# Patient Record
Sex: Male | Born: 1945 | Race: Black or African American | Hispanic: No | Marital: Married | Smoking: Current every day smoker
Health system: Southern US, Community
[De-identification: ages and names within clinical notes are randomized; demographics above are authoritative.]

## PROBLEM LIST (undated history)

## (undated) DIAGNOSIS — H9190 Unspecified hearing loss, unspecified ear: Secondary | ICD-10-CM

## (undated) DIAGNOSIS — I82402 Acute embolism and thrombosis of unspecified deep veins of left lower extremity: Secondary | ICD-10-CM

## (undated) DIAGNOSIS — R1903 Right lower quadrant abdominal swelling, mass and lump: Secondary | ICD-10-CM

## (undated) DIAGNOSIS — E559 Vitamin D deficiency, unspecified: Secondary | ICD-10-CM

## (undated) DIAGNOSIS — I70219 Atherosclerosis of native arteries of extremities with intermittent claudication, unspecified extremity: Secondary | ICD-10-CM

## (undated) DIAGNOSIS — F17210 Nicotine dependence, cigarettes, uncomplicated: Secondary | ICD-10-CM

## (undated) DIAGNOSIS — E785 Hyperlipidemia, unspecified: Secondary | ICD-10-CM

## (undated) DIAGNOSIS — Z972 Presence of dental prosthetic device (complete) (partial): Secondary | ICD-10-CM

## (undated) DIAGNOSIS — I739 Peripheral vascular disease, unspecified: Secondary | ICD-10-CM

## (undated) DIAGNOSIS — D649 Anemia, unspecified: Secondary | ICD-10-CM

## (undated) DIAGNOSIS — R9431 Abnormal electrocardiogram [ECG] [EKG]: Secondary | ICD-10-CM

## (undated) HISTORY — DX: Peripheral vascular disease, unspecified: I73.9

## (undated) HISTORY — DX: Acute embolism and thrombosis of unspecified deep veins of left lower extremity: I82.402

## (undated) HISTORY — PX: EYE SURGERY: SHX253

## (undated) HISTORY — DX: Vitamin D deficiency, unspecified: E55.9

## (undated) HISTORY — DX: Unspecified hearing loss, unspecified ear: H91.90

## (undated) HISTORY — DX: Hyperlipidemia, unspecified: E78.5

---

## 1988-03-27 HISTORY — PX: BACK SURGERY: SHX140

## 2004-04-14 ENCOUNTER — Ambulatory Visit: Payer: Self-pay | Admitting: Family Medicine

## 2004-04-16 ENCOUNTER — Emergency Department: Payer: Self-pay | Admitting: Emergency Medicine

## 2004-06-14 ENCOUNTER — Ambulatory Visit: Payer: Self-pay | Admitting: Pain Medicine

## 2004-06-22 ENCOUNTER — Ambulatory Visit: Payer: Self-pay | Admitting: Pain Medicine

## 2004-07-22 ENCOUNTER — Emergency Department: Payer: Self-pay | Admitting: Emergency Medicine

## 2004-07-26 ENCOUNTER — Ambulatory Visit: Payer: Self-pay | Admitting: Pain Medicine

## 2004-08-08 ENCOUNTER — Ambulatory Visit: Payer: Self-pay | Admitting: Pain Medicine

## 2004-09-20 ENCOUNTER — Ambulatory Visit: Payer: Self-pay | Admitting: Pain Medicine

## 2004-09-28 ENCOUNTER — Ambulatory Visit: Payer: Self-pay | Admitting: Pain Medicine

## 2004-11-10 ENCOUNTER — Ambulatory Visit: Payer: Self-pay | Admitting: Pain Medicine

## 2004-11-16 ENCOUNTER — Ambulatory Visit: Payer: Self-pay | Admitting: Pain Medicine

## 2005-11-05 ENCOUNTER — Emergency Department: Payer: Self-pay | Admitting: Emergency Medicine

## 2005-11-05 ENCOUNTER — Other Ambulatory Visit: Payer: Self-pay

## 2007-03-28 HISTORY — PX: OTHER SURGICAL HISTORY: SHX169

## 2007-03-28 HISTORY — PX: VASCULAR SURGERY: SHX849

## 2007-09-23 ENCOUNTER — Emergency Department: Payer: Self-pay

## 2007-09-23 ENCOUNTER — Other Ambulatory Visit: Payer: Self-pay

## 2007-09-25 ENCOUNTER — Emergency Department: Payer: Self-pay | Admitting: Unknown Physician Specialty

## 2007-10-23 ENCOUNTER — Ambulatory Visit: Payer: Self-pay | Admitting: Vascular Surgery

## 2007-11-05 ENCOUNTER — Ambulatory Visit: Payer: Self-pay | Admitting: Vascular Surgery

## 2007-11-11 ENCOUNTER — Inpatient Hospital Stay: Payer: Self-pay | Admitting: Vascular Surgery

## 2008-09-15 ENCOUNTER — Emergency Department: Payer: Self-pay | Admitting: Emergency Medicine

## 2009-12-27 ENCOUNTER — Ambulatory Visit: Payer: Self-pay | Admitting: Vascular Surgery

## 2010-01-25 HISTORY — PX: COLONOSCOPY: SHX174

## 2010-01-27 ENCOUNTER — Ambulatory Visit: Payer: Self-pay | Admitting: Gastroenterology

## 2010-07-05 ENCOUNTER — Ambulatory Visit: Payer: Self-pay | Admitting: Gastroenterology

## 2011-07-18 ENCOUNTER — Emergency Department: Payer: Self-pay | Admitting: Emergency Medicine

## 2011-07-18 LAB — COMPREHENSIVE METABOLIC PANEL
Alkaline Phosphatase: 66 U/L (ref 50–136)
Anion Gap: 11 (ref 7–16)
Bilirubin,Total: 1.2 mg/dL — ABNORMAL HIGH (ref 0.2–1.0)
Chloride: 103 mmol/L (ref 98–107)
Co2: 25 mmol/L (ref 21–32)
SGOT(AST): 27 U/L (ref 15–37)
Sodium: 139 mmol/L (ref 136–145)

## 2011-07-18 LAB — URINALYSIS, COMPLETE
Bacteria: NONE SEEN
Bilirubin,UR: NEGATIVE
Glucose,UR: NEGATIVE mg/dL (ref 0–75)
Hyaline Cast: 31
Ketone: NEGATIVE
Leukocyte Esterase: NEGATIVE
Nitrite: NEGATIVE
Ph: 5 (ref 4.5–8.0)
Protein: 30
RBC,UR: 7 /HPF (ref 0–5)
Specific Gravity: 1.029 (ref 1.003–1.030)
WBC UR: 2 /HPF (ref 0–5)

## 2011-07-18 LAB — CBC
HCT: 53.1 % — ABNORMAL HIGH (ref 40.0–52.0)
HGB: 17.5 g/dL (ref 13.0–18.0)
MCH: 32.2 pg (ref 26.0–34.0)
MCHC: 33 g/dL (ref 32.0–36.0)
RBC: 5.45 10*6/uL (ref 4.40–5.90)
RDW: 13.7 % (ref 11.5–14.5)

## 2011-07-18 LAB — TROPONIN I: Troponin-I: <0.02 ng/mL

## 2011-07-18 LAB — CK TOTAL AND CKMB (NOT AT ARMC): CK, Total: 119 U/L (ref 35–232)

## 2014-12-14 ENCOUNTER — Other Ambulatory Visit: Payer: Self-pay | Admitting: Family Medicine

## 2014-12-14 DIAGNOSIS — T148XXA Other injury of unspecified body region, initial encounter: Secondary | ICD-10-CM

## 2017-06-14 DIAGNOSIS — I739 Peripheral vascular disease, unspecified: Secondary | ICD-10-CM | POA: Insufficient documentation

## 2017-06-18 ENCOUNTER — Encounter: Payer: Self-pay | Admitting: *Deleted

## 2017-06-26 ENCOUNTER — Ambulatory Visit: Payer: Self-pay | Admitting: General Surgery

## 2017-06-27 ENCOUNTER — Encounter: Payer: Self-pay | Admitting: *Deleted

## 2017-07-20 ENCOUNTER — Other Ambulatory Visit (INDEPENDENT_AMBULATORY_CARE_PROVIDER_SITE_OTHER): Payer: Self-pay | Admitting: Family Medicine

## 2017-07-20 DIAGNOSIS — I739 Peripheral vascular disease, unspecified: Secondary | ICD-10-CM

## 2017-07-24 ENCOUNTER — Encounter (INDEPENDENT_AMBULATORY_CARE_PROVIDER_SITE_OTHER): Payer: Medicare HMO

## 2017-07-24 ENCOUNTER — Encounter (INDEPENDENT_AMBULATORY_CARE_PROVIDER_SITE_OTHER): Payer: Medicare HMO | Admitting: Vascular Surgery

## 2017-08-25 HISTORY — PX: PTCA: SHX146

## 2017-09-04 ENCOUNTER — Ambulatory Visit (INDEPENDENT_AMBULATORY_CARE_PROVIDER_SITE_OTHER): Payer: Medicare HMO | Admitting: Vascular Surgery

## 2017-09-04 ENCOUNTER — Ambulatory Visit (INDEPENDENT_AMBULATORY_CARE_PROVIDER_SITE_OTHER): Payer: Medicare HMO

## 2017-09-04 ENCOUNTER — Other Ambulatory Visit (INDEPENDENT_AMBULATORY_CARE_PROVIDER_SITE_OTHER): Payer: Self-pay | Admitting: Vascular Surgery

## 2017-09-04 ENCOUNTER — Encounter (INDEPENDENT_AMBULATORY_CARE_PROVIDER_SITE_OTHER): Payer: Self-pay | Admitting: Vascular Surgery

## 2017-09-04 VITALS — BP 126/76 | HR 64 | Resp 12 | Ht 65.5 in | Wt 128.0 lb

## 2017-09-04 DIAGNOSIS — E785 Hyperlipidemia, unspecified: Secondary | ICD-10-CM | POA: Diagnosis not present

## 2017-09-04 DIAGNOSIS — F17210 Nicotine dependence, cigarettes, uncomplicated: Secondary | ICD-10-CM | POA: Diagnosis not present

## 2017-09-04 DIAGNOSIS — I70219 Atherosclerosis of native arteries of extremities with intermittent claudication, unspecified extremity: Secondary | ICD-10-CM

## 2017-09-04 DIAGNOSIS — I739 Peripheral vascular disease, unspecified: Secondary | ICD-10-CM | POA: Diagnosis not present

## 2017-09-04 DIAGNOSIS — F17200 Nicotine dependence, unspecified, uncomplicated: Secondary | ICD-10-CM | POA: Insufficient documentation

## 2017-09-04 NOTE — Assessment & Plan Note (Signed)
Statin was recommended by his primary care physician but he will not take them. lipid control important in reducing the progression of atherosclerotic disease.

## 2017-09-04 NOTE — Patient Instructions (Signed)

## 2017-09-04 NOTE — Progress Notes (Signed)
Patient ID: Jerry [name], male   DOB: 1945/12/27, 72 y.o.   MRN: [id number]  Chief Complaint  Patient presents with  . New Patient (Initial Visit)    Claudication, ABI Consult    HPI Jerry [name] is a 72 y.o. male.  I am asked to see the patient by [name] for evaluation of PAD.  The patient reports worsening left lower extremity claudication symptoms over several months.  He says he had this problem 5 to 10 years ago and had surgery which relieved his left leg symptoms at that time.  He does continue to smoke.  He does not describe rest pain or ulceration currently.  His pain comes on with short distances of walking and is generally relieved with rest.  No right leg symptoms.  There is no clear inciting event or causative factor that seem to worsen his symptoms but they have clearly worsened over the past several months.  Nothing is really making them better.   Past Medical History:  Diagnosis Date  . Hearing loss   . Hyperlipidemia   . Peripheral vascular disease ([place])   . Vitamin D deficiency     PSH Fem fem bypass 5-10 years ago Port placement  Family History No bleeding disorders, clotting disorders, autoimmune diseases, or aneurysms.  Social History Social History   Tobacco Use  . Smoking status: Current Every Day Smoker  . Smokeless tobacco: Never Used  Substance Use Topics  . Alcohol use: Never    Frequency: Never  . Drug use: Not on file    No Known Allergies  No current outpatient medications on file.   No current facility-administered medications for this visit.   Patient says he won't take any medications.  Says they all cause side effects and "mess him up"    REVIEW OF SYSTEMS (Negative unless checked)  Constitutional: [] Weight loss  [] Fever  [] Chills Cardiac: [] Chest pain   [] Chest pressure   [] Palpitations   [] Shortness of breath when laying flat   [] Shortness of breath at rest   [] Shortness of breath with exertion. Vascular:  [x] Pain in legs  with walking   [] Pain in legs at rest   [] Pain in legs when laying flat   [x] Claudication   [] Pain in feet when walking  [] Pain in feet at rest  [] Pain in feet when laying flat   [] History of DVT   [] Phlebitis   [] Swelling in legs   [] Varicose veins   [] Non-healing ulcers Pulmonary:   [] Uses home oxygen   [] Productive cough   [] Hemoptysis   [] Wheeze  [] COPD   [] Asthma Neurologic:  [] Dizziness  [] Blackouts   [] Seizures   [] History of stroke   [] History of TIA  [] Aphasia   [] Temporary blindness   [] Dysphagia   [] Weakness or numbness in arms   [] Weakness or numbness in legs Musculoskeletal:  [x] Arthritis   [] Joint swelling   [x] Joint pain   [] Low back pain Hematologic:  [] Easy bruising  [] Easy bleeding   [] Hypercoagulable state   [] Anemic  [] Hepatitis Gastrointestinal:  [] Blood in stool   [] Vomiting blood  [] Gastroesophageal reflux/heartburn   [] Abdominal pain Genitourinary:  [] Chronic kidney disease   [] Difficult urination  [] Frequent urination  [] Burning with urination   [] Hematuria Skin:  [] Rashes   [] Ulcers   [] Wounds Psychological:  [] History of anxiety   []  History of major depression.    Physical Exam BP 126/76 (BP Location: Right Arm, Patient Position: Sitting)   Pulse 64   Resp 12  Ht 5' 5.5" (1.664 m)   Wt 128 lb (58.1 kg)   BMI 20.98 kg/m  Gen:  WD/WN, NAD Head: [place]/AT, No temporalis wasting.  Ear/Nose/Throat: Hearing decreased, nares w/o erythema or drainage, oropharynx w/o Erythema/Exudate Eyes: Conjunctiva clear, sclera non-icteric  Neck: trachea midline.  Pulmonary:  Good air movement, respirations not labored, no use of accessory muscles Cardiac: RRR, no JVD Vascular:  Vessel Right Left  Radial Palpable Palpable                  Femoral Palpable Not Palpable  Popliteal Palpable Not Palpable  PT Palpable 1+ Palpable  DP Palpable Trace Palpable   Gastrointestinal: soft, non-tender/non-distended.  Musculoskeletal: M/S 5/5 throughout.  No deformity or atrophy. Trace  LLE edema. Neurologic: Sensation grossly intact in extremities.  Symmetrical.  Speech is fluent. Motor exam as listed above. Psychiatric: Judgment intact, Mood & affect appropriate for pt's clinical situation. Dermatologic: No rashes or ulcers noted.  No cellulitis or open wounds.  Radiology No results found.  Labs No results found for this or any previous visit (from the past 2160 hour(s)).  Assessment/Plan:  Hyperlipidemia Statin was recommended by his primary care physician but he will not take them. lipid control important in reducing the progression of atherosclerotic disease.    Tobacco use disorder We had a discussion for approximately 3 minutes regarding the absolute need for smoking cessation due to the deleterious nature of tobacco on the vascular system. We discussed the tobacco use would diminish patency of any intervention, and likely significantly worsen progressio of disease. We discussed multiple agents for quitting including replacement therapy or medications to reduce cravings such as Chantix. The patient voices their understanding of the importance of smoking cessation.   Atherosclerosis of lower extremity with claudication [facility]) The patient describes lifestyle limiting claudication of the left lower extremity.  We discussed that he is not in the immediate limb threat and does not have immediate limb threatening symptoms.  He is studied with noninvasive studies today which show a normal ABI of 1 on the right and a reduced ABI on the 0.7 range on the left with monophasic waveforms.  A spot duplex shows his femoral to femoral bypass is occluded as is likely his left iliac system.  I had a long discussion with the patient today.  I discussed that if we do any intervention, he will be needed to take medications for at least several months and should be on an aspirin lifelong.  I recommended smoking cessation which he does not seem interested in.  I have discussed options for  treatment and he would like to have an angiogram for further evaluation as well as potential revascularization.  I discussed the risks and benefits of the procedures.  I have discussed that intervention could actually make his symptoms worse if it reoccludes.  He voices his understanding but desires to proceed with evaluation for revascularization given the severe nature of his symptoms which is reasonable.      [name] 09/04/2017, 5:03 PM   This note was created with Dragon medical transcription system.  Any errors from dictation are unintentional.

## 2017-09-04 NOTE — Assessment & Plan Note (Signed)
The patient describes lifestyle limiting claudication of the left lower extremity.  We discussed that he is not in the immediate limb threat and does not have immediate limb threatening symptoms.  He is studied with noninvasive studies today which show a normal ABI of 1 on the right and a reduced ABI on the 0.7 range on the left with monophasic waveforms.  A spot duplex shows his femoral to femoral bypass is occluded as is likely his left iliac system.  I had a long discussion with the patient today.  I discussed that if we do any intervention, he will be needed to take medications for at least several months and should be on an aspirin lifelong.  I recommended smoking cessation which he does not seem interested in.  I have discussed options for treatment and he would like to have an angiogram for further evaluation as well as potential revascularization.  I discussed the risks and benefits of the procedures.  I have discussed that intervention could actually make his symptoms worse if it reoccludes.  He voices his understanding but desires to proceed with evaluation for revascularization given the severe nature of his symptoms which is reasonable.

## 2017-09-04 NOTE — Assessment & Plan Note (Signed)

## 2017-09-07 ENCOUNTER — Encounter (INDEPENDENT_AMBULATORY_CARE_PROVIDER_SITE_OTHER): Payer: Self-pay

## 2017-09-10 ENCOUNTER — Other Ambulatory Visit (INDEPENDENT_AMBULATORY_CARE_PROVIDER_SITE_OTHER): Payer: Self-pay | Admitting: Vascular Surgery

## 2017-09-13 ENCOUNTER — Encounter: Payer: Self-pay | Admitting: General Surgery

## 2017-09-13 ENCOUNTER — Ambulatory Visit (INDEPENDENT_AMBULATORY_CARE_PROVIDER_SITE_OTHER): Payer: Medicare HMO | Admitting: General Surgery

## 2017-09-13 VITALS — BP 110/78 | HR 80 | Resp 15 | Ht 65.5 in | Wt 127.0 lb

## 2017-09-13 DIAGNOSIS — R1903 Right lower quadrant abdominal swelling, mass and lump: Secondary | ICD-10-CM | POA: Diagnosis not present

## 2017-09-13 NOTE — Progress Notes (Signed)
Patient ID: Jerry [name], male   DOB: [birth year], 72 y.o.   MRN: [id number]  Chief Complaint  Patient presents with  . Abdominal Pain    HPI KEISHAUN [name] is a 72 y.o. male.  Here today for evaluation of an right abdominal knot referred by Dr Delight Stare. He has noticed the knot a few months ago. He states he only has pain when it pushes out. He states it is about the size of an egg, he rubs the area and it goes down. No particular incident when he noticed the knot. He states the knot was there on his way to the appointment but now it is gone. Bowels move every other day, no bleeding. He states his appetite is less than it was. He may eat just one meal a day. He noticed this about the same time he noticed the knot.  HPI  Past Medical History:  Diagnosis Date  . Hearing loss   . Hyperlipidemia   . Peripheral vascular disease ([place])   . Vitamin D deficiency     Past Surgical History:  Procedure Laterality Date  . BACK SURGERY     Three  . COLONOSCOPY  01/2010  . Left Leg stent Left 2017?   fem fem bypass    Family History  Problem Relation Age of Onset  . Colon cancer Neg Hx     Social History Social History   Tobacco Use  . Smoking status: Current Every Day Smoker    Packs/day: 0.50    Years: 40.00    Pack years: 20.00    Types: Cigarettes  . Smokeless tobacco: Never Used  Substance Use Topics  . Alcohol use: Never    Frequency: Never  . Drug use: Not on file    No Known Allergies  Current Outpatient Medications  Medication Sig Dispense Refill  . aspirin EC 81 MG tablet Take 81 mg by mouth daily.    . Multiple Vitamin (MULTIVITAMIN) capsule Take 1 capsule by mouth as needed.      No current facility-administered medications for this visit.     Review of Systems Review of Systems  Constitutional: Negative.   Respiratory: Negative.   Cardiovascular: Negative.     Blood pressure 110/78, pulse 80, resp. rate 15, height 5' 5.5" (1.664 m), weight 127 lb (57.6  kg). No weight change between March and June 2019.  Physical Exam Physical Exam  Constitutional: He is oriented to person, place, and time. He appears well-developed and well-nourished.  HENT:  Mouth/Throat: No oropharyngeal exudate.  Eyes: Conjunctivae are normal. No scleral icterus.  Neck: Neck supple.  Cardiovascular: Normal rate and regular rhythm.  Murmur heard.  Systolic murmur is present with a grade of 3/6. Pulmonary/Chest: Effort normal and breath sounds normal.  Abdominal: Soft. Normal appearance and bowel sounds are normal. He exhibits no distension and no ascites.    Neurological: He is alert and oriented to person, place, and time.  Skin: Skin is warm and dry.  Psychiatric: His behavior is normal.    Data Reviewed Laboratory studies dated June 14, 2017 showed a creatinine of 1.18 with an estimated GFR of 70.  Normal electrolytes.  High serum albumin of 5.1.  Total bilirubin 1.3.  Alkaline phosphatase and transaminases normal.  Normal TSH at 0.87.  PSA: 3.0.  Assessment    Unusual report of abdominal mass waxing and waning in location atypical for hernia formation.  Within the limits of exam, I cannot appreciate an intra-abdominal mass,  but this could be obscured as he still has significant muscle mass on the anterior abdominal wall.    Plan    CT abdomen pelvis with contrast will be requested to help will be requested to help determine if any intra-abdominal process is present to account for the patient's present reported physical changes.    HPI, Physical Exam, Assessment and Plan have been scribed under the direction and in the presence of Robert Bellow, MD. Karie Fetch, RN  I have completed the exam and reviewed the above documentation for accuracy and completeness.  I agree with the above.  Haematologist has been used and any errors in dictation or transcription are unintentional.  Hervey Ard, M.D., F.A.C.S.  The patient is scheduled for a CT of  the abdomen and pelvis on 09/20/17 at 9:30 am at [facility]. He will pick up a prep kit prior to that. He is to arrive by 9:15 am and have nothing to eat or drink for 4 hours prior. The patient is aware of date, time,and instructions.  Documented by Caryl-Lyn Otis Brace LPN  Forest Gleason Emanuele Mcwhirter 09/14/2017, 10:07 AM

## 2017-09-13 NOTE — Patient Instructions (Addendum)
CT abdomen and pelvis with contrast scan The patient is aware to call back for any questions or concerns.  The patient is scheduled for a CT of the abdomen and pelvis on 09/20/17 at 9:30 am at [facility]. He will pick up a prep kit prior to that. He is to arrive by 9:15 am and have nothing to eat or drink for 4 hours prior. The patient is aware of date, time,and instructions.

## 2017-09-14 DIAGNOSIS — R634 Abnormal weight loss: Secondary | ICD-10-CM | POA: Insufficient documentation

## 2017-09-14 DIAGNOSIS — R1903 Right lower quadrant abdominal swelling, mass and lump: Secondary | ICD-10-CM | POA: Insufficient documentation

## 2017-09-17 ENCOUNTER — Inpatient Hospital Stay: Admission: RE | Admit: 2017-09-17 | Payer: Medicare HMO | Source: Ambulatory Visit

## 2017-09-19 ENCOUNTER — Other Ambulatory Visit
Admission: RE | Admit: 2017-09-19 | Discharge: 2017-09-19 | Disposition: A | Discharge status: Home / Self Care | Payer: Medicare HMO | Source: Ambulatory Visit | Attending: Vascular Surgery | Admitting: Vascular Surgery

## 2017-09-19 ENCOUNTER — Ambulatory Visit
Admission: RE | Admit: 2017-09-19 | Discharge: 2017-09-19 | Disposition: A | Discharge status: Home / Self Care | Payer: Medicare HMO | Source: Ambulatory Visit | Attending: Vascular Surgery | Admitting: Vascular Surgery

## 2017-09-19 ENCOUNTER — Encounter
Admission: RE | Disposition: A | Discharge status: Home / Self Care | Payer: Self-pay | Source: Ambulatory Visit | Attending: Vascular Surgery

## 2017-09-19 DIAGNOSIS — H9190 Unspecified hearing loss, unspecified ear: Secondary | ICD-10-CM | POA: Insufficient documentation

## 2017-09-19 DIAGNOSIS — I70219 Atherosclerosis of native arteries of extremities with intermittent claudication, unspecified extremity: Secondary | ICD-10-CM

## 2017-09-19 DIAGNOSIS — Z9889 Other specified postprocedural states: Secondary | ICD-10-CM | POA: Diagnosis not present

## 2017-09-19 DIAGNOSIS — E559 Vitamin D deficiency, unspecified: Secondary | ICD-10-CM | POA: Insufficient documentation

## 2017-09-19 DIAGNOSIS — I739 Peripheral vascular disease, unspecified: Secondary | ICD-10-CM | POA: Diagnosis not present

## 2017-09-19 DIAGNOSIS — E785 Hyperlipidemia, unspecified: Secondary | ICD-10-CM | POA: Insufficient documentation

## 2017-09-19 DIAGNOSIS — Z01812 Encounter for preprocedural laboratory examination: Secondary | ICD-10-CM | POA: Diagnosis present

## 2017-09-19 DIAGNOSIS — I70212 Atherosclerosis of native arteries of extremities with intermittent claudication, left leg: Secondary | ICD-10-CM | POA: Diagnosis not present

## 2017-09-19 DIAGNOSIS — F17200 Nicotine dependence, unspecified, uncomplicated: Secondary | ICD-10-CM | POA: Diagnosis not present

## 2017-09-19 HISTORY — PX: LOWER EXTREMITY ANGIOGRAPHY: CATH118251

## 2017-09-19 LAB — CREATININE, SERUM
Creatinine, Ser: 1 mg/dL (ref 0.61–1.24)
GFR calc Af Amer: >60 mL/min (ref >60)
GFR calc non Af Amer: >60 mL/min (ref >60)

## 2017-09-19 LAB — BUN: BUN: 20 mg/dL (ref 8–23)

## 2017-09-19 SURGERY — LOWER EXTREMITY ANGIOGRAPHY
Anesthesia: Moderate Sedation | Laterality: Left

## 2017-09-19 MED ORDER — METHYLPREDNISOLONE SODIUM SUCC 125 MG IJ SOLR: 125.0000 mg | INTRAMUSCULAR | Status: DC | PRN

## 2017-09-19 MED ORDER — FENTANYL CITRATE (PF) 100 MCG/2ML IJ SOLN
INTRAMUSCULAR | Status: AC
  Filled 2017-09-19: qty 2

## 2017-09-19 MED ORDER — HEPARIN (PORCINE) IN NACL 1000-0.9 UT/500ML-% IV SOLN
INTRAVENOUS | Status: AC
  Filled 2017-09-19: qty 1000

## 2017-09-19 MED ORDER — ONDANSETRON HCL 4 MG/2ML IJ SOLN: 4.0000 mg | Freq: Four times a day (QID) | INTRAMUSCULAR | Status: DC | PRN

## 2017-09-19 MED ORDER — LIDOCAINE-EPINEPHRINE (PF) 1 %-1:200000 IJ SOLN
INTRAMUSCULAR | Status: AC
  Filled 2017-09-19: qty 30

## 2017-09-19 MED ORDER — SODIUM CHLORIDE 0.9% FLUSH: 3.0000 mL | INTRAVENOUS | Status: DC | PRN

## 2017-09-19 MED ORDER — SODIUM CHLORIDE 0.9 % IV SOLN: INTRAVENOUS | Status: DC

## 2017-09-19 MED ORDER — HEPARIN SODIUM (PORCINE) 1000 UNIT/ML IJ SOLN
INTRAMUSCULAR | Status: AC
  Filled 2017-09-19: qty 1

## 2017-09-19 MED ORDER — HYDRALAZINE HCL 20 MG/ML IJ SOLN: 5.0000 mg | INTRAMUSCULAR | Status: DC | PRN

## 2017-09-19 MED ORDER — HYDROMORPHONE HCL 1 MG/ML IJ SOLN: 1.0000 mg | Freq: Once | INTRAMUSCULAR | Status: DC | PRN

## 2017-09-19 MED ORDER — MIDAZOLAM HCL 5 MG/5ML IJ SOLN
INTRAMUSCULAR | Status: AC
  Filled 2017-09-19: qty 5

## 2017-09-19 MED ORDER — ACETAMINOPHEN 325 MG PO TABS: 650.0000 mg | ORAL_TABLET | ORAL | Status: DC | PRN

## 2017-09-19 MED ORDER — IOPAMIDOL (ISOVUE-300) INJECTION 61%
INTRAVENOUS | Status: DC | PRN
  Administered 2017-09-19: 80 mL via INTRA_ARTERIAL

## 2017-09-19 MED ORDER — SODIUM CHLORIDE 0.9% FLUSH: 3.0000 mL | Freq: Two times a day (BID) | INTRAVENOUS | Status: DC

## 2017-09-19 MED ORDER — FAMOTIDINE 20 MG PO TABS: 40.0000 mg | ORAL_TABLET | ORAL | Status: DC | PRN

## 2017-09-19 MED ORDER — CEFAZOLIN SODIUM-DEXTROSE 2-4 GM/100ML-% IV SOLN
2.0000 g | Freq: Once | INTRAVENOUS | Status: AC
  Administered 2017-09-19: 2 g via INTRAVENOUS

## 2017-09-19 MED ORDER — CEFAZOLIN SODIUM-DEXTROSE 2-4 GM/100ML-% IV SOLN
INTRAVENOUS | Status: AC
  Administered 2017-09-19: 2 g via INTRAVENOUS
  Filled 2017-09-19: qty 100

## 2017-09-19 MED ORDER — ATORVASTATIN CALCIUM 10 MG PO TABS: 10.0000 mg | ORAL_TABLET | Freq: Every day | ORAL | 11 refills | Status: DC

## 2017-09-19 MED ORDER — SODIUM CHLORIDE 0.9 % IV SOLN: 250.0000 mL | INTRAVENOUS | Status: DC | PRN

## 2017-09-19 MED ORDER — HEPARIN SODIUM (PORCINE) 1000 UNIT/ML IJ SOLN
INTRAMUSCULAR | Status: DC | PRN
  Administered 2017-09-19: 4000 [IU] via INTRAVENOUS

## 2017-09-19 MED ORDER — MIDAZOLAM HCL 2 MG/2ML IJ SOLN
INTRAMUSCULAR | Status: DC | PRN
  Administered 2017-09-19: 1 mg via INTRAVENOUS
  Administered 2017-09-19: 2 mg via INTRAVENOUS
  Administered 2017-09-19: 1 mg via INTRAVENOUS

## 2017-09-19 MED ORDER — LABETALOL HCL 5 MG/ML IV SOLN: 10.0000 mg | INTRAVENOUS | Status: DC | PRN

## 2017-09-19 MED ORDER — SODIUM CHLORIDE 0.9 % IV SOLN
INTRAVENOUS | Status: DC
  Administered 2017-09-19: 12:00:00 via INTRAVENOUS

## 2017-09-19 MED ORDER — FENTANYL CITRATE (PF) 100 MCG/2ML IJ SOLN
INTRAMUSCULAR | Status: DC | PRN
  Administered 2017-09-19 (×3): 50 ug via INTRAVENOUS

## 2017-09-19 SURGICAL SUPPLY — 16 items
BALLN LUTONIX AV 6X60X75 (BALLOONS) ×2
BALLOON LUTONIX AV 6X60X75 (BALLOONS) IMPLANT
CANNULA 5F STIFF (CANNULA) ×1 IMPLANT
CATH BEACON 5 .035 65 KMP TIP (CATHETERS) ×1 IMPLANT
CATH BEACON 5 .035 65 RIM TIP (CATHETERS) ×1 IMPLANT
CATH PIG 70CM (CATHETERS) ×1 IMPLANT
CATH VS15FR (CATHETERS) ×1 IMPLANT
DEVICE PRESTO INFLATION (MISCELLANEOUS) ×1 IMPLANT
DEVICE STARCLOSE SE CLOSURE (Vascular Products) ×1 IMPLANT
DEVICE TORQUE .025-.038 (MISCELLANEOUS) ×1 IMPLANT
GLIDEWIRE ADV .035X180CM (WIRE) ×1 IMPLANT
PACK ANGIOGRAPHY (CUSTOM PROCEDURE TRAY) ×2 IMPLANT
SHEATH BRITE TIP 5FRX11 (SHEATH) ×1 IMPLANT
SYR MEDRAD MARK V 150ML (SYRINGE) ×1 IMPLANT
TUBING CONTRAST HIGH PRESS 72 (TUBING) ×1 IMPLANT
WIRE J 3MM .035X145CM (WIRE) ×1 IMPLANT

## 2017-09-19 NOTE — Op Note (Signed)
[facility]  Percutaneous Study/Intervention Procedural Note   Date of Surgery: 09/19/2017  Surgeon(s):Kemontae Dunklee    Assistants:none  Pre-operative Diagnosis: PAD with claudication predominantly in the left lower extremity  Post-operative diagnosis:  Same  Procedure(s) Performed:             1.  Ultrasound guidance for vascular access right femoral artery             2.  Catheter placement into left internal and external iliac arteries from right femoral approach             3.  Aortogram and selective left lower extremity angiogram             4.  Percutaneous transluminal angioplasty of right external iliac artery with 6 mm diameter by 6 cm length Lutonix drug-coated angioplasty balloon             5.  StarClose closure device right femoral artery  EBL: 10 cc  Contrast: 80 cc  Fluoro Time: 17.3 minutes  Moderate Conscious Sedation Time: approximately 50 minutes using 4 mg of Versed and 150 Mcg of Fentanyl              Indications:  Patient is a 72 y.o.male with a long history of peripheral arterial disease.  He had a previous femoral to femoral bypass several years ago.  He now has recurrent lifestyle limiting left leg claudication. The patient has noninvasive study showing occlusion of his femoral to femoral bypass with significantly reduced left ABI. The patient is brought in for angiography for further evaluation and potential treatment.  Risks and benefits are discussed and informed consent is obtained.   Procedure:  The patient was identified and appropriate procedural time out was performed.  The patient was then placed supine on the table and prepped and draped in the usual sterile fashion. Moderate conscious sedation was administered during a face to face encounter with the patient throughout the procedure with my supervision of the RN administering medicines and monitoring the patient's vital signs, pulse oximetry, telemetry and mental status  throughout from the start of the procedure until the patient was taken to the recovery room. Ultrasound was used to evaluate the right common femoral artery.  It was patent .  A digital ultrasound image was acquired.  A Seldinger needle was used to access the right common femoral artery under direct ultrasound guidance and a permanent image was performed.  A 0.035 J wire was advanced without resistance and a 5Fr sheath was placed.  Pigtail catheter was placed into the aorta and an AP aortogram was performed.  Pelvic obliques were also performed to help opacify the iliacs particularly given his spinal hardware making imaging difficult.  This demonstrated normal renal arteries with a very calcified but not stenotic aorta.  The right common iliac artery had mild to moderate stenosis approaching 50%.  The right external iliac artery had about a 70 to 75% stenosis.  There also appeared to be significant disease at the right femoral bifurcation both in the proximal SFA and profunda femoris artery just below the access site at the femoral bifurcation.  On the left, the left common iliac artery was heavily calcific but not stenotic.  The left internal iliac artery was large providing collaterals around an occlusion of the left external iliac artery.  This did reconstitute a diseased left common femoral artery which appeared highly stenotic at and just above the bifurcation of the left femoral artery.  I then went ahead and image the left lower extremity from this approach.  This demonstrated significant disease of the left common femoral artery that appeared to be greater than 80% at and just above the femoral bifurcation.  The SFA was then reasonably normal after its origin as was the popliteal artery.  There appeared to be two-vessel runoff distally with the posterior tibial artery being the best vessel although distal opacification was quite poor due to the slow flow time of the patient's continuous motion.  The patient  was given 4000 units of intravenous heparin.  I then elected to perform angioplasty of the right external iliac artery to treat this lesion as well as to improve her chances of getting up and over the bifurcation to treat the left external iliac occlusion.  A 6 mm diameter by 6 cm length Lutonix drug-coated angioplasty balloon was inflated to 10 atm for 1 minute and the right external iliac artery.  Completion angiogram showed a 40 to 50% residual stenosis.  I then used multiple catheters and wires attempting to cross the left iliac occlusion.  I used a rim catheter, a VS 1 catheter, and a Kumpe catheter.  Each time, catheters would easily gain access to the left internal iliac artery to get purchase, but I was unable to go beyond the first 1 to 2 cm of the left external iliac artery and get through the occlusion.  The catheters were buckled back and this was a chronic, densely calcified occlusion.  At this point it was reasonably clear that this was not going to be a successful endeavor and crossing the left iliac lesion from the right side.  In addition, the left common femoral disease would need to be addressed and this would be best managed surgically.  Consideration for a left femoral endarterectomy with either a redo femoral to femoral bypass or a left iliac intervention would be planned going forward.  I elected to terminate the procedure. The sheath was removed and StarClose closure device was deployed in the right femoral artery with excellent hemostatic result. The patient was taken to the recovery room in stable condition having tolerated the procedure well.  Findings:               Aortogram:  This demonstrated normal renal arteries with a very calcified but not stenotic aorta.  The right common iliac artery had mild to moderate stenosis approaching 50%.  The right external iliac artery had about a 70 to 75% stenosis.  There also appeared to be significant disease at the right femoral bifurcation both  in the proximal SFA and profunda femoris artery just below the access site at the femoral bifurcation.  On the left, the left common iliac artery was heavily calcific but not stenotic.  The left internal iliac artery was large providing collaterals around an occlusion of the left external iliac artery.  This did reconstitute a diseased left common femoral artery which appeared highly stenotic at and just above the bifurcation of the left femoral artery.             Left lower Extremity:  Significant disease of the left common femoral artery that appeared to be greater than 80% at and just above the femoral bifurcation.  The SFA was then reasonably normal after its origin as was the popliteal artery.  There appeared to be two-vessel runoff distally with the posterior tibial artery being the best vessel although distal opacification was quite poor due to the slow  flow time of the patient's continuous motion   Disposition: Patient was taken to the recovery room in stable condition having tolerated the procedure well.  Complications: None  Jerry [name] 09/19/2017 2:34 PM   This note was created with Dragon Medical transcription system. Any errors in dictation are purely unintentional.

## 2017-09-19 NOTE — Progress Notes (Signed)
Dr. Lucky Cowboy by pt. Bedside to speak with pt. And spouse re: results of procedure. Both verbalized understanding. Message left with office to schedule return office appt. For pt.

## 2017-09-20 ENCOUNTER — Encounter: Payer: Self-pay | Admitting: Vascular Surgery

## 2017-09-20 ENCOUNTER — Ambulatory Visit: Payer: Medicare HMO

## 2017-09-20 ENCOUNTER — Encounter (INDEPENDENT_AMBULATORY_CARE_PROVIDER_SITE_OTHER): Payer: Self-pay

## 2017-09-24 ENCOUNTER — Other Ambulatory Visit (INDEPENDENT_AMBULATORY_CARE_PROVIDER_SITE_OTHER): Payer: Self-pay | Admitting: Vascular Surgery

## 2017-09-25 ENCOUNTER — Other Ambulatory Visit: Payer: Self-pay

## 2017-09-25 ENCOUNTER — Encounter
Admission: RE | Admit: 2017-09-25 | Discharge: 2017-09-25 | Disposition: A | Discharge status: Home / Self Care | Payer: Medicare HMO | Source: Ambulatory Visit | Attending: Vascular Surgery | Admitting: Vascular Surgery

## 2017-09-25 DIAGNOSIS — I493 Ventricular premature depolarization: Secondary | ICD-10-CM | POA: Insufficient documentation

## 2017-09-25 DIAGNOSIS — F17210 Nicotine dependence, cigarettes, uncomplicated: Secondary | ICD-10-CM | POA: Insufficient documentation

## 2017-09-25 DIAGNOSIS — I70219 Atherosclerosis of native arteries of extremities with intermittent claudication, unspecified extremity: Secondary | ICD-10-CM | POA: Diagnosis not present

## 2017-09-25 DIAGNOSIS — Z01818 Encounter for other preprocedural examination: Secondary | ICD-10-CM | POA: Insufficient documentation

## 2017-09-25 DIAGNOSIS — Z79899 Other long term (current) drug therapy: Secondary | ICD-10-CM | POA: Diagnosis not present

## 2017-09-25 DIAGNOSIS — E785 Hyperlipidemia, unspecified: Secondary | ICD-10-CM | POA: Diagnosis not present

## 2017-09-25 DIAGNOSIS — Z7982 Long term (current) use of aspirin: Secondary | ICD-10-CM | POA: Diagnosis not present

## 2017-09-25 DIAGNOSIS — Z01810 Encounter for preprocedural cardiovascular examination: Secondary | ICD-10-CM | POA: Insufficient documentation

## 2017-09-25 DIAGNOSIS — R9431 Abnormal electrocardiogram [ECG] [EKG]: Secondary | ICD-10-CM | POA: Insufficient documentation

## 2017-09-25 DIAGNOSIS — Z950 Presence of cardiac pacemaker: Secondary | ICD-10-CM | POA: Insufficient documentation

## 2017-09-25 HISTORY — DX: Anemia, unspecified: D64.9

## 2017-09-25 HISTORY — DX: Nicotine dependence, cigarettes, uncomplicated: F17.210

## 2017-09-25 LAB — BASIC METABOLIC PANEL
Anion gap: 8 (ref 5–15)
BUN: 13 mg/dL (ref 8–23)
CHLORIDE: 106 mmol/L (ref 98–111)
CO2: 27 mmol/L (ref 22–32)
CREATININE: 0.94 mg/dL (ref 0.61–1.24)
Calcium: 9.4 mg/dL (ref 8.9–10.3)
GFR calc non Af Amer: >60 mL/min (ref >60)
Glucose, Bld: 104 mg/dL — ABNORMAL HIGH (ref 70–99)
POTASSIUM: 4.4 mmol/L (ref 3.5–5.1)
SODIUM: 141 mmol/L (ref 135–145)

## 2017-09-25 LAB — CBC WITH DIFFERENTIAL/PLATELET
Basophils Absolute: 0 10*3/uL (ref 0–0.1)
Basophils Relative: 1 %
EOS ABS: 0.5 10*3/uL (ref 0–0.7)
EOS PCT: 8 %
HCT: 43.7 % (ref 40.0–52.0)
HEMOGLOBIN: 15.1 g/dL (ref 13.0–18.0)
LYMPHS PCT: 25 %
Lymphs Abs: 1.8 10*3/uL (ref 1.0–3.6)
MCH: 32.7 pg (ref 26.0–34.0)
MCHC: 34.5 g/dL (ref 32.0–36.0)
MCV: 94.9 fL (ref 80.0–100.0)
Monocytes Absolute: 0.6 10*3/uL (ref 0.2–1.0)
Monocytes Relative: 9 %
NEUTROS PCT: 57 %
Neutro Abs: 4.1 10*3/uL (ref 1.4–6.5)
PLATELETS: 207 10*3/uL (ref 150–440)
RBC: 4.61 MIL/uL (ref 4.40–5.90)
RDW: 14.3 % (ref 11.5–14.5)
WBC: 7 10*3/uL (ref 3.8–10.6)

## 2017-09-25 LAB — PROTIME-INR
INR: 0.89
Prothrombin Time: 12 seconds (ref 11.4–15.2)

## 2017-09-25 LAB — SURGICAL PCR SCREEN
MRSA, PCR: NEGATIVE
Staphylococcus aureus: NEGATIVE

## 2017-09-25 LAB — APTT: APTT: 29 s (ref 24–36)

## 2017-09-25 LAB — TYPE AND SCREEN
ABO/RH(D): O POS
ANTIBODY SCREEN: NEGATIVE

## 2017-09-25 NOTE — Pre-Procedure Instructions (Signed)
REQUEST FOR CLEARANCE/ EKG CALLED AND FAXED TO DR Vaughan Basta [facility]. ALSO FAXED AND LM AT DR DEW'S

## 2017-09-25 NOTE — Patient Instructions (Signed)
Your procedure is scheduled on: Wednesday, October 03, 2017  Report to [facility]   DO NOT STOP ON FIRST FLOOR TO REGISTER  To find out your arrival time please call [phone] between 1PM - 3PM on Tuesday, October 02, 2017  Remember: Instructions that are not followed completely may result in serious medical risk,  up to and including death, or upon the discretion of your surgeon and anesthesiologist your  surgery may need to be rescheduled.     _X__ 1. Do not eat food after midnight the night before your procedure.                 No gum chewing or hard candies.NOTHING SOLID IN YOUR MOUTH AFTER MIDNIGHT.                  You may drink clear liquids up to 2 hours before you are scheduled to arrive for your surgery-                   DO not drink clear liquids within 2 hours of the start of your surgery.                  Clear Liquids include:  water, apple juice without pulp, clear carbohydrate                 drink such as Clearfast of Gatorade, Black Coffee or Tea (Do not add                 anything to coffee or tea).  __X__2.  On the morning of surgery brush your teeth with toothpaste and water, you                may rinse your mouth with mouthwash if you wish.                   Do not swallow any toothpaste of mouthwash.     _X__ 3.  No Alcohol for 24 hours before or after surgery.   _X__ 4.  Do Not Smoke or use e-cigarettes For 24 Hours Prior to Your Surgery.                 Do not use any chewable tobacco products for at least 6 hours prior to                 surgery.  ____  5.  Bring all medications with you on the day of surgery if instructed.   _X___  6.  Notify your doctor if there is any change in your medical condition      (cold, fever, infections).     Do not wear jewelry, make-up, hairpins, clips or nail polish. Do not wear lotions, powders, or perfumes. You may wear deodorant. Do not shave 48 hours prior to surgery. Men  may shave face and neck. Do not bring valuables to the hospital.    [facility] is not responsible for any belongings or valuables.  Contacts, dentures or bridgework may not be worn into surgery. Leave your suitcase in the car. After surgery it may be brought to your room. For patients admitted to the hospital, discharge time is determined by your treatment team.   Patients discharged the day of surgery will not be allowed to drive home.   Please read over the following fact sheets that you were given:   PREPARING FOR SURGERY   ____ Take  these medicines the morning of surgery with A SIP OF WATER:    1. NONE  2.   3.   4.  5.  6.  ____ Fleet Enema (as directed)   _X___ Use CHG Soap as directed  __X__ Stop ASPIRIN AS OF TODAY  _X___ Stop Anti-inflammatories AS OF TODAY             THIS INCLUDES IBUPROFEN / MOTRIN / ADVIL / ALEVE   ____ Stop supplements until after surgery.    ____ Bring C-Pap to the hospital.   CONTINUE TAKING LIPITOR IN THE EVENING EVERY NIGHT  YOU MAY TAKE TYLENOL IF NEEDED  WEAR LOOSE AND COMFORTABLE CLOTHING

## 2017-10-01 ENCOUNTER — Telehealth (INDEPENDENT_AMBULATORY_CARE_PROVIDER_SITE_OTHER): Payer: Self-pay

## 2017-10-01 NOTE — Telephone Encounter (Signed)
Patient's wife called asking me about the patient needing to see a cardiologist. I called pre-admit and found out the patient had been scheduled with Dr. Clayborn Bigness at [facility] today but [name] office was unable to reach the patient and give him this information. I gave this information to the wife and she is stated she will be calling [facility] to reschedule his appt.

## 2017-10-02 NOTE — Pre-Procedure Instructions (Signed)
[place]

## 2017-10-03 ENCOUNTER — Inpatient Hospital Stay
Admission: RE | Admit: 2017-10-03 | Discharge: 2017-10-05 | DRG: 253 | Disposition: A | Discharge status: Home / Self Care | Payer: Medicare HMO | Attending: Vascular Surgery | Admitting: Vascular Surgery

## 2017-10-03 ENCOUNTER — Other Ambulatory Visit: Payer: Self-pay

## 2017-10-03 ENCOUNTER — Inpatient Hospital Stay: Payer: Medicare HMO

## 2017-10-03 ENCOUNTER — Encounter
Admission: RE | Disposition: A | Discharge status: Home / Self Care | Payer: Self-pay | Source: Home / Self Care | Attending: Vascular Surgery

## 2017-10-03 ENCOUNTER — Encounter: Payer: Self-pay | Admitting: *Deleted

## 2017-10-03 DIAGNOSIS — F17210 Nicotine dependence, cigarettes, uncomplicated: Secondary | ICD-10-CM | POA: Diagnosis present

## 2017-10-03 DIAGNOSIS — Z79899 Other long term (current) drug therapy: Secondary | ICD-10-CM | POA: Diagnosis not present

## 2017-10-03 DIAGNOSIS — E559 Vitamin D deficiency, unspecified: Secondary | ICD-10-CM | POA: Diagnosis present

## 2017-10-03 DIAGNOSIS — H9190 Unspecified hearing loss, unspecified ear: Secondary | ICD-10-CM | POA: Diagnosis present

## 2017-10-03 DIAGNOSIS — I70222 Atherosclerosis of native arteries of extremities with rest pain, left leg: Secondary | ICD-10-CM | POA: Diagnosis not present

## 2017-10-03 DIAGNOSIS — I708 Atherosclerosis of other arteries: Secondary | ICD-10-CM | POA: Diagnosis present

## 2017-10-03 DIAGNOSIS — E785 Hyperlipidemia, unspecified: Secondary | ICD-10-CM | POA: Diagnosis present

## 2017-10-03 DIAGNOSIS — Z7982 Long term (current) use of aspirin: Secondary | ICD-10-CM

## 2017-10-03 DIAGNOSIS — I739 Peripheral vascular disease, unspecified: Principal | ICD-10-CM | POA: Diagnosis present

## 2017-10-03 DIAGNOSIS — Z9181 History of falling: Secondary | ICD-10-CM

## 2017-10-03 DIAGNOSIS — I745 Embolism and thrombosis of iliac artery: Secondary | ICD-10-CM | POA: Diagnosis present

## 2017-10-03 DIAGNOSIS — I70221 Atherosclerosis of native arteries of extremities with rest pain, right leg: Secondary | ICD-10-CM

## 2017-10-03 DIAGNOSIS — I70219 Atherosclerosis of native arteries of extremities with intermittent claudication, unspecified extremity: Secondary | ICD-10-CM | POA: Diagnosis present

## 2017-10-03 HISTORY — PX: ANGIOPLASTY ILLIAC ARTERY: SHX5720

## 2017-10-03 HISTORY — PX: ENDARTERECTOMY FEMORAL: SHX5804

## 2017-10-03 LAB — CBC
HCT: 39.5 % — ABNORMAL LOW (ref 40.0–52.0)
HEMOGLOBIN: 13.3 g/dL (ref 13.0–18.0)
MCH: 32.3 pg (ref 26.0–34.0)
MCHC: 33.8 g/dL (ref 32.0–36.0)
MCV: 95.8 fL (ref 80.0–100.0)
Platelets: 211 10*3/uL (ref 150–440)
RBC: 4.13 MIL/uL — ABNORMAL LOW (ref 4.40–5.90)
RDW: 14.3 % (ref 11.5–14.5)
WBC: 10.5 10*3/uL (ref 3.8–10.6)

## 2017-10-03 LAB — CREATININE, SERUM
CREATININE: 1.07 mg/dL (ref 0.61–1.24)
GFR calc non Af Amer: >60 mL/min (ref >60)

## 2017-10-03 LAB — ABO/RH: ABO/RH(D): O POS

## 2017-10-03 LAB — GLUCOSE, CAPILLARY: Glucose-Capillary: 151 mg/dL — ABNORMAL HIGH (ref 70–99)

## 2017-10-03 SURGERY — ENDARTERECTOMY, FEMORAL
Anesthesia: General | Laterality: Left | Wound class: Clean

## 2017-10-03 MED ORDER — DOPAMINE-DEXTROSE 3.2-5 MG/ML-% IV SOLN: 3.0000 ug/kg/min | INTRAVENOUS | Status: DC

## 2017-10-03 MED ORDER — PROMETHAZINE HCL 25 MG/ML IJ SOLN
12.5000 mg | Freq: Four times a day (QID) | INTRAMUSCULAR | Status: DC | PRN
Start: 2017-10-03 — End: 2017-10-05
  Administered 2017-10-03: 12.5 mg via INTRAVENOUS
  Filled 2017-10-03: qty 1

## 2017-10-03 MED ORDER — HEPARIN SODIUM (PORCINE) 1000 UNIT/ML IJ SOLN
INTRAMUSCULAR | Status: AC
Start: 2017-10-03 — End: ?
  Filled 2017-10-03: qty 1

## 2017-10-03 MED ORDER — FAMOTIDINE IN NACL 20-0.9 MG/50ML-% IV SOLN
20.0000 mg | Freq: Two times a day (BID) | INTRAVENOUS | Status: DC
  Administered 2017-10-03: 20 mg via INTRAVENOUS
  Filled 2017-10-03: qty 50

## 2017-10-03 MED ORDER — HYDROMORPHONE HCL 1 MG/ML IJ SOLN
INTRAMUSCULAR | Status: AC
  Administered 2017-10-03: 0.25 mg via INTRAVENOUS
  Filled 2017-10-03: qty 1

## 2017-10-03 MED ORDER — FENTANYL CITRATE (PF) 100 MCG/2ML IJ SOLN
INTRAMUSCULAR | Status: AC
  Administered 2017-10-03: 25 ug via INTRAVENOUS
  Filled 2017-10-03: qty 2

## 2017-10-03 MED ORDER — LIDOCAINE-EPINEPHRINE (PF) 1 %-1:200000 IJ SOLN
INTRAMUSCULAR | Status: AC
  Filled 2017-10-03: qty 30

## 2017-10-03 MED ORDER — HEPARIN SODIUM (PORCINE) 5000 UNIT/ML IJ SOLN
INTRAMUSCULAR | Status: AC
  Filled 2017-10-03: qty 1

## 2017-10-03 MED ORDER — GUAIFENESIN-DM 100-10 MG/5ML PO SYRP
15.0000 mL | ORAL_SOLUTION | ORAL | Status: DC | PRN
  Filled 2017-10-03: qty 15

## 2017-10-03 MED ORDER — HEPARIN (PORCINE) IN NACL 1000-0.9 UT/500ML-% IV SOLN
INTRAVENOUS | Status: AC
  Filled 2017-10-03: qty 1000

## 2017-10-03 MED ORDER — EVICEL 2 ML EX KIT
PACK | CUTANEOUS | Status: DC | PRN
  Administered 2017-10-03: 2 mL

## 2017-10-03 MED ORDER — FENTANYL CITRATE (PF) 100 MCG/2ML IJ SOLN
INTRAMUSCULAR | Status: AC
  Filled 2017-10-03: qty 2

## 2017-10-03 MED ORDER — ACETAMINOPHEN 650 MG RE SUPP: 325.0000 mg | RECTAL | Status: DC | PRN

## 2017-10-03 MED ORDER — FENTANYL CITRATE (PF) 100 MCG/2ML IJ SOLN
25.0000 ug | INTRAMUSCULAR | Status: DC | PRN
  Administered 2017-10-03 (×2): 25 ug via INTRAVENOUS

## 2017-10-03 MED ORDER — SODIUM CHLORIDE 0.9 % IV SOLN
INTRAVENOUS | Status: DC | PRN
  Administered 2017-10-03: 200 mL via INTRAMUSCULAR

## 2017-10-03 MED ORDER — FENTANYL CITRATE (PF) 100 MCG/2ML IJ SOLN
25.0000 ug | INTRAMUSCULAR | Status: DC | PRN
  Administered 2017-10-03 (×4): 25 ug via INTRAVENOUS

## 2017-10-03 MED ORDER — FAMOTIDINE 20 MG PO TABS
20.0000 mg | ORAL_TABLET | Freq: Once | ORAL | Status: AC
  Administered 2017-10-03: 20 mg via ORAL

## 2017-10-03 MED ORDER — ALUM & MAG HYDROXIDE-SIMETH 200-200-20 MG/5ML PO SUSP
15.0000 mL | ORAL | Status: DC | PRN
  Filled 2017-10-03: qty 30

## 2017-10-03 MED ORDER — SUGAMMADEX SODIUM 200 MG/2ML IV SOLN
INTRAVENOUS | Status: DC | PRN
  Administered 2017-10-03: 118.2 mg via INTRAVENOUS

## 2017-10-03 MED ORDER — ACETAMINOPHEN 325 MG PO TABS: 650.0000 mg | ORAL_TABLET | Freq: Four times a day (QID) | ORAL | Status: DC | PRN

## 2017-10-03 MED ORDER — PHENOL 1.4 % MT LIQD
1.0000 | OROMUCOSAL | Status: DC | PRN
  Filled 2017-10-03: qty 177

## 2017-10-03 MED ORDER — POTASSIUM CHLORIDE CRYS ER 20 MEQ PO TBCR: 20.0000 meq | EXTENDED_RELEASE_TABLET | Freq: Every day | ORAL | Status: DC | PRN

## 2017-10-03 MED ORDER — LORAZEPAM 2 MG/ML IJ SOLN
1.0000 mg | INTRAMUSCULAR | Status: DC | PRN
  Administered 2017-10-04: 2 mg via INTRAVENOUS
  Filled 2017-10-03: qty 1

## 2017-10-03 MED ORDER — SEVOFLURANE IN SOLN
RESPIRATORY_TRACT | Status: AC
  Filled 2017-10-03: qty 250

## 2017-10-03 MED ORDER — SUGAMMADEX SODIUM 200 MG/2ML IV SOLN
INTRAVENOUS | Status: AC
  Filled 2017-10-03: qty 4

## 2017-10-03 MED ORDER — CHLORHEXIDINE GLUCONATE CLOTH 2 % EX PADS: 6.0000 | MEDICATED_PAD | Freq: Once | CUTANEOUS | Status: DC

## 2017-10-03 MED ORDER — MORPHINE SULFATE (PF) 2 MG/ML IV SOLN
2.0000 mg | INTRAVENOUS | Status: DC | PRN
  Administered 2017-10-03: 4 mg via INTRAVENOUS
  Administered 2017-10-04: 2 mg via INTRAVENOUS
  Filled 2017-10-03: qty 2
  Filled 2017-10-03: qty 1

## 2017-10-03 MED ORDER — FAMOTIDINE 20 MG PO TABS
ORAL_TABLET | ORAL | Status: AC
  Filled 2017-10-03: qty 1

## 2017-10-03 MED ORDER — NITROGLYCERIN IN D5W 200-5 MCG/ML-% IV SOLN: 5.0000 ug/min | INTRAVENOUS | Status: DC

## 2017-10-03 MED ORDER — ADULT MULTIVITAMIN W/MINERALS CH
1.0000 | ORAL_TABLET | Freq: Every day | ORAL | Status: DC
  Administered 2017-10-04 – 2017-10-05 (×2): 1 via ORAL
  Filled 2017-10-03 (×2): qty 1

## 2017-10-03 MED ORDER — OXYCODONE-ACETAMINOPHEN 5-325 MG PO TABS
1.0000 | ORAL_TABLET | ORAL | Status: DC | PRN
  Administered 2017-10-04 – 2017-10-05 (×2): 1 via ORAL
  Filled 2017-10-03: qty 1
  Filled 2017-10-03: qty 2
  Filled 2017-10-03: qty 1

## 2017-10-03 MED ORDER — SODIUM CHLORIDE 0.9 % IV SOLN: 500.0000 mL | Freq: Once | INTRAVENOUS | Status: DC | PRN

## 2017-10-03 MED ORDER — FENTANYL CITRATE (PF) 100 MCG/2ML IJ SOLN
INTRAMUSCULAR | Status: DC | PRN
  Administered 2017-10-03 (×4): 50 ug via INTRAVENOUS

## 2017-10-03 MED ORDER — HEPARIN SODIUM (PORCINE) 1000 UNIT/ML IJ SOLN
INTRAMUSCULAR | Status: DC | PRN
  Administered 2017-10-03: 2000 [IU] via INTRAVENOUS
  Administered 2017-10-03: 5000 [IU] via INTRAVENOUS

## 2017-10-03 MED ORDER — HYDROMORPHONE HCL 1 MG/ML IJ SOLN
INTRAMUSCULAR | Status: AC
  Administered 2017-10-03: 0.5 mg via INTRAVENOUS
  Filled 2017-10-03: qty 0.5

## 2017-10-03 MED ORDER — ACETAMINOPHEN 325 MG PO TABS: 325.0000 mg | ORAL_TABLET | ORAL | Status: DC | PRN

## 2017-10-03 MED ORDER — HYDRALAZINE HCL 20 MG/ML IJ SOLN: 5.0000 mg | INTRAMUSCULAR | Status: DC | PRN

## 2017-10-03 MED ORDER — ROCURONIUM BROMIDE 100 MG/10ML IV SOLN
INTRAVENOUS | Status: DC | PRN
  Administered 2017-10-03: 20 mg via INTRAVENOUS
  Administered 2017-10-03: 40 mg via INTRAVENOUS
  Administered 2017-10-03: 20 mg via INTRAVENOUS

## 2017-10-03 MED ORDER — PROPOFOL 10 MG/ML IV BOLUS
INTRAVENOUS | Status: AC
  Filled 2017-10-03: qty 20

## 2017-10-03 MED ORDER — CEFAZOLIN SODIUM-DEXTROSE 2-4 GM/100ML-% IV SOLN
2.0000 g | INTRAVENOUS | Status: AC
  Administered 2017-10-03: 2 g via INTRAVENOUS

## 2017-10-03 MED ORDER — EVICEL 2 ML EX KIT
PACK | CUTANEOUS | Status: AC
  Filled 2017-10-03: qty 1

## 2017-10-03 MED ORDER — ONDANSETRON HCL 4 MG/2ML IJ SOLN
INTRAMUSCULAR | Status: DC | PRN
  Administered 2017-10-03: 4 mg via INTRAVENOUS

## 2017-10-03 MED ORDER — LABETALOL HCL 5 MG/ML IV SOLN: 10.0000 mg | INTRAVENOUS | Status: DC | PRN

## 2017-10-03 MED ORDER — PROPOFOL 10 MG/ML IV BOLUS
INTRAVENOUS | Status: DC | PRN
  Administered 2017-10-03: 120 mg via INTRAVENOUS

## 2017-10-03 MED ORDER — LACTATED RINGERS IV SOLN
INTRAVENOUS | Status: DC
  Administered 2017-10-03 (×2): via INTRAVENOUS

## 2017-10-03 MED ORDER — ONDANSETRON HCL 4 MG/2ML IJ SOLN: 4.0000 mg | Freq: Once | INTRAMUSCULAR | Status: DC | PRN

## 2017-10-03 MED ORDER — DEXMEDETOMIDINE HCL IN NACL 400 MCG/100ML IV SOLN: 0.2000 ug/kg/h | INTRAVENOUS | Status: DC

## 2017-10-03 MED ORDER — ASPIRIN EC 81 MG PO TBEC
81.0000 mg | DELAYED_RELEASE_TABLET | Freq: Every day | ORAL | Status: DC
  Administered 2017-10-04 – 2017-10-05 (×2): 81 mg via ORAL
  Filled 2017-10-03 (×2): qty 1

## 2017-10-03 MED ORDER — CEFAZOLIN SODIUM-DEXTROSE 2-4 GM/100ML-% IV SOLN
INTRAVENOUS | Status: AC
  Filled 2017-10-03: qty 100

## 2017-10-03 MED ORDER — ONDANSETRON HCL 4 MG/2ML IJ SOLN
4.0000 mg | Freq: Four times a day (QID) | INTRAMUSCULAR | Status: DC | PRN
  Administered 2017-10-03: 4 mg via INTRAVENOUS
  Filled 2017-10-03: qty 2

## 2017-10-03 MED ORDER — SORBITOL 70 % SOLN
30.0000 mL | Freq: Every day | Status: DC | PRN
  Filled 2017-10-03: qty 30

## 2017-10-03 MED ORDER — METOPROLOL TARTRATE 5 MG/5ML IV SOLN: 2.0000 mg | INTRAVENOUS | Status: DC | PRN

## 2017-10-03 MED ORDER — MAGNESIUM SULFATE 2 GM/50ML IV SOLN: 2.0000 g | Freq: Every day | INTRAVENOUS | Status: DC | PRN

## 2017-10-03 MED ORDER — SODIUM CHLORIDE FLUSH 0.9 % IV SOLN
INTRAVENOUS | Status: AC
  Administered 2017-10-03: 10 mL
  Filled 2017-10-03: qty 10

## 2017-10-03 MED ORDER — DEXAMETHASONE SODIUM PHOSPHATE 10 MG/ML IJ SOLN
INTRAMUSCULAR | Status: DC | PRN
  Administered 2017-10-03: 5 mg via INTRAVENOUS

## 2017-10-03 MED ORDER — LIDOCAINE HCL (CARDIAC) PF 100 MG/5ML IV SOSY
PREFILLED_SYRINGE | INTRAVENOUS | Status: DC | PRN
  Administered 2017-10-03: 60 mg via INTRAVENOUS

## 2017-10-03 MED ORDER — ATORVASTATIN CALCIUM 10 MG PO TABS
10.0000 mg | ORAL_TABLET | Freq: Every evening | ORAL | Status: DC
  Administered 2017-10-04: 10 mg via ORAL
  Filled 2017-10-03 (×2): qty 1

## 2017-10-03 MED ORDER — LIDOCAINE HCL (PF) 1 % IJ SOLN
INTRAMUSCULAR | Status: AC
  Filled 2017-10-03: qty 2

## 2017-10-03 MED ORDER — ENOXAPARIN SODIUM 40 MG/0.4ML ~~LOC~~ SOLN
40.0000 mg | SUBCUTANEOUS | Status: DC
  Administered 2017-10-04 – 2017-10-05 (×2): 40 mg via SUBCUTANEOUS
  Filled 2017-10-03 (×2): qty 0.4

## 2017-10-03 MED ORDER — SODIUM CHLORIDE 0.9 % IV SOLN
INTRAVENOUS | Status: DC
  Administered 2017-10-03 – 2017-10-04 (×2): via INTRAVENOUS

## 2017-10-03 MED ORDER — DOCUSATE SODIUM 100 MG PO CAPS
100.0000 mg | ORAL_CAPSULE | Freq: Every day | ORAL | Status: DC
  Administered 2017-10-04 – 2017-10-05 (×2): 100 mg via ORAL
  Filled 2017-10-03 (×2): qty 1

## 2017-10-03 MED ORDER — SENNOSIDES-DOCUSATE SODIUM 8.6-50 MG PO TABS: 1.0000 | ORAL_TABLET | Freq: Every evening | ORAL | Status: DC | PRN

## 2017-10-03 MED ORDER — HYDROMORPHONE HCL 1 MG/ML IJ SOLN
0.2500 mg | INTRAMUSCULAR | Status: DC | PRN
  Administered 2017-10-03 (×3): 0.25 mg via INTRAVENOUS
  Administered 2017-10-03: 0.5 mg via INTRAVENOUS
  Administered 2017-10-03 (×3): 0.25 mg via INTRAVENOUS

## 2017-10-03 MED ORDER — CEFAZOLIN SODIUM-DEXTROSE 2-4 GM/100ML-% IV SOLN
2.0000 g | Freq: Three times a day (TID) | INTRAVENOUS | Status: AC
  Administered 2017-10-03 – 2017-10-04 (×2): 2 g via INTRAVENOUS
  Filled 2017-10-03 (×2): qty 100

## 2017-10-03 MED ORDER — ROCURONIUM BROMIDE 50 MG/5ML IV SOLN
INTRAVENOUS | Status: AC
  Filled 2017-10-03: qty 1

## 2017-10-03 SURGICAL SUPPLY — 77 items
ADH SKN CLS APL DERMABOND .7 (GAUZE/BANDAGES/DRESSINGS) ×1
APPLIER CLIP 11 MED OPEN (CLIP)
APPLIER CLIP 9.375 SM OPEN (CLIP)
APR CLP MED 11 20 MLT OPN (CLIP)
APR CLP SM 9.3 20 MLT OPN (CLIP)
BAG COUNTER SPONGE EZ (MISCELLANEOUS) ×2 IMPLANT
BAG DECANTER FOR FLEXI CONT (MISCELLANEOUS) ×2 IMPLANT
BAG ISL LRG 20X20 DRWSTRG (DRAPES)
BAG ISOLATATION DRAPE 20X20 ST (DRAPES) IMPLANT
BAG SPNG 4X4 CLR HAZ (MISCELLANEOUS) ×1
BLADE SURG 15 STRL LF DISP TIS (BLADE) ×1 IMPLANT
BLADE SURG 15 STRL SS (BLADE) ×2
BLADE SURG SZ11 CARB STEEL (BLADE) ×2 IMPLANT
BOOT SUTURE AID YELLOW STND (SUTURE) ×2 IMPLANT
BRUSH SCRUB EZ  4% CHG (MISCELLANEOUS) ×1
BRUSH SCRUB EZ 4% CHG (MISCELLANEOUS) ×1 IMPLANT
CANISTER SUCT 1200ML W/VALVE (MISCELLANEOUS) ×2 IMPLANT
CLIP APPLIE 11 MED OPEN (CLIP) IMPLANT
CLIP APPLIE 9.375 SM OPEN (CLIP) IMPLANT
COVER PROBE FLX POLY STRL (MISCELLANEOUS) ×2 IMPLANT
DERMABOND ADVANCED (GAUZE/BANDAGES/DRESSINGS) ×1
DERMABOND ADVANCED .7 DNX12 (GAUZE/BANDAGES/DRESSINGS) ×1 IMPLANT
DRAPE INCISE IOBAN 66X45 STRL (DRAPES) ×2 IMPLANT
DRAPE ISOLATE BAG 20X20 STRL (DRAPES)
DRAPE LAPAROTOMY 100X77 ABD (DRAPES) ×2 IMPLANT
DURAPREP 26ML APPLICATOR (WOUND CARE) ×2 IMPLANT
ELECT CAUTERY BLADE 6.4 (BLADE) ×4 IMPLANT
ELECT CAUTERY BLADE TIP 2.5 (TIP) ×2
ELECT REM PT RETURN 9FT ADLT (ELECTROSURGICAL) ×2
ELECTRODE CAUTERY BLDE TIP 2.5 (TIP) IMPLANT
ELECTRODE REM PT RTRN 9FT ADLT (ELECTROSURGICAL) ×1 IMPLANT
GLOVE BIO SURGEON STRL SZ7 (GLOVE) ×4 IMPLANT
GLOVE INDICATOR 7.5 STRL GRN (GLOVE) ×2 IMPLANT
GOWN STRL REUS W/ TWL LRG LVL3 (GOWN DISPOSABLE) ×2 IMPLANT
GOWN STRL REUS W/ TWL XL LVL3 (GOWN DISPOSABLE) ×2 IMPLANT
GOWN STRL REUS W/TWL LRG LVL3 (GOWN DISPOSABLE) ×4
GOWN STRL REUS W/TWL XL LVL3 (GOWN DISPOSABLE) ×4
HEMOSTAT SURGICEL 2X3 (HEMOSTASIS) ×2 IMPLANT
IV NS 500ML (IV SOLUTION) ×2
IV NS 500ML BAXH (IV SOLUTION) ×1 IMPLANT
KIT TURNOVER KIT A (KITS) ×2 IMPLANT
LABEL OR SOLS (LABEL) ×2 IMPLANT
LOOP RED MAXI  1X406MM (MISCELLANEOUS) ×3
LOOP VESSEL MAXI 1X406 RED (MISCELLANEOUS) ×3 IMPLANT
LOOP VESSEL MINI 0.8X406 BLUE (MISCELLANEOUS) ×2 IMPLANT
LOOPS BLUE MINI 0.8X406MM (MISCELLANEOUS) ×2
NS IRRIG 500ML POUR BTL (IV SOLUTION) ×2 IMPLANT
PACK BASIN MAJOR ARMC (MISCELLANEOUS) ×2 IMPLANT
PACK UNIVERSAL (MISCELLANEOUS) ×2 IMPLANT
PATCH CAROTID ECM VASC 1X10 (Prosthesis & Implant Heart) ×1 IMPLANT
PENCIL ELECTRO HAND CTR (MISCELLANEOUS) ×2 IMPLANT
SPONGE LAP 18X18 RF (DISPOSABLE) ×2 IMPLANT
SPONGE XRAY 4X4 16PLY STRL (MISCELLANEOUS) ×2 IMPLANT
SUT GTX CV-5 TTC13 DBL (SUTURE) ×4 IMPLANT
SUT MNCRL 4-0 (SUTURE) ×2
SUT MNCRL 4-0 27XMFL (SUTURE) ×1
SUT PROLENE 5 0 RB 1 DA (SUTURE) ×4 IMPLANT
SUT PROLENE 6 0 BV (SUTURE) ×8 IMPLANT
SUT PROLENE 7 0 BV 1 (SUTURE) ×4 IMPLANT
SUT SILK 0 SH 30 (SUTURE) ×2 IMPLANT
SUT SILK 2 0 (SUTURE) ×2
SUT SILK 2-0 18XBRD TIE 12 (SUTURE) ×1 IMPLANT
SUT SILK 3 0 (SUTURE) ×2
SUT SILK 3-0 18XBRD TIE 12 (SUTURE) ×1 IMPLANT
SUT SILK 4 0 (SUTURE) ×2
SUT SILK 4-0 18XBRD TIE 12 (SUTURE) ×1 IMPLANT
SUT VIC AB 2-0 CT1 (SUTURE) ×4 IMPLANT
SUT VIC AB 2-0 CT1 27 (SUTURE) ×4
SUT VIC AB 2-0 CT1 TAPERPNT 27 (SUTURE) ×2 IMPLANT
SUT VIC AB 3-0 SH 27 (SUTURE) ×4
SUT VIC AB 3-0 SH 27X BRD (SUTURE) ×2 IMPLANT
SUT VICRYL+ 3-0 36IN CT-1 (SUTURE) ×4 IMPLANT
SUTURE MNCRL 4-0 27XMF (SUTURE) ×1 IMPLANT
SYR 20CC LL (SYRINGE) ×2 IMPLANT
SYR 5ML LL (SYRINGE) ×2 IMPLANT
SYR BULB IRRIG 60ML STRL (SYRINGE) ×2 IMPLANT
TRAY FOLEY MTR SLVR 16FR STAT (SET/KITS/TRAYS/PACK) ×2 IMPLANT

## 2017-10-03 NOTE — Anesthesia Post-op Follow-up Note (Signed)
Anesthesia QCDR form completed.        

## 2017-10-03 NOTE — Anesthesia Procedure Notes (Signed)
Procedure Name: Intubation Date/Time: 10/03/2017 12:54 PM Performed by: Wess Botts, RN Pre-anesthesia Checklist: Patient identified, Emergency Drugs available, Suction available, Patient being monitored and Timeout performed Patient Re-evaluated:Patient Re-evaluated prior to induction Oxygen Delivery Method: Circle system utilized Preoxygenation: Pre-oxygenation with 100% oxygen Induction Type: IV induction Ventilation: Oral airway inserted - appropriate to patient size and Mask ventilation without difficulty Laryngoscope Size: Mac and 4 Grade View: Grade I Tube type: Oral Tube size: 7.5 mm Number of attempts: 1 Airway Equipment and Method: Stylet Placement Confirmation: ETT inserted through vocal cords under direct vision,  positive ETCO2,  CO2 detector and breath sounds checked- equal and bilateral Secured at: 21 cm Tube secured with: Tape Dental Injury: Teeth and Oropharynx as per pre-operative assessment

## 2017-10-03 NOTE — H&P (Signed)
[facility] History & Physical Update  The patient was interviewed and re-examined.  The patient's previous History and Physical has been reviewed and is unchanged.  There is no change in the plan of care. We plan to proceed with the scheduled procedure.  [name], MD  10/03/2017, 12:26 PM

## 2017-10-03 NOTE — Progress Notes (Signed)
Phenergan 12.5mg  ICP administered per protocol.  Pt resting comfortably at this time.

## 2017-10-03 NOTE — Anesthesia Preprocedure Evaluation (Signed)
Anesthesia Evaluation  Patient identified by MRN, date of birth, ID band Patient awake    Reviewed: Allergy & Precautions, NPO status , Patient's Chart, lab work & pertinent test results  History of Anesthesia Complications Negative for: history of anesthetic complications  Airway Mallampati: II       Dental  (+) Upper Dentures, Lower Dentures   Pulmonary neg sleep apnea, neg COPD, Current Smoker,           Cardiovascular (-) hypertension+ Peripheral Vascular Disease  (-) Past MI and (-) CHF (-) dysrhythmias (-) Valvular Problems/Murmurs     Neuro/Psych neg Seizures    GI/Hepatic Neg liver ROS, neg GERD  ,  Endo/Other  neg diabetes  Renal/GU negative Renal ROS     Musculoskeletal   Abdominal   Peds  Hematology  (+) anemia ,   Anesthesia Other Findings   Reproductive/Obstetrics                             Anesthesia Physical Anesthesia Plan  ASA: III  Anesthesia Plan: General   Post-op Pain Management:    Induction: Intravenous  PONV Risk Score and Plan: 1 and Ondansetron  Airway Management Planned: Oral ETT  Additional Equipment:   Intra-op Plan:   Post-operative Plan:   Informed Consent: I have reviewed the patients History and Physical, chart, labs and discussed the procedure including the risks, benefits and alternatives for the proposed anesthesia with the patient or authorized representative who has indicated his/her understanding and acceptance.     Plan Discussed with:   Anesthesia Plan Comments:         Anesthesia Quick Evaluation

## 2017-10-03 NOTE — Transfer of Care (Signed)
Immediate Anesthesia Transfer of Care Note  Patient: Jerry [name]  Procedure(s) Performed: ENDARTERECTOMY FEMORAL (Left ) ANGIOPLASTY ILIAC ARTERY (Left )  Patient Location: PACU  Anesthesia Type:General  Level of Consciousness: awake and sedated  Airway & Oxygen Therapy: Patient Spontanous Breathing and Patient connected to face mask oxygen  Post-op Assessment: Report given to RN and Post -op Vital signs reviewed and stable  Post vital signs: Reviewed and stable  Last Vitals:  Vitals Value Taken Time  BP 165/91 10/03/2017  4:44 PM  Temp    Pulse 74 10/03/2017  4:45 PM  Resp 17 10/03/2017  4:45 PM  SpO2 100 % 10/03/2017  4:45 PM  Vitals shown include unvalidated device data.  Last Pain:  Vitals:   10/03/17 0915  TempSrc: Tympanic  PainSc: 4          Complications: No apparent anesthesia complications

## 2017-10-03 NOTE — Progress Notes (Signed)
Pt refused Lipitor - educated patient on importance of medication and not reocclusion of newly placed stents.  Pt continue to refuse

## 2017-10-03 NOTE — Op Note (Signed)
OPERATIVE NOTE   PROCEDURE: 1.Left common femoral, profunda femoris, and superficial femoral artery endarterectomies and patch angioplasty 2.  Ultrasound guidance for vascular access right femoral artery 3.  Catheter placement into right external iliac artery from left femoral approach and catheter placement into left common femoral artery from right femoral approach 4.  Aortogram and left iliofemoral angiogram. 5.  Kissing balloon stent placement to the distal aorta and proximal common iliac arteries with 7 mm diameter by 28 mm length lifestream stent on the right and 7 mm diameter by 58 mm length lifestream stent on the left 6.  Stent placement to the right common iliac artery with 7 mm diameter by 28 mm length lifestream stent just below the previous stent remaining in the common iliac artery 7.  Lifestream stent placement in the left common iliac artery and proximal external iliac artery with 7 mm diameter by 37 mm length lifestream stent 8.  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 Previous femoral to femoral bypass now occluded  POST-OPERATIVE DIAGNOSIS: Same  SURGEON: [name], MD  ANESTHESIA: general  ESTIMATED BLOOD LOSS: 150 cc  FINDING(S): 1. significant plaque in left common femoral, profunda femoris, and superficial femoral arteries. 2.  Left common and external iliac artery stenosis  SPECIMEN(S): Left common femoral, profunda femoris, and superficial femoral artery plaque as well as portion of the old graft.  INDICATIONS:  Patient presents with severe PAD and worsening claudication and symptoms of early rest pain.  He has had previous femoral to femoral bypass which is occluded and he has bifurcation disease of the left femoral as well as the known left iliac occlusion.   Left femoral endarterectomy is planned to try to improve perfusion along with iliac stent placement or redo femoral to femoral bypass. The risks and benefits as well as alternative therapies including intervention were reviewed in detail all questions were answered the patient agrees to proceed with surgery.  DESCRIPTION: After obtaining full informed written consent, the patient was brought back to the operating room and placed supine upon the operating table. The patient received IV antibiotics prior to induction. After obtaining adequate anesthesia, the patient was prepped and draped in the standard fashion appropriate time out is called.   Vertical incision was created overlying the left femoral arteries. The common femoral artery proximally, and superficial femoral artery, and primary profunda femoris artery branches were encircled with vessel loops and prepared for control. The left femoral arteries were found to have significant plaque from the common femoral artery into the profunda and superficial femoral arteries proximally.  I also encircled the previous bypass graft a few centimeters before the left femoral anastomosis.   5000 units of heparin was given and allowed circulate for 5 minutes.  Later, an additional 2000 units of heparin were given.  Attention is then turned to the left femoral artery. An arteriotomy is made with 11 blade and extended with Potts scissors in the common femoral artery and carried down onto the first 1-2 cm of the superficial femoral artery.  I detached the graft from the anastomosis to the femoral artery removing all prosthetic that was visible from the artery.  This was transected and a small portion of the distal end of the previous femoral to femoral bypass graft was sent as part of the specimen.  An  endarterectomy was then performed. The [facility] was used to create a plane. The proximal endpoint was cut flush with tenotomy scissors. This was in the  proximal common femoral artery. An eversion endarterectomy was then performed for the first 2-3 cm of the profunda femoris artery. Good backbleeding was then seen. The distal endpoint of the superficial femoral artery endarterectomy was created with gentle traction and the distal endpoint was tacked down with two 7-0 Prolene sutures.  At this point, an 8 [place] sheath was placed intraluminally in the left femoral artery directed towards the aorta.  I used a Kumpe catheter and advantage wire and tried to cross the left iliac occlusion from the left femoral approach initially.  Despite multiple efforts, this was not easily successful and the disease went all the way to the femoral origin so it was clear that kissing balloon stents would be required anyway.  I then used ultrasound to access the right femoral artery.  This was done under direct ultrasound guidance with a Seldinger needle.  A J-wire and 5 French sheath was placed which was later upsized to a 6 [place] sheath.  Ultrasound was used to stick just above the previous femoral to femoral bypass.  A pigtail catheter was placed into the aorta and imaging was performed showing the occlusion of the left common iliac artery.  I then used a rim catheter and cross the aortic bifurcation from the right femoral approach.  I was able to advance down all the way into the left femoral artery and pulled the wire out through the arteriotomy in the left femoral artery with the help of the rim catheter.  Using this approach, a Kumpe catheter was then placed through the left femoral sheath over the wire into the right external iliac artery.  The wire was then removed and intraluminal flow was confirmed in the right external iliac artery from the left femoral approach.  I then used a rim catheter and flipped this up into the aorta confirming intraluminal flow in the aorta from the left femoral approach.  The advantage wire was replaced from the right femoral approach and we  proceeded with kissing balloon stents.  Using a 6 French sheath, the longest stent to be used on the right was a 7 mm diameter by 28 mm length stent.  On the left a 7 mm diameter by 58 mm length stent was used.  These were deployed about 2 cm into the aorta to ensure that inflow was obtained.  The initial stent was not far into the right common iliac artery so a second 7 mm diameter by 28 mm length stent was deployed and inflated to 12 atm to ensure that the right iliac system was not damaged.  There remained occlusive disease below this stent on the left.  A 7 mm diameter by 37 mm length lifestream stent was then deployed in the left common iliac artery down to the proximal external iliac artery.  Still, there is occlusion beyond this and we exchanged for a 0.018 wire and deployed an 8 mm diameter by 10 cm length Viabahn stent down to the top of the femoral head overlapping the previously placed lifestream stent by about a centimeter and a half.  There was now inflow with brisk pulsatile bleeding on the left.  8 mm Lutonix balloons were used to post dilate the proximal edge of the stents in the aorta and proximal common iliac arteries.  7 mm balloon was used to post dilate the  Viabahn stent in the left external iliac artery.  Completion aortogram showed brisk flow through the stents although the left side was somewhat slow because of having to control the left femoral artery.  A retrograde angiogram through the left femoral sheath showed the stents to be widely patent and none of the placed stents appeared to have more than 10% residual stenosis.  The right femoral sheath was removed and a StarClose closure device was then placed on the right.  Excellent hemostatic result was seen.  The left femoral sheath was removed and brisk pulsatile inflow was identified.  I then clamped just below the stent within a centimeter of the proximal portion of the endarterectomy site. The Cormatrix patcth is then selected and  prepared for a patch angioplasty to the left femoral artery.  It is cut and beveled and started at the proximal endpoint with a 6-0 Prolene suture.  Approximately one half of the suture line is run medially and laterally and the distal end point was cut and bevelled to match the arteriotomy.  A second 6-0 Prolene was started at the distal end point which was about 2 cm into the left superficial femoral artery and run to the mid portion to complete the arteriotomy.  The vessel was flushed prior to release of control and completion of the anastomosis.  At this point, flow was established first to the profunda femoris artery and then to the superficial femoral artery. Easily palpable pulses are noted well beyond the anastomosis and both arteries.  Surgicel and Evicel topical hemostatic agents were placed in the femoral incision and hemostasis was complete. The femoral incision was then closed in a layered fashion with 2 layers of 2-0 Vicryl, 2 layers of 3-0 Vicryl, and 4-0 Monocryl for the skin closure. Dermabond and sterile dressing were then placed over the incision.  The patient was then awakened from anesthesia and taken to the recovery room in stable condition having tolerated the procedure well.  COMPLICATIONS: None  CONDITION: Stable     [name] 10/03/2017 4:27 PM   This note was created with Dragon Medical transcription system. Any errors in dictation are purely unintentional.

## 2017-10-03 NOTE — Consult Note (Signed)
Name: Jerry [name] MRN: [id number] DOB: 1946/01/24    ADMISSION DATE:  10/03/2017 CONSULTATION DATE: 10/03/2017  REFERRING MD : Dr. Delana Meyer   CHIEF COMPLAINT: PAD with claudication in left lower extremity   BRIEF PATIENT DESCRIPTION:  72 yo male admitted with atherosclerotic occlusive disease and claudication of LLE s/p Femoral artery endarterectomy, stent placement to distal aorta and multiple stent placement to right and left iliac arteries  SIGNIFICANT EVENTS/STUDIES:  07/10 Pt admitted to ICU   HISTORY OF PRESENT ILLNESS:   This is a 72 yo male with a PMH as listed below who presented to [facility] on 07/10 for left lower extremity femoral artery endarterectomy, stent placement to distal aorta and multiple stent placements to right and left iliac arteries due to atherosclerotic occlusive disease and claudication.  He was subsequently admitted to ICU post procedure for further workup and treatment.  PCCM consulted for management of agitation/delirium.   PAST MEDICAL HISTORY :   has a past medical history of Anemia, Cigarette smoker, Hearing loss, Hyperlipidemia, Peripheral vascular disease ([place]), and Vitamin D deficiency.  has a past surgical history that includes Left Leg stent (Left, 2009); Colonoscopy (01/2010); Lower Extremity Angiography (Left, 09/19/2017); Back surgery (1990); Vascular surgery (Left, 2009); and Mitral valve replacement (Right, 08/2017). Prior to Admission medications   Medication Sig Start Date End Date Taking? Authorizing Provider  atorvastatin (LIPITOR) 10 MG tablet Take 1 tablet (10 mg total) by mouth daily. 09/19/17 09/19/18 Yes Dew, Erskine Squibb, MD  acetaminophen (TYLENOL) 325 MG tablet Take 650 mg by mouth every 6 (six) hours as needed for moderate pain.    [provider]  aspirin EC 81 MG tablet Take 81 mg by mouth daily.    [provider]  Multiple Vitamin (MULTIVITAMIN) capsule Take 1 capsule by mouth as needed.     [provider]    No Known Allergies  FAMILY HISTORY:  family history is not on file. SOCIAL HISTORY:  reports that he has been smoking cigarettes.  He has a 40.00 pack-year smoking history. He has never used smokeless tobacco. He reports that he does not drink alcohol or use drugs.  REVIEW OF SYSTEMS:   Unable to assess pt lethargic   SUBJECTIVE:  Unable to assess pt lethargic   VITAL SIGNS: Temp:  [97.6 F (36.4 C)-98 F (36.7 C)] 97.9 F (36.6 C) (07/10 2000) Pulse Rate:  [57-89] 64 (07/10 2300) Resp:  [0-30] 7 (07/10 2300) BP: (121-171)/(58-119) 142/71 (07/10 2300) SpO2:  [93 %-100 %] 93 % (07/10 2300) Weight:  [59.1 kg (130 lb 4.8 oz)-60.1 kg (132 lb 7.9 oz)] 60.1 kg (132 lb 7.9 oz) (07/10 2000)  PHYSICAL EXAMINATION: General: well developed, well nourished male resting in bed, NAD  Neuro: lethargic, PERRL  HEENT: supple, no JVD Cardiovascular: nsr, rrr, no R/G, 1+ palpable bilateral distal pulses  Lungs: rhonchi throughout, even, non labored  Abdomen: +BS x4, soft, non distended  Musculoskeletal: normal bulk and tone, no edema  Skin: bilateral groin incisions dermabond in place with honeycomb dressing dry and intact   Recent Labs  Lab 10/03/17 2031  CREATININE 1.07   Recent Labs  Lab 10/03/17 2031  HGB 13.3  HCT 39.5*  WBC 10.5  PLT 211   Dg C-arm 1-60 Min-no Report  Result Date: 10/03/2017 Fluoroscopy was utilized by the requesting physician.  No radiographic interpretation.    ASSESSMENT / PLAN: Atherosclerotic occlusive disease of LLE with claudication and rest pain s/p femoral arty endarterectomies (10/03/17) Delirium/Agitation  Nausea  Hx: Current everyday smoker and Hyperlipidemia  P: Supplemental O2 or prn Bipap for dyspnea and/or hypoxia  Continuous telemetry monitoring  Trend BMP  Replace electrolytes as indicated  Monitor UOP  VTE px: subq lovenox Trend CBC  Monitor for s/sx of bleeding  CIWA protocol ordered by vascular surgery Prn zofran and  phenergan for nausea/vomiting Prn morphine and percocet for pain management  Will order nicotine patch once pts mentation improves will need smoking cessation counseling   Marda Stalker, [place] Pager [phone] (please enter 7 digits) PCCM Consult Pager [phone] (please enter 7 digits)

## 2017-10-03 NOTE — Progress Notes (Signed)
Left pupil large and right pupil pinpoint  Moving all extremity  Good smile   Pt states that he has ha accident to that left eye

## 2017-10-03 NOTE — Progress Notes (Signed)
Pt agitated, removing equipment and gown.  Pt upset - "I can't throw up"  I explained to him that he has not had an substantial food.   Pt given Zofran 4mg  IVP for nausea at 2040.  Also provided him with ginger ale and saltine crackers.  Requesting more anti-nausea medication.  Dr Delana Meyer notified.  Orders received,

## 2017-10-04 ENCOUNTER — Other Ambulatory Visit: Payer: Self-pay

## 2017-10-04 ENCOUNTER — Encounter: Payer: Self-pay | Admitting: Vascular Surgery

## 2017-10-04 LAB — BASIC METABOLIC PANEL
ANION GAP: 9 (ref 5–15)
BUN: 12 mg/dL (ref 8–23)
CHLORIDE: 105 mmol/L (ref 98–111)
CO2: 24 mmol/L (ref 22–32)
Calcium: 8.7 mg/dL — ABNORMAL LOW (ref 8.9–10.3)
Creatinine, Ser: 1.17 mg/dL (ref 0.61–1.24)
GFR calc non Af Amer: >60 mL/min (ref >60)
Glucose, Bld: 152 mg/dL — ABNORMAL HIGH (ref 70–99)
Potassium: 4.1 mmol/L (ref 3.5–5.1)
Sodium: 138 mmol/L (ref 135–145)

## 2017-10-04 LAB — CBC
HEMATOCRIT: 37.3 % — AB (ref 40.0–52.0)
HEMOGLOBIN: 12.9 g/dL — AB (ref 13.0–18.0)
MCH: 33 pg (ref 26.0–34.0)
MCHC: 34.6 g/dL (ref 32.0–36.0)
MCV: 95.4 fL (ref 80.0–100.0)
Platelets: 215 10*3/uL (ref 150–440)
RBC: 3.91 MIL/uL — ABNORMAL LOW (ref 4.40–5.90)
RDW: 14.1 % (ref 11.5–14.5)
WBC: 11.9 10*3/uL — AB (ref 3.8–10.6)

## 2017-10-04 MED ORDER — FAMOTIDINE 20 MG PO TABS
20.0000 mg | ORAL_TABLET | Freq: Two times a day (BID) | ORAL | Status: DC
  Administered 2017-10-04: 20 mg via ORAL
  Filled 2017-10-04: qty 1

## 2017-10-04 MED ORDER — NICOTINE 21 MG/24HR TD PT24
21.0000 mg | MEDICATED_PATCH | Freq: Every day | TRANSDERMAL | Status: DC
  Administered 2017-10-04 – 2017-10-05 (×2): 21 mg via TRANSDERMAL
  Filled 2017-10-04 (×2): qty 1

## 2017-10-04 NOTE — Progress Notes (Signed)
Unable to doppler a pulse to the patients left foot when he arrived from ICU.  Message left with Dr Lucky Cowboy in vascular.  PA Joelene Millin came to assess the patient and will get back to Dr Lucky Cowboy

## 2017-10-04 NOTE — Evaluation (Signed)
Occupational Therapy Evaluation Patient Details Name: Jerry [name] MRN: [id number] DOB: 12-10-1945 Today's Date: 10/04/2017    History of Present Illness Pt. is a 72 yo male, status post Left common femoral, profunda femoris, and superficial femoral artery endarterectomies and patch angioplasty on 10/03/2017. History of hypertension, smoker, and reports fall 3 months prior.   Clinical Impression   Pt seen for OT evaluation this date. Prior to hospital admission, pt was independent with ADLs/IADLs, worked doing Sports coach, and was driving.  Pt lives in one level home with spouse and uses no equipment for mobility or ADLs.  Currently pt demonstrates impairments in standing balance and tolerance requiring CGA and FWW for ADL mobility.  Pt performs UB seated ADLs with total Indep and for LB ADLs, he requires increased time. Education provided about modified technique including option of AE, however, pt ultimately able to perform with therapist-facilitated cross-leg technique at MOD I level. Upon hospital discharge, pt is appropriate to go home from OT standpoint, no further OT needs detected at this time.    Follow Up Recommendations  No OT follow up    Equipment Recommendations       Recommendations for Other Services       Precautions / Restrictions Precautions Precautions: Fall Restrictions Weight Bearing Restrictions: No      Mobility Bed Mobility Overal bed mobility: Modified Independent             General bed mobility comments: HOB flat, pt utilized bedrail briefly, when asked to attempt without, he was able to, but required increased time to advance LEs to EOB.   Transfers Overall transfer level: Needs assistance Equipment used: Rolling walker (2 wheeled) Transfers: Sit to/from Stand Sit to Stand: Min guard         General transfer comment: Pt initially provided CGA to stand for safety and steadiness, but ultimately demonstrates ability to take 2-3 side  steps with FWW with supervision only.     Balance Overall balance assessment: Needs assistance   Sitting balance-Leahy Scale: Normal     Standing balance support: Bilateral upper extremity supported Standing balance-Leahy Scale: Fair                             ADL either performed or assessed with clinical judgement   ADL Overall ADL's : Modified independent                                       General ADL Comments: Pt only demonstrated difficulty with LB dressing on eval as evidenced by requiring increased time to don/doff socks. Introduced to AE if he prefers, but demonstrates ability to don/doff socks/thread pants with cross-leg modified technique. Pt performs all seated UB ADLs with total independence.      Vision Patient Visual Report: No change from baseline       Perception     Praxis      Pertinent Vitals/Pain Pain Assessment: 0-10 Pain Score: 3  Pain Location: Left ant. thigh surgical site, only when abd/add for bed mobility in prep for sup to sit.  Pain Descriptors / Indicators: Discomfort Pain Intervention(s): Limited activity within patient's tolerance;Monitored during session     Hand Dominance Right   Extremity/Trunk Assessment Upper Extremity Assessment Upper Extremity Assessment: Overall WFL for tasks assessed   Lower Extremity Assessment Lower Extremity Assessment: Defer to PT  evaluation   Cervical / Trunk Assessment Cervical / Trunk Assessment: Normal   Communication Communication Communication: No difficulties   Cognition Arousal/Alertness: Awake/alert Behavior During Therapy: WFL for tasks assessed/performed Overall Cognitive Status: Within Functional Limits for tasks assessed                                 General Comments: Cognition was [facility] throughout session, except at end of session pt. stated the "floor is wiggling". PT was able to get pt. back on task via tactile and verbal cueing.    General Comments       Exercises Other Exercises Other Exercises: Pt educated on opeiton to utilize AE for LB dressing and verbalizes understanding after technique is demo'd. Pt ultimately, given extra time, demonstrates ability to perform LB dressing with therapist-facilitated, cross-leg technqiue in long sitting.  Other Exercises: Pt educated on falls prevention strategies to apply to home environment.  Other Exercises: Pt educated on importance and purpose of stretching to maintain LE flexibility as it applies to ADLs-preventing scar tissue from healing in a fashion that inhibits LB dressing/bathing skills.    Shoulder Instructions      Home Living Family/patient expects to be discharged to:: Private residence Living Arrangements: Spouse/significant other Available Help at Discharge: Family Type of Home: House Home Access: Stairs to enter Technical brewer of Steps: 1 Entrance Stairs-Rails: None Home Layout: One level     Bathroom Shower/Tub: Teacher, early years/pre: Standard     Home Equipment: None          Prior Functioning/Environment Level of Independence: Independent        Comments: (still works Sports coach)        OT Problem List: Decreased strength;Decreased range of motion;Decreased activity tolerance;Impaired balance (sitting and/or standing)      OT Treatment/Interventions:      OT Goals(Current goals can be found in the care plan section) Acute Rehab OT Goals Patient Stated Goal: Pt wants to get out of the hospital OT Goal Formulation: All assessment and education complete, DC therapy  OT Frequency:     Barriers to D/C:            Co-evaluation              AM-PAC PT "6 Clicks" Daily Activity     Outcome Measure Help from another person eating meals?: None Help from another person taking care of personal grooming?: None Help from another person toileting, which includes using toliet, bedpan, or urinal?: A  Little Help from another person bathing (including washing, rinsing, drying)?: A Little Help from another person to put on and taking off regular upper body clothing?: None Help from another person to put on and taking off regular lower body clothing?: None 6 Click Score: 22   End of Session Equipment Utilized During Treatment: Gait belt;Rolling walker Nurse Communication: Mobility status  Activity Tolerance: Patient tolerated treatment well Patient left: in bed;with call bell/phone within reach;with bed alarm set  OT Visit Diagnosis: Unsteadiness on feet (R26.81);Muscle weakness (generalized) (M62.81)                Time: 8657-8469 OT Time Calculation (min): 26 min Charges:  OT General Charges $OT Visit: 1 Visit OT Evaluation $OT Eval Low Complexity: 1 Low OT Treatments $Self Care/Home Management : 8-22 mins Gerrianne Scale, MS, OTR/L ascom [phone] or [phone] 10/04/17, 2:55 PM

## 2017-10-04 NOTE — Progress Notes (Signed)
PHARMACIST - PHYSICIAN COMMUNICATION  DR:   Mortimer Fries  CONCERNING: IV to Oral Route Change Policy  RECOMMENDATION: This patient is receiving famotidine by the intravenous route.  Based on criteria approved by the Pharmacy and Therapeutics Committee, the intravenous medication(s) is/are being converted to the equivalent oral dose form(s).   DESCRIPTION: These criteria include:  The patient is eating (either orally or via tube) and/or has been taking other orally administered medications for a least 24 hours  The patient has no evidence of active gastrointestinal bleeding or impaired GI absorption (gastrectomy, short bowel, patient on TNA or NPO).  If you have questions about this conversion, please contact the Pharmacy Department  []   [phone] )  [facility] [x]   [phone] )  [facility] []   [phone] )  [facility] []   [phone] )  [facility] []   [phone] )  Straughn, [facility] 10/04/2017 7:49 AM

## 2017-10-04 NOTE — Anesthesia Postprocedure Evaluation (Signed)
Anesthesia Post Note  Patient: Jerry [name]  Procedure(s) Performed: ENDARTERECTOMY FEMORAL (Left ) ANGIOPLASTY ILIAC ARTERY (Left )  Patient location during evaluation: ICU Anesthesia Type: General Level of consciousness: awake and alert and oriented Pain management: pain level controlled Vital Signs Assessment: post-procedure vital signs reviewed and stable Respiratory status: spontaneous breathing and nonlabored ventilation Cardiovascular status: stable Postop Assessment: no apparent nausea or vomiting and adequate PO intake Anesthetic complications: no     Last Vitals:  Vitals:   10/04/17 0500 10/04/17 0600  BP: (!) 118/93 123/64  Pulse: 65 66  Resp: 13 (!) 8  Temp:    SpO2: 98% 96%    Last Pain:  Vitals:   10/04/17 0400  TempSrc: Axillary  PainSc: Asleep                 Lanora Manis

## 2017-10-04 NOTE — Progress Notes (Signed)
[facility]  Daily Progress Note  Subjective: 1 Day Post-Op: 1.Left common femoral, profunda femoris, and superficial femoral artery endarterectomies and patch angioplasty, Ultrasound guidance for vascular access right femoral artery, Catheter placement into right external iliac artery from left femoral approach and catheter placement into left common femoral artery from right femoral approach, Aortogram and left iliofemoral angiogram, Kissing balloon stent placement to the distal aorta and proximal common iliac arteries with 7 mm diameter by 28 mm length lifestream stent on the right and 7 mm diameter by 58 mm length lifestream stent on the left, Stent placement to the right common iliac artery with 7 mm diameter by 28 mm length lifestream stent just below the previous stent remaining in the common iliac artery, Lifestream stent placement in the left common iliac artery and proximal external iliac artery with 7 mm diameter by 37 mm length lifestream stent 8.  Viabahn stent placement to the left external iliac artery with 8 mm diameter by 10 cm length Viabahn stent  Patient with some soreness to the left groin. Denies any left lower extremity pain. Patient asking to eat - states he is hungry.   Objective: Vitals:   10/04/17 0800 10/04/17 0900 10/04/17 1000 10/04/17 1049  BP: 123/63 99/84 (!) 114/52 119/69  Pulse: 71 62 (!) 59 (!) 55  Resp: 12 (!) 8 (!) 9 18  Temp: 99.1 F (37.3 C)   98.1 F (36.7 C)  TempSrc: Oral   Oral  SpO2: 96% 97% 99% 99%  Weight:      Height:        Intake/Output Summary (Last 24 hours) at 10/04/2017 1359 Last data filed at 10/04/2017 1036 Gross per 24 hour  Intake 3980 ml  Output 1790 ml  Net 2190 ml   Physical Exam: A&Ox3, NAD CV: RRR Pulmonary: CTA Bilaterally Abdomen: Soft, Nontender, Nondistended Vascular:  Left Groin: incision with OR dressing in place, clean and dry  Left lower extremity: thigh soft, calf soft foot warm  Hard  to palpate pedal pulses but dopplerable PT pulse   Laboratory: CBC    Component Value Date/Time   WBC 11.9 (H) 10/04/2017 0551   HGB 12.9 (L) 10/04/2017 0551   HGB 17.5 07/18/2011 0934   HCT 37.3 (L) 10/04/2017 0551   HCT 53.1 (H) 07/18/2011 0934   PLT 215 10/04/2017 0551   PLT 267 07/18/2011 0934   BMET    Component Value Date/Time   NA 138 10/04/2017 0551   NA 139 07/18/2011 0934   K 4.1 10/04/2017 0551   K 4.4 07/18/2011 0934   CL 105 10/04/2017 0551   CL 103 07/18/2011 0934   CO2 24 10/04/2017 0551   CO2 25 07/18/2011 0934   GLUCOSE 152 (H) 10/04/2017 0551   GLUCOSE 183 (H) 07/18/2011 0934   BUN 12 10/04/2017 0551   BUN 23 (H) 07/18/2011 0934   CREATININE 1.17 10/04/2017 0551   CREATININE 1.47 (H) 07/18/2011 0934   CALCIUM 8.7 (L) 10/04/2017 0551   CALCIUM 10.1 07/18/2011 0934   GFRNONAA >60 10/04/2017 0551   GFRNONAA 49 (L) 07/18/2011 0934   GFRAA >60 10/04/2017 0551   GFRAA 56 (L) 07/18/2011 0934   Assessment/Planning: 72 year old male s/p left femoral endarerectomy POD #1 1) Planning on discharge home tomorrow 2) Pain control 3) Advance Diet 4) Ambulation  Marcelle Overlie PA-C 10/04/2017 1:59 PM

## 2017-10-04 NOTE — Evaluation (Signed)
Physical Therapy Evaluation Patient Details Name: Jerry [name] MRN: [id number] DOB: [birth year] Today's Date: 10/04/2017   History of Present Illness  Pt. is a 72 yo male, status post Left common femoral, profunda femoris, and superficial femoral artery endarterectomies and patch angioplasty on 10/03/2017. History of hypertension, smoker, and reports fall 3 months prior.  Clinical Impression  Pt needed modified independence with bed mobility via elevated head rest, but should progress to flat surface. Pt needed CGA with transfer from sit to stand and ambulation. PT noted mild confusion at end of session after pt. Walked 40 feet that was not otherwise present, continue to monitor cognitive status. Pt showed deficits with sit to stand and ambulation secondary to dizziness, pain, cognition, and understanding of proper RW usage. Pt. Will benefit from physical therapy to assess 1 step mobility for home, increased endurance for walking and ADL at home. At this time pt is limited by pain and will continue to benefit from PT as pain decreases. After d/c pt will benefit from home health to address to deficits stated above.    Follow Up Recommendations Home health PT    Equipment Recommendations  Rolling walker with 5" wheels    Recommendations for Other Services       Precautions / Restrictions Precautions Precautions: Fall Restrictions Weight Bearing Restrictions: No      Mobility  Bed Mobility Overal bed mobility: Modified Independent             General bed mobility comments: pt. used elevated head rest and needed extra time.   Transfers Overall transfer level: Needs assistance Equipment used: Rolling walker (2 wheeled) Transfers: Sit to/from Stand Sit to Stand: Min guard         General transfer comment: Pt. reported dizziness when first sitting EOB. Cueing needed for appropraite use of RW.  Ambulation/Gait Ambulation/Gait assistance: Min guard Gait Distance (Feet): 40  Feet Assistive device: Rolling walker (2 wheeled) Gait Pattern/deviations: Step-through pattern;Decreased step length - right;Decreased step length - left     General Gait Details: Pt. was limited to 40 feet secondary to pain at/near surgical site. Pt. needed minimal verbal cueing for safe use of RW.  Stairs Stairs: (not assesed at this time, secondary to pain/fatigue.)          Wheelchair Mobility    Modified Rankin (Stroke Patients Only)       Balance Overall balance assessment: History of Falls;Mild deficits observed, not formally tested                                           Pertinent Vitals/Pain Pain Assessment: 0-10 Pain Score: 1 (at rest, but pain increases with abduction exercises/ gait) Pain Location: Left ant. thigh surgical site Pain Descriptors / Indicators: Discomfort Pain Intervention(s): Limited activity within patient's tolerance;Repositioned    Home Living Family/patient expects to be discharged to:: Private residence Living Arrangements: Spouse/significant other Available Help at Discharge: Family Type of Home: House Home Access: Other (comment)(one step to enter residence)     Home Layout: One level Home Equipment: None      Prior Function Level of Independence: Independent         Comments: (still works Sports coach)     Journalist, newspaper   Dominant Hand: Right    Extremity/Trunk Assessment   Upper Extremity Assessment Upper Extremity Assessment: Overall WFL for tasks assessed  Lower Extremity Assessment Lower Extremity Assessment: Overall WFL for tasks assessed       Communication   Communication: No difficulties  Cognition Arousal/Alertness: Awake/alert Behavior During Therapy: WFL for tasks assessed/performed Overall Cognitive Status: Within Functional Limits for tasks assessed                                 General Comments: Cognition was [facility] throughout session, except at end of  session pt. stated the "floor is wiggling". PT was able to get pt. back on task via tactile and verbal cueing.      General Comments      Exercises General Exercises - Lower Extremity Ankle Circles/Pumps: AROM;Both;10 reps;Supine Quad Sets: Both;10 reps;Supine Hip ABduction/ADduction: AAROM;Supine;Right;10 reps;Left;5 reps(increased assistance needed with L LE) Other Exercises Other Exercises: SLR B 10 reps with no assistance needed for AROM on R LE and AAROM on L LE.   Assessment/Plan    PT Assessment Patient needs continued PT services  PT Problem List Decreased activity tolerance;Decreased balance;Decreased mobility;Decreased cognition;Decreased knowledge of use of DME;Decreased safety awareness;Pain       PT Treatment Interventions DME instruction;Gait training;Stair training;Therapeutic activities;Therapeutic exercise;Balance training;Cognitive remediation;Patient/family education;Functional mobility training;Neuromuscular re-education    PT Goals (Current goals can be found in the Care Plan section)  Acute Rehab PT Goals Patient Stated Goal: pt. wants to return to prior level of function PT Goal Formulation: With patient Time For Goal Achievement: 10/18/17 Potential to Achieve Goals: Good    Frequency Min 2X/week   Barriers to discharge        Co-evaluation               AM-PAC PT "6 Clicks" Daily Activity  Outcome Measure Difficulty turning over in bed (including adjusting bedclothes, sheets and blankets)?: None Difficulty moving from lying on back to sitting on the side of the bed? : None Difficulty sitting down on and standing up from a chair with arms (e.g., wheelchair, bedside commode, etc,.)?: Unable Help needed moving to and from a bed to chair (including a wheelchair)?: A Little Help needed walking in hospital room?: A Little Help needed climbing 3-5 steps with a railing? : A Little 6 Click Score: 18    End of Session Equipment Utilized During  Treatment: Gait belt Activity Tolerance: Patient tolerated treatment well;Patient limited by pain Patient left: in chair;with call bell/phone within reach;with chair alarm set;with SCD's reapplied(SCD to non surgical leg only) Nurse Communication: Mobility status PT Visit Diagnosis: Other abnormalities of gait and mobility (R26.89);Pain;Unsteadiness on feet (R26.81);Repeated falls (R29.6);History of falling (Z91.81);Difficulty in walking, not elsewhere classified (R26.2) Pain - Right/Left: Left Pain - part of body: Hip    Time: 1110-1145 PT Time Calculation (min) (ACUTE ONLY): 35 min   Charges:         PT G Codes:        Rosario Adie, SPT  Rosario Adie 10/04/2017, 2:07 PM

## 2017-10-04 NOTE — Progress Notes (Signed)
Patient just arrived from ICU

## 2017-10-04 NOTE — Progress Notes (Signed)
OT Cancellation Note  Patient Details Name: Jerry [name] MRN: [id number] DOB: [birth year]   Cancelled Treatment:    Reason Eval/Treat Not Completed: Other (comment)(Per RN, pt to t/f to floor soon, requests hold until t/f completed). Will re-attempt Ot eval at later time as pt is available.   Gerrianne Scale, MS, OTR/L ascom [phone] or [phone] 10/04/17, 10:10 AM

## 2017-10-04 NOTE — Progress Notes (Signed)
Report given to receiving RN, Danae Chen with no further questions.   Belongings packed to transfer with patient.

## 2017-10-05 LAB — BASIC METABOLIC PANEL
Anion gap: 8 (ref 5–15)
BUN: 15 mg/dL (ref 8–23)
CHLORIDE: 107 mmol/L (ref 98–111)
CO2: 26 mmol/L (ref 22–32)
CREATININE: 1.05 mg/dL (ref 0.61–1.24)
Calcium: 8.9 mg/dL (ref 8.9–10.3)
GFR calc Af Amer: >60 mL/min (ref >60)
GFR calc non Af Amer: >60 mL/min (ref >60)
GLUCOSE: 98 mg/dL (ref 70–99)
Potassium: 3.7 mmol/L (ref 3.5–5.1)
Sodium: 141 mmol/L (ref 135–145)

## 2017-10-05 LAB — CBC
HEMATOCRIT: 32.5 % — AB (ref 40.0–52.0)
HEMOGLOBIN: 11.5 g/dL — AB (ref 13.0–18.0)
MCH: 33.5 pg (ref 26.0–34.0)
MCHC: 35.3 g/dL (ref 32.0–36.0)
MCV: 94.9 fL (ref 80.0–100.0)
Platelets: 184 10*3/uL (ref 150–440)
RBC: 3.43 MIL/uL — ABNORMAL LOW (ref 4.40–5.90)
RDW: 14.4 % (ref 11.5–14.5)
WBC: 9.2 10*3/uL (ref 3.8–10.6)

## 2017-10-05 LAB — MAGNESIUM: Magnesium: 1.9 mg/dL (ref 1.7–2.4)

## 2017-10-05 LAB — SURGICAL PATHOLOGY

## 2017-10-05 MED ORDER — NICOTINE 21 MG/24HR TD PT24: 21.0000 mg | MEDICATED_PATCH | Freq: Every day | TRANSDERMAL | 1 refills | Status: DC

## 2017-10-05 MED ORDER — CLOPIDOGREL BISULFATE 75 MG PO TABS: 75.0000 mg | ORAL_TABLET | Freq: Every day | ORAL | 5 refills | Status: DC

## 2017-10-05 MED ORDER — CLOPIDOGREL BISULFATE 75 MG PO TABS
75.0000 mg | ORAL_TABLET | Freq: Every day | ORAL | Status: DC
Start: 2017-10-05 — End: 2017-10-05

## 2017-10-05 MED ORDER — OXYCODONE-ACETAMINOPHEN 5-325 MG PO TABS: ORAL_TABLET | ORAL | 0 refills | Status: DC

## 2017-10-05 NOTE — Progress Notes (Addendum)
Pt is a high fall risk per proctol due to pt falling 3 months ago. Pt refused nursing staff to help him ambulate in the hallway. Pt ambulated 320 ft with walker with family member. Pt was steady on his feet and ambulated well with walker. Pt was educated on the importance of notifying nursing staff to help him while ambulating when he feels weak, dizzy, or lightheaded. Pt refuses bed alarm and chair alarm. RN will continue to encourage ambulation.   Alexzandrea Normington [employer]

## 2017-10-05 NOTE — Discharge Instructions (Signed)
You may shower.  Please keep your incisions clean and dry. Please do not drive while taking pain medication or until you follow-up in our office for your first postoperative follow-up. Please no lifting heavier than 10 pounds or engaging in any strenuous activities until your first follow-up in our office.

## 2017-10-05 NOTE — Care Management Important Message (Signed)
Copy of signed IM left with patient in room.  

## 2017-10-05 NOTE — Discharge Summary (Signed)
[facility]    Discharge Summary    Patient ID:  Jerry [name] MRN: [id number] DOB/AGE: 74-Dec-1947 74 y.o.  Admit date: 10/03/2017 Discharge date: 10/05/2017 Date of Surgery: 10/03/2017 Surgeon: Surgeon(s): Dew, Erskine Squibb, MD  Admission Diagnosis: ASO WITH CLAUDICATION  Discharge Diagnoses:  ASO WITH CLAUDICATION  Secondary Diagnoses: Past Medical History:  Diagnosis Date  . Anemia    patient unaware of this diagnosis  . Cigarette smoker   . Hearing loss   . Hyperlipidemia   . Peripheral vascular disease (HCC)    blockages in both extremities  . Vitamin D deficiency    patient unaware of this diagnosis   Procedure(s): ENDARTERECTOMY FEMORAL ANGIOPLASTY ILIAC ARTERY  Discharged Condition: good  HPI:  The patient is a 72 year old male who presents with severe PAD and worsening claudication and symptoms of early rest pain.  He has had previous femoral to femoral bypass which is occluded and he has bifurcation disease of the left femoral as well as the known left iliac occlusion.  On October 03, 2017, the patient underwent Left common femoral, profunda femoris, and superficial femoral artery endarterectomies and patch angioplasty, Ultrasound guidance for vascular access right femoral artery, Catheter placement into right external iliac artery from left femoral approach and catheter placement into left common femoral artery from right femoral approach, Aortogram and left iliofemoral angiogram, Kissing balloon stent placement to the distal aorta and proximal common iliac arteries with 7 mm diameter by 28 mm length lifestream stent on the right and 7 mm diameter by 58 mm length lifestream stent on the left, Stent placement to the right common iliac artery with 7 mm diameter by 28 mm length lifestream stent just below the previous stent remaining in the common iliac artery, Lifestream stent placement in the left common iliac artery and proximal external iliac artery  with 7 mm diameter by 37 mm length lifestream stent, Viabahn stent placement to the left external iliac artery with 8 mm diameter by 10 cm length Viabahn stent.  The patient tolerated the procedure well and was transferred to the surgical floor without comp occasion.  The patient's night of surgery was unremarkable.  During the patient's brief operative stay his diet was advanced, his Foley was removed and he is urinating without issue, the patient is passing gas, the patient's pain is controlled to the use of p.o. pain medication and the patient is ambulating independently.  Hospital Course:  ROHN FRITSCH is a 72 y.o. male is S/P Left:  Procedure(s): ENDARTERECTOMY FEMORAL ANGIOPLASTY ILIAC ARTERY  Extubated: POD # 0  Physical exam:  A&Ox3, NAD CV: RRR Pulm: CTA Bilaterally Abdomen: Soft, Non-tender, Non-distended, (+) Bowel Sounds Bilateral Groins: Aware dressings removed.  Bilateral incisions intact clean and dry.  Dermabond in place.  No ecchymosis noted.  No swelling or drainage noted. Bilateral lower extremity: Dopplerable PT pulses.  Bilateral feet are warm.  Post-op wounds clean, dry, intact or healing well  Pt. Ambulating, voiding and taking PO diet without difficulty.  Pt pain controlled with PO pain meds.  Labs as below  Complications:none  Consults:  None  Significant Diagnostic Studies: CBC Lab Results  Component Value Date   WBC 9.2 10/05/2017   HGB 11.5 (L) 10/05/2017   HCT 32.5 (L) 10/05/2017   MCV 94.9 10/05/2017   PLT 184 10/05/2017   BMET    Component Value Date/Time   NA 141 10/05/2017 0521   NA 139 07/18/2011 0934   K 3.7 10/05/2017  0521   K 4.4 07/18/2011 0934   CL 107 10/05/2017 0521   CL 103 07/18/2011 0934   CO2 26 10/05/2017 0521   CO2 25 07/18/2011 0934   GLUCOSE 98 10/05/2017 0521   GLUCOSE 183 (H) 07/18/2011 0934   BUN 15 10/05/2017 0521   BUN 23 (H) 07/18/2011 0934   CREATININE 1.05 10/05/2017 0521   CREATININE 1.47 (H)  07/18/2011 0934   CALCIUM 8.9 10/05/2017 0521   CALCIUM 10.1 07/18/2011 0934   GFRNONAA >60 10/05/2017 0521   GFRNONAA 49 (L) 07/18/2011 0934   GFRAA >60 10/05/2017 0521   GFRAA 56 (L) 07/18/2011 0934   COAG Lab Results  Component Value Date   INR 0.89 09/25/2017   Disposition:  Discharge to :Home  Allergies as of 10/05/2017   No Known Allergies     Medication List    TAKE these medications   acetaminophen 325 MG tablet Commonly known as:  TYLENOL Take 650 mg by mouth every 6 (six) hours as needed for moderate pain.   aspirin EC 81 MG tablet Take 81 mg by mouth daily.   atorvastatin 10 MG tablet Commonly known as:  LIPITOR Take 1 tablet (10 mg total) by mouth daily.   clopidogrel 75 MG tablet Commonly known as:  PLAVIX Take 1 tablet (75 mg total) by mouth daily.   multivitamin capsule Take 1 capsule by mouth as needed.   nicotine 21 mg/24hr patch Commonly known as:  NICODERM CQ - dosed in mg/24 hours Place 1 patch (21 mg total) onto the skin daily. Start taking on:  10/06/2017   oxyCODONE-acetaminophen 5-325 MG tablet Commonly known as:  PERCOCET/ROXICET One to Two Tabs Every Six Hours As Needed For Pain      Verbal and written Discharge instructions given to the patient. Wound care per Discharge AVS Follow-up Information    Cyenna Rebello, Janalyn Harder, PA-C. Go on 10/12/2017.   Specialty:  Vascular Surgery Why:  @10 :45a  First Post-Op Check Contact information: [place] [address] [phone]          Signed: Sela Hua, PA-C  10/05/2017, 2:58 PM

## 2017-10-05 NOTE — Progress Notes (Signed)
Patient discharge teaching given, including activity, diet, follow-up appoints, and medications. Patient verbalized understanding of all discharge instructions. pt eager to leave and did not wait til plavix PO was verified by pharmacy, RN gave pt prescription for plavix PO and needs to take a dose when filled. Pt and pt.'s spouse verbalizes understanding of medications.  IV accesses was d/c'd. Vitals are stable. Skin is intact except as charted in most recent assessments. Pt to be escorted out by NT, to be driven home by family.  Yazaira Speas [employer]

## 2017-10-12 ENCOUNTER — Ambulatory Visit (INDEPENDENT_AMBULATORY_CARE_PROVIDER_SITE_OTHER): Payer: Medicare HMO | Admitting: Vascular Surgery

## 2017-10-12 ENCOUNTER — Encounter (INDEPENDENT_AMBULATORY_CARE_PROVIDER_SITE_OTHER): Payer: Self-pay | Admitting: Vascular Surgery

## 2017-10-12 VITALS — BP 129/80 | HR 64 | Resp 16 | Ht 65.5 in | Wt 126.4 lb

## 2017-10-12 DIAGNOSIS — I70219 Atherosclerosis of native arteries of extremities with intermittent claudication, unspecified extremity: Secondary | ICD-10-CM

## 2017-10-12 NOTE — Progress Notes (Signed)
Patient ID: Jerry [name], male   DOB: 27-Sep-1945, 72 y.o.   MRN: [id number]  Chief Complaint  Patient presents with  . Follow-up    [facility] 2 week    HPI Jerry [name] is a 72 y.o. male.  Patient returns in follow-up of his PAD.  His left leg is feeling better after extensive revascularization.  He is still somewhat sore but not that bad.  His wound is healing well.  His claudication symptoms are improved.  He says he is stop smoking and he is congratulated on this.   Past Medical History:  Diagnosis Date  . Anemia    patient unaware of this diagnosis  . Cigarette smoker   . Hearing loss   . Hyperlipidemia   . Peripheral vascular disease (HCC)    blockages in both extremities  . Vitamin D deficiency    patient unaware of this diagnosis    Past Surgical History:  Procedure Laterality Date  . ANGIOPLASTY ILLIAC ARTERY Left 10/03/2017   Procedure: ANGIOPLASTY ILIAC ARTERY;  Surgeon: Algernon Huxley, MD;  Location: [facility] ORS;  Service: Vascular;  Laterality: Left;  . BACK SURGERY  1990   Three sx in lower back. metal in lower back  . COLONOSCOPY  01/2010  . ENDARTERECTOMY FEMORAL Left 10/03/2017   Procedure: ENDARTERECTOMY FEMORAL;  Surgeon: Algernon Huxley, MD;  Location: [facility] ORS;  Service: Vascular;  Laterality: Left;  . Left Leg stent Left 2009   fem fem bypass  . LOWER EXTREMITY ANGIOGRAPHY Left 09/19/2017   Procedure: LOWER EXTREMITY ANGIOGRAPHY;  Surgeon: Algernon Huxley, MD;  Location: [place] [place] [facility];  Service: Cardiovascular;  Laterality: Left;  . PTCA Right 08/2017  . VASCULAR SURGERY Left 2009   fem fem bypass       No Known Allergies  Current Outpatient Medications  Medication Sig Dispense Refill  . acetaminophen (TYLENOL) 325 MG tablet Take 650 mg by mouth every 6 (six) hours as needed for moderate pain.    Marland Kitchen aspirin EC 81 MG tablet Take 81 mg by mouth daily.    Marland Kitchen atorvastatin (LIPITOR) 10 MG tablet Take 1 tablet (10 mg total) by mouth daily. 30 tablet 11  .  cholecalciferol (VITAMIN D) 1000 units tablet Take 1,000 Units by mouth daily.    . clopidogrel (PLAVIX) 75 MG tablet Take 1 tablet (75 mg total) by mouth daily. 30 tablet 5  . Multiple Vitamin (MULTIVITAMIN) capsule Take 1 capsule by mouth as needed.     . nicotine (NICODERM CQ - DOSED IN MG/24 HOURS) 21 mg/24hr patch Place 1 patch (21 mg total) onto the skin daily. 28 patch 1  . oxyCODONE-acetaminophen (PERCOCET/ROXICET) 5-325 MG tablet One to Two Tabs Every Six Hours As Needed For Pain (Patient not taking: Reported on 10/12/2017) 50 tablet 0   No current facility-administered medications for this visit.         Physical Exam BP 129/80 (BP Location: Right Arm)   Pulse 64   Resp 16   Ht 5' 5.5" (1.664 m)   Wt 126 lb 6.4 oz (57.3 kg)   BMI 20.71 kg/m  Gen:  WD/WN, NAD Skin: incision C/D/I     Assessment/Plan:  Atherosclerosis with claudication of extremity (HCC) Symptoms appear significantly improved after left femoral endarterectomy and left iliac stent placements.  Continue Plavix, aspirin, and Lipitor for at least 3 months.  Recheck with noninvasive studies in 3 months.      [name] 10/12/2017, 1:54  PM   This note was created with Dragon medical transcription system.  Any errors from dictation are unintentional.

## 2017-10-12 NOTE — Assessment & Plan Note (Signed)
Symptoms appear significantly improved after left femoral endarterectomy and left iliac stent placements.  Continue Plavix, aspirin, and Lipitor for at least 3 months.  Recheck with noninvasive studies in 3 months.

## 2017-11-05 ENCOUNTER — Telehealth (INDEPENDENT_AMBULATORY_CARE_PROVIDER_SITE_OTHER): Payer: Self-pay

## 2017-11-05 NOTE — Telephone Encounter (Signed)
Patients wife called stating the patient is urinating more and the patient thinks its because of his surgery or the medications he was placed on. The patient had a femoral endarterectomy and was placed on Lipitor and Plavix, neither of these would created increased urination so the patient was advised to see his PCP for evaluation. I did speak with Hezzie Bump PA.

## 2017-11-27 ENCOUNTER — Telehealth (INDEPENDENT_AMBULATORY_CARE_PROVIDER_SITE_OTHER): Payer: Self-pay

## 2017-11-27 NOTE — Telephone Encounter (Signed)
Patient informed his [facility] nurse that he is now having Right leg discomfort and he states that he is also having some bladder and incontinence issues as of the last few days.  Patient states that he has an appointment scheduled for next month, but thinks he needs to be seen before then?

## 2017-11-28 ENCOUNTER — Telehealth (INDEPENDENT_AMBULATORY_CARE_PROVIDER_SITE_OTHER): Payer: Self-pay

## 2017-11-28 NOTE — Telephone Encounter (Signed)
Joelene Millin from [facility] sent an e-mail stating that the patient was about to be discharged when he started complaining of calf pain and urinary retention. The calf pain is located on the left leg, he had a left  femoral endarterectomy on 10/03/17.

## 2017-12-03 ENCOUNTER — Telehealth: Payer: Self-pay

## 2017-12-03 NOTE — Telephone Encounter (Signed)
Message left for patient to call back to see if he was ready to go ahead and schedule his CT abdomen/pelvis with contrast.

## 2017-12-06 ENCOUNTER — Encounter: Payer: Self-pay | Admitting: Emergency Medicine

## 2017-12-06 ENCOUNTER — Other Ambulatory Visit: Payer: Self-pay

## 2017-12-06 ENCOUNTER — Emergency Department
Admission: EM | Admit: 2017-12-06 | Discharge: 2017-12-06 | Disposition: A | Discharge status: Home / Self Care | Payer: No Typology Code available for payment source | Attending: Emergency Medicine | Admitting: Emergency Medicine

## 2017-12-06 DIAGNOSIS — M79604 Pain in right leg: Secondary | ICD-10-CM | POA: Insufficient documentation

## 2017-12-06 DIAGNOSIS — Z7982 Long term (current) use of aspirin: Secondary | ICD-10-CM | POA: Diagnosis not present

## 2017-12-06 DIAGNOSIS — Z7901 Long term (current) use of anticoagulants: Secondary | ICD-10-CM | POA: Insufficient documentation

## 2017-12-06 DIAGNOSIS — Z79899 Other long term (current) drug therapy: Secondary | ICD-10-CM | POA: Diagnosis not present

## 2017-12-06 DIAGNOSIS — Z87891 Personal history of nicotine dependence: Secondary | ICD-10-CM | POA: Insufficient documentation

## 2017-12-06 MED ORDER — IBUPROFEN 400 MG PO TABS
400.0000 mg | ORAL_TABLET | Freq: Once | ORAL | Status: AC
  Administered 2017-12-06: 400 mg via ORAL
  Filled 2017-12-06: qty 1

## 2017-12-06 MED ORDER — IBUPROFEN 600 MG PO TABS: 600.0000 mg | ORAL_TABLET | Freq: Three times a day (TID) | ORAL | 0 refills | Status: DC | PRN

## 2017-12-06 MED ORDER — TRAMADOL HCL 50 MG PO TABS: 50.0000 mg | ORAL_TABLET | Freq: Two times a day (BID) | ORAL | 0 refills | Status: DC | PRN

## 2017-12-06 MED ORDER — CYCLOBENZAPRINE HCL 10 MG PO TABS
10.0000 mg | ORAL_TABLET | Freq: Once | ORAL | Status: AC
  Administered 2017-12-06: 10 mg via ORAL
  Filled 2017-12-06: qty 1
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
## 2017-12-18 ENCOUNTER — Ambulatory Visit (INDEPENDENT_AMBULATORY_CARE_PROVIDER_SITE_OTHER): Payer: Medicare HMO

## 2017-12-18 ENCOUNTER — Ambulatory Visit (INDEPENDENT_AMBULATORY_CARE_PROVIDER_SITE_OTHER): Payer: Medicare HMO | Admitting: Vascular Surgery

## 2017-12-18 ENCOUNTER — Encounter (INDEPENDENT_AMBULATORY_CARE_PROVIDER_SITE_OTHER): Payer: Self-pay | Admitting: Vascular Surgery

## 2017-12-18 ENCOUNTER — Other Ambulatory Visit (INDEPENDENT_AMBULATORY_CARE_PROVIDER_SITE_OTHER): Payer: Self-pay | Admitting: Vascular Surgery

## 2017-12-18 VITALS — BP 118/69 | HR 67 | Resp 13 | Ht 64.0 in | Wt 134.0 lb

## 2017-12-18 DIAGNOSIS — I70219 Atherosclerosis of native arteries of extremities with intermittent claudication, unspecified extremity: Secondary | ICD-10-CM

## 2017-12-18 DIAGNOSIS — I70213 Atherosclerosis of native arteries of extremities with intermittent claudication, bilateral legs: Secondary | ICD-10-CM

## 2017-12-18 DIAGNOSIS — Z95820 Peripheral vascular angioplasty status with implants and grafts: Secondary | ICD-10-CM | POA: Diagnosis not present

## 2017-12-18 DIAGNOSIS — R32 Unspecified urinary incontinence: Secondary | ICD-10-CM

## 2017-12-18 DIAGNOSIS — E785 Hyperlipidemia, unspecified: Secondary | ICD-10-CM

## 2017-12-18 NOTE — Assessment & Plan Note (Signed)
His noninvasive studies show some mild improvement in his left ABI now up to 0.78 with good biphasic waveforms.  His right ABI has dropped to 0.44 with monophasic waveforms.  His right ABI was normal previously.  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  The left iliac system had good flow without obvious stenosis. With his new symptomatology and the marked drop in the right leg, I think consideration for angiography with possible intervention for the likely right iliac stenosis would be prudent at this point.  I discussed the risks and benefits of the procedure.  I have discussed the pathophysiology and natural history of the arterial insufficiency.  The patient voices his understanding and is agreeable to proceed with angiography and possible intervention to the right leg.

## 2017-12-18 NOTE — Assessment & Plan Note (Signed)
lipid control important in reducing the progression of atherosclerotic disease. Continue statin therapy  

## 2017-12-18 NOTE — Patient Instructions (Signed)

## 2017-12-18 NOTE — Progress Notes (Addendum)
MRN : [id number]  Jerry [name] is a 72 y.o. (1945-10-20) male who presents with chief complaint of  Chief Complaint  Patient presents with  . Follow-up    ABI and Arterial for right leg pain  .  History of Present Illness: Patient returns today in follow up of his PAD.  He underwent kissing balloon stents and left external iliac artery stents as well as left femoral endarterectomy about 2-1/2 months ago.  This was done largely for disabling claudication symptoms of the left leg.  His left leg claudication symptoms are basically gone at this point.  However, his right leg has developed disabling claudication symptoms.  He does not have open ulceration or infection.  His incisions are well-healed.  His noninvasive studies show some mild improvement in his left ABI now up to 0.78 with good biphasic waveforms.  His right ABI has dropped to 0.44 with monophasic waveforms.  His right ABI was normal previously.  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  The left iliac system had good flow without obvious stenosis.  Current Outpatient Medications  Medication Sig Dispense Refill  . acetaminophen (TYLENOL) 325 MG tablet Take 650 mg by mouth every 6 (six) hours as needed for moderate pain.    Marland Kitchen aspirin EC 81 MG tablet Take 81 mg by mouth daily.    Marland Kitchen atorvastatin (LIPITOR) 10 MG tablet Take 1 tablet (10 mg total) by mouth daily. 30 tablet 11  . cholecalciferol (VITAMIN D) 1000 units tablet Take 1,000 Units by mouth daily.    . clopidogrel (PLAVIX) 75 MG tablet Take 1 tablet (75 mg total) by mouth daily. 30 tablet 5  . ibuprofen (ADVIL,MOTRIN) 600 MG tablet Take 1 tablet (600 mg total) by mouth every 8 (eight) hours as needed. 15 tablet 0  . Multiple Vitamin (MULTIVITAMIN) capsule Take 1 capsule by mouth as needed.     . nicotine (NICODERM CQ - DOSED IN MG/24 HOURS) 21 mg/24hr patch Place 1 patch (21 mg  total) onto the skin daily. 28 patch 1  . tamsulosin (FLOMAX) 0.4 MG CAPS capsule     . traMADol (ULTRAM) 50 MG tablet Take 1 tablet (50 mg total) by mouth every 12 (twelve) hours as needed. 12 tablet 0  . oxyCODONE-acetaminophen (PERCOCET/ROXICET) 5-325 MG tablet One to Two Tabs Every Six Hours As Needed For Pain (Patient not taking: Reported on 10/12/2017) 50 tablet 0   No current facility-administered medications for this visit.     Past Medical History:  Diagnosis Date  . Anemia    patient unaware of this diagnosis  . Cigarette smoker   . Hearing loss   . Hyperlipidemia   . Peripheral vascular disease (HCC)    blockages in both extremities  . Vitamin D deficiency    patient unaware of this diagnosis    Past Surgical History:  Procedure Laterality Date  . ANGIOPLASTY ILLIAC ARTERY Left 10/03/2017   Procedure: ANGIOPLASTY ILIAC ARTERY;  Surgeon: Algernon Huxley, MD;  Location: [facility];  Service: Vascular;  Laterality: Left;  . BACK SURGERY  1990   Three sx in lower back. metal in lower back  . COLONOSCOPY  01/2010  . ENDARTERECTOMY FEMORAL Left 10/03/2017   Procedure: ENDARTERECTOMY FEMORAL;  Surgeon: Algernon Huxley, MD;  Location: [facility];  Service: Vascular;  Laterality: Left;  . Left Leg stent Left 2009   fem fem bypass  .  LOWER EXTREMITY ANGIOGRAPHY Left 09/19/2017   Procedure: LOWER EXTREMITY ANGIOGRAPHY;  Surgeon: Algernon Huxley, MD;  Location: [facility];  Service: Cardiovascular;  Laterality: Left;  . PTCA Right 08/2017  . VASCULAR SURGERY Left 2009   fem fem bypass     Social History Social History   Tobacco Use  . Smoking status: Former Smoker    Packs/day: 1.00    Years: 40.00    Pack years: 40.00    Types: Cigarettes  . Smokeless tobacco: Never Used  Substance Use Topics  . Alcohol use: Never    Frequency: Never  . Drug use: Never    Family History Family History  Problem Relation Age of Onset  . Colon cancer Neg Hx   no bleeding or clotting  disorders  No Known Allergies   REVIEW OF SYSTEMS (Negative unless checked)  Constitutional: '[]' Weight loss  '[]' Fever  '[]' Chills Cardiac: '[]' Chest pain   '[]' Chest pressure   '[]' Palpitations   '[]' Shortness of breath when laying flat   '[]' Shortness of breath at rest   '[]' Shortness of breath with exertion. Vascular:  '[x]' Pain in legs with walking   '[]' Pain in legs at rest   '[]' Pain in legs when laying flat   '[x]' Claudication   '[]' Pain in feet when walking  '[]' Pain in feet at rest  '[]' Pain in feet when laying flat   '[]' History of DVT   '[]' Phlebitis   '[]' Swelling in legs   '[]' Varicose veins   '[]' Non-healing ulcers Pulmonary:   '[]' Uses home oxygen   '[]' Productive cough   '[]' Hemoptysis   '[]' Wheeze  '[]' COPD   '[]' Asthma Neurologic:  '[]' Dizziness  '[]' Blackouts   '[]' Seizures   '[]' History of stroke   '[]' History of TIA  '[]' Aphasia   '[]' Temporary blindness   '[]' Dysphagia   '[]' Weakness or numbness in arms   '[]' Weakness or numbness in legs Musculoskeletal:  '[x]' Arthritis   '[]' Joint swelling   '[x]' Joint pain   '[]' Low back pain Hematologic:  '[]' Easy bruising  '[]' Easy bleeding   '[]' Hypercoagulable state   '[]' Anemic  '[]' Hepatitis Gastrointestinal:  '[]' Blood in stool   '[]' Vomiting blood  '[]' Gastroesophageal reflux/heartburn   '[]' Abdominal pain Genitourinary:  '[]' Chronic kidney disease   '[]' Difficult urination  '[]' Frequent urination  '[]' Burning with urination   '[]' Hematuria Skin:  '[]' Rashes   '[]' Ulcers   '[]' Wounds Psychological:  '[]' History of anxiety   '[]'  History of major depression.  Physical Examination  BP 118/69 (BP Location: Right Arm, Patient Position: Sitting)   Pulse 67   Resp 13   Ht '5\' 4"'  (1.626 m)   Wt 134 lb (60.8 kg)   BMI 23.00 kg/m  Gen:  WD/WN, NAD Head: [place]/AT, No temporalis wasting. Ear/Nose/Throat: Hearing grossly intact, nares w/o erythema or drainage Eyes: Conjunctiva clear. Sclera non-icteric Neck: Supple.  Trachea midline Pulmonary:  Good air movement, no use of accessory muscles.  Cardiac: RRR, no JVD Vascular:  Vessel Right Left    Radial Palpable Palpable                          PT Not Palpable 2+ Palpable  DP 1+ Palpable Trace Palpable    Musculoskeletal: M/S 5/5 throughout.  No deformity or atrophy. Trace LE edema. Neurologic: Sensation grossly intact in extremities.  Symmetrical.  Speech is fluent.  Psychiatric: Judgment intact, Mood & affect appropriate for pt's clinical situation. Dermatologic: No rashes or ulcers noted.  No cellulitis or open wounds.       Labs Recent Results (from the past 2160 hour(s))  APTT     Status: None   Collection Time: 09/25/17 11:45 AM  Result Value Ref Range   aPTT 29 24 - 36 seconds    Comment: Performed at [facility], [address] [place], [place] [address]  Basic metabolic panel     Status: Abnormal   Collection Time: 09/25/17 11:45 AM  Result Value Ref Range   Sodium 141 135 - 145 mmol/L   Potassium 4.4 3.5 - 5.1 mmol/L   Chloride 106 98 - 111 mmol/L    Comment: Please note change in reference range.   CO2 27 22 - 32 mmol/L   Glucose, Bld 104 (H) 70 - 99 mg/dL    Comment: Please note change in reference range.   BUN 13 8 - 23 mg/dL    Comment: Please note change in reference range.   Creatinine, Ser 0.94 0.61 - 1.24 mg/dL   Calcium 9.4 8.9 - 10.3 mg/dL   GFR calc non Af Amer >60 >60 mL/min   GFR calc Af Amer >60 >60 mL/min    Comment: (NOTE) The eGFR has been calculated using the CKD EPI equation. This calculation has not been validated in all clinical situations. eGFR's persistently <60 mL/min signify possible Chronic Kidney Disease.    Anion gap 8 5 - 15    Comment: Performed at [facility], [address] [place], [place] [address]  CBC WITH DIFFERENTIAL     Status: None   Collection Time: 09/25/17 11:45 AM  Result Value Ref Range   WBC 7.0 3.8 - 10.6 K/uL   RBC 4.61 4.40 - 5.90 MIL/uL   Hemoglobin 15.1 13.0 - 18.0 g/dL   HCT 43.7 40.0 - 52.0 %   MCV 94.9 80.0 - 100.0 fL   MCH 32.7 26.0 - 34.0 pg   MCHC 34.5  32.0 - 36.0 g/dL   RDW 14.3 11.5 - 14.5 %   Platelets 207 150 - 440 K/uL   Neutrophils Relative % 57 %   Neutro Abs 4.1 1.4 - 6.5 K/uL   Lymphocytes Relative 25 %   Lymphs Abs 1.8 1.0 - 3.6 K/uL   Monocytes Relative 9 %   Monocytes Absolute 0.6 0.2 - 1.0 K/uL   Eosinophils Relative 8 %   Eosinophils Absolute 0.5 0 - 0.7 K/uL   Basophils Relative 1 %   Basophils Absolute 0.0 0 - 0.1 K/uL    Comment: Performed at [facility], [address] [place], [place] [address]  Protime-INR     Status: None   Collection Time: 09/25/17 11:45 AM  Result Value Ref Range   Prothrombin Time 12.0 11.4 - 15.2 seconds   INR 0.89     Comment: Performed at [facility], [address], [place], [place] [address]  Type and screen     Status: None   Collection Time: 09/25/17 11:45 AM  Result Value Ref Range   ABO/RH(D) O POS    Antibody Screen NEG    Sample Expiration 10/09/2017    Extend sample reason      NO TRANSFUSIONS OR PREGNANCY IN THE PAST 3 MONTHS Performed at [facility], [address] [place], [place] [address]   Surgical pcr screen     Status: None   Collection Time: 09/25/17 11:45 AM  Result Value Ref Range   MRSA, PCR NEGATIVE NEGATIVE   Staphylococcus aureus NEGATIVE NEGATIVE    Comment: (NOTE) The Xpert SA Assay (FDA approved for NASAL specimens in patients 22  years of age and older), is one component of a comprehensive surveillance program. It is not intended to diagnose infection nor to guide or monitor treatment. Performed at [facility], [address] [place], [place] [address]   ABO/Rh     Status: None   Collection Time: 10/03/17  9:29 AM  Result Value Ref Range   ABO/RH(D)      O POS Performed at [facility], [address] [place], [place] [address]   Surgical pathology     Status: None   Collection Time: 10/03/17  2:02 PM  Result Value Ref Range   SURGICAL PATHOLOGY      Surgical Pathology CASE:  [id number] PATIENT: Colan Neptune Surgical Pathology Report     SPECIMEN SUBMITTED: A. Plaque, left common femoral, thrombus, sfa, profunda, old graft  CLINICAL HISTORY: None provided  PRE-OPERATIVE DIAGNOSIS: ASO with claudication  POST-OPERATIVE DIAGNOSIS: Same as pre-op     DIAGNOSIS: A.  PLAQUE AND THROMBUS, LEFT COMMON FEMORAL, SFA PROFUNDA OLD GRAFT; ENDARTERECTOMY: - THROMBUS, ATHEROSCLEROTIC PLAQUE AND GRAFT MATERIAL.   GROSS DESCRIPTION: A. Labeled: Left common femoral plaque and thrombus, SFA profunda, old graft Received: In formalin Tissue fragment(s): Multiple Size: Aggregate, 4.2 x 3.5 x 1.1 cm Description: Tan to brown focally calcified fragments of tissue  Representative submitted in one cassette.  Tissue decalcification: Yes    Final Diagnosis performed by Delorse Lek, MD.   Electronically signed 10/05/2017 12:43:30PM The electronic signature indicates that the named Attending Pathologist has evaluated  the specimen  Technical component performed at [facility], [address] [place], [place] [address] Lab: [phone] Dir: Rush Farmer, MD, MMM  Professional component performed at [facility], [facility], [address], [place], [place] [address] Lab: [phone] Dir: Dellia Nims. Rubinas, MD   Glucose, capillary     Status: Abnormal   Collection Time: 10/03/17  8:06 PM  Result Value Ref Range   Glucose-Capillary 151 (H) 70 - 99 mg/dL  CBC     Status: Abnormal   Collection Time: 10/03/17  8:31 PM  Result Value Ref Range   WBC 10.5 3.8 - 10.6 K/uL   RBC 4.13 (L) 4.40 - 5.90 MIL/uL   Hemoglobin 13.3 13.0 - 18.0 g/dL   HCT 39.5 (L) 40.0 - 52.0 %   MCV 95.8 80.0 - 100.0 fL   MCH 32.3 26.0 - 34.0 pg   MCHC 33.8 32.0 - 36.0 g/dL   RDW 14.3 11.5 - 14.5 %   Platelets 211 150 - 440 K/uL    Comment: Performed at [facility], [address] [place], [place] [address]  Creatinine, serum     Status: None   Collection  Time: 10/03/17  8:31 PM  Result Value Ref Range   Creatinine, Ser 1.07 0.61 - 1.24 mg/dL   GFR calc non Af Amer >60 >60 mL/min   GFR calc Af Amer >60 >60 mL/min    Comment: (NOTE) The eGFR has been calculated using the CKD EPI equation. This calculation has not been validated in all clinical situations. eGFR's persistently <60 mL/min signify possible Chronic Kidney Disease. Performed at [facility], [address] [place], [place] [address]   CBC     Status: Abnormal   Collection Time: 10/04/17  5:51 AM  Result Value Ref Range   WBC 11.9 (H) 3.8 - 10.6 K/uL   RBC 3.91 (L) 4.40 - 5.90 MIL/uL   Hemoglobin 12.9 (L) 13.0 - 18.0 g/dL   HCT 37.3 (L) 40.0 -  52.0 %   MCV 95.4 80.0 - 100.0 fL   MCH 33.0 26.0 - 34.0 pg   MCHC 34.6 32.0 - 36.0 g/dL   RDW 14.1 11.5 - 14.5 %   Platelets 215 150 - 440 K/uL    Comment: Performed at [facility], [address] [place], [place] [address]  Basic metabolic panel     Status: Abnormal   Collection Time: 10/04/17  5:51 AM  Result Value Ref Range   Sodium 138 135 - 145 mmol/L   Potassium 4.1 3.5 - 5.1 mmol/L   Chloride 105 98 - 111 mmol/L    Comment: Please note change in reference range.   CO2 24 22 - 32 mmol/L   Glucose, Bld 152 (H) 70 - 99 mg/dL    Comment: Please note change in reference range.   BUN 12 8 - 23 mg/dL    Comment: Please note change in reference range.   Creatinine, Ser 1.17 0.61 - 1.24 mg/dL   Calcium 8.7 (L) 8.9 - 10.3 mg/dL   GFR calc non Af Amer >60 >60 mL/min   GFR calc Af Amer >60 >60 mL/min    Comment: (NOTE) The eGFR has been calculated using the CKD EPI equation. This calculation has not been validated in all clinical situations. eGFR's persistently <60 mL/min signify possible Chronic Kidney Disease.    Anion gap 9 5 - 15    Comment: Performed at [facility], [address] [place], [place] [address]  CBC     Status: Abnormal   Collection Time: 10/05/17  5:21 AM  Result Value  Ref Range   WBC 9.2 3.8 - 10.6 K/uL   RBC 3.43 (L) 4.40 - 5.90 MIL/uL   Hemoglobin 11.5 (L) 13.0 - 18.0 g/dL   HCT 32.5 (L) 40.0 - 52.0 %   MCV 94.9 80.0 - 100.0 fL   MCH 33.5 26.0 - 34.0 pg   MCHC 35.3 32.0 - 36.0 g/dL   RDW 14.4 11.5 - 14.5 %   Platelets 184 150 - 440 K/uL    Comment: Performed at [facility], [address] [place], [place] [address]  Basic metabolic panel     Status: None   Collection Time: 10/05/17  5:21 AM  Result Value Ref Range   Sodium 141 135 - 145 mmol/L   Potassium 3.7 3.5 - 5.1 mmol/L   Chloride 107 98 - 111 mmol/L    Comment: Please note change in reference range.   CO2 26 22 - 32 mmol/L   Glucose, Bld 98 70 - 99 mg/dL    Comment: Please note change in reference range.   BUN 15 8 - 23 mg/dL    Comment: Please note change in reference range.   Creatinine, Ser 1.05 0.61 - 1.24 mg/dL   Calcium 8.9 8.9 - 10.3 mg/dL   GFR calc non Af Amer >60 >60 mL/min   GFR calc Af Amer >60 >60 mL/min    Comment: (NOTE) The eGFR has been calculated using the CKD EPI equation. This calculation has not been validated in all clinical situations. eGFR's persistently <60 mL/min signify possible Chronic Kidney Disease.    Anion gap 8 5 - 15    Comment: Performed at [facility], [address] [place], [place] [address]  Magnesium     Status: None   Collection Time: 10/05/17  5:21 AM  Result Value Ref Range   Magnesium 1.9 1.7 - 2.4 mg/dL    Comment:  Performed at [facility], [address] [place], [place] [address]    Radiology No results found.  Assessment/Plan  Hyperlipidemia lipid control important in reducing the progression of atherosclerotic disease. Continue statin therapy   Atherosclerosis with claudication of extremity ([place]) His noninvasive studies show some mild improvement in his left ABI now up to 0.78 with good biphasic waveforms.  His right ABI has dropped to 0.44 with monophasic waveforms.  His right ABI was  normal previously.  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  The left iliac system had good flow without obvious stenosis. With his new symptomatology and the marked drop in the right leg, I think consideration for angiography with possible intervention for the likely right iliac stenosis would be prudent at this point.  I discussed the risks and benefits of the procedure.  I have discussed the pathophysiology and natural history of the arterial insufficiency.  The patient voices his understanding and is agreeable to proceed with angiography and possible intervention to the right leg.  Incontinence of urine I do not know why the patient is having incontinence issues.  I will put in a referral to Urology.    [name], MD  12/21/2017 4:18 PM    This note was created with Dragon medical transcription system.  Any errors from dictation are purely unintentional

## 2017-12-21 ENCOUNTER — Encounter (INDEPENDENT_AMBULATORY_CARE_PROVIDER_SITE_OTHER): Payer: Self-pay

## 2017-12-21 DIAGNOSIS — R32 Unspecified urinary incontinence: Secondary | ICD-10-CM | POA: Insufficient documentation

## 2017-12-21 NOTE — Addendum Note (Signed)
Addended by: Algernon Huxley on: 12/21/2017 04:20 PM   Modules accepted: Orders

## 2017-12-21 NOTE — Assessment & Plan Note (Signed)
I do not know why the patient is having incontinence issues.  I will put in a referral to Urology.

## 2017-12-26 ENCOUNTER — Ambulatory Visit (INDEPENDENT_AMBULATORY_CARE_PROVIDER_SITE_OTHER): Payer: Medicare HMO | Admitting: Urology

## 2017-12-26 ENCOUNTER — Encounter: Payer: Self-pay | Admitting: Urology

## 2017-12-26 VITALS — BP 125/60 | HR 82 | Ht 65.0 in | Wt 130.6 lb

## 2017-12-26 DIAGNOSIS — N402 Nodular prostate without lower urinary tract symptoms: Secondary | ICD-10-CM

## 2017-12-26 DIAGNOSIS — R32 Unspecified urinary incontinence: Secondary | ICD-10-CM

## 2017-12-26 DIAGNOSIS — N3941 Urge incontinence: Secondary | ICD-10-CM | POA: Diagnosis not present

## 2017-12-26 LAB — BLADDER SCAN AMB NON-IMAGING: Scan Result: 0

## 2017-12-26 MED ORDER — MIRABEGRON ER 25 MG PO TB24: 25.0000 mg | ORAL_TABLET | Freq: Every day | ORAL | 0 refills | Status: DC

## 2017-12-26 NOTE — Progress Notes (Signed)
12/26/2017 11:38 AM   Jerry [name] [birth year] [id number]  Referring provider: Marguerita Merles, [place], [place] [address]  Chief Complaint  Patient presents with  . Urinary Incontinence    HPI: Patient is a 72 -year-old [place] American male male who is referred to [place] by Dr. [name] for urinary incontinence.  He underwent kissing balloon stents and left external iliac artery stents as well as left femoral endarterectomy on 10/03/2017.    He states he has incontinence with urge.  He is wearing two depends daily.   He was given tamsulosin 0.4 mg by his PCP, but it has not provided relief.   Has recently started tolterodine.    He is having associated urinary frequency, urgency, nocturia and intermittency.  Patient denies any gross hematuria, dysuria or suprapubic/flank pain.  Patient denies any fevers, chills, nausea or vomiting.   He does not have a history of urinary tract infections, STI's or injury to the bladder.   He does not have a history of nephrolithiasis, GU surgery or GU trauma.   She denies constipation and/or diarrhea.   He is drinking 2 bottles of water daily.   He is drinking two cup of coffee in the morning.  He is drinking 2 to 3 Pepsi's daily.  He drinks apple juice.  He drinks sweat tea during the weekend.   He is not drinking alcohol.    He is a smoker.  His PVR is 0 mL.    PMH: Past Medical History:  Diagnosis Date  . Anemia    patient unaware of this diagnosis  . Cigarette smoker   . Hearing loss   . Hyperlipidemia   . Leg DVT (deep venous thromboembolism), acute, left ([place])   . Peripheral vascular disease (HCC)    blockages in both extremities  . Vitamin D deficiency    patient unaware of this diagnosis    Surgical History: Past Surgical History:  Procedure Laterality Date  . ANGIOPLASTY ILLIAC ARTERY Left 10/03/2017   Procedure: ANGIOPLASTY ILIAC ARTERY;  Surgeon: Algernon Huxley, MD;  Location: [facility] ORS;  Service:  Vascular;  Laterality: Left;  . BACK SURGERY  1990   Three sx in lower back. metal in lower back  . COLONOSCOPY  01/2010  . ENDARTERECTOMY FEMORAL Left 10/03/2017   Procedure: ENDARTERECTOMY FEMORAL;  Surgeon: Algernon Huxley, MD;  Location: [facility] ORS;  Service: Vascular;  Laterality: Left;  . Left Leg stent Left 2009   fem fem bypass  . LOWER EXTREMITY ANGIOGRAPHY Left 09/19/2017   Procedure: LOWER EXTREMITY ANGIOGRAPHY;  Surgeon: Algernon Huxley, MD;  Location: [facility];  Service: Cardiovascular;  Laterality: Left;  . PTCA Right 08/2017  . VASCULAR SURGERY Left 2009   fem fem bypass     Home Medications:  Allergies as of 12/26/2017   No Known Allergies     Medication List        Accurate as of 12/26/17 11:38 AM. Always use your most recent med list.          acetaminophen 325 MG tablet Commonly known as:  TYLENOL Take 650 mg by mouth every 6 (six) hours as needed for moderate pain.   aspirin EC 81 MG tablet Take 81 mg by mouth daily.   cholecalciferol 1000 units tablet Commonly known as:  VITAMIN D Take 1,000 Units by mouth daily.   clopidogrel 75 MG tablet Commonly known as:  PLAVIX Take 1 tablet (75  mg total) by mouth daily.   ibuprofen 600 MG tablet Commonly known as:  ADVIL,MOTRIN Take 1 tablet (600 mg total) by mouth every 8 (eight) hours as needed.   mirabegron ER 25 MG Tb24 tablet Commonly known as:  MYRBETRIQ Take 1 tablet (25 mg total) by mouth daily.   multivitamin capsule Take 1 capsule by mouth as needed.   nicotine 21 mg/24hr patch Commonly known as:  NICODERM CQ - dosed in mg/24 hours Place 1 patch (21 mg total) onto the skin daily.       Allergies: No Known Allergies  Family History: Family History  Problem Relation Age of Onset  . Colon cancer Neg Hx     Social History:  reports that he has been smoking cigarettes. He has a 120.00 pack-year smoking history. He has never used smokeless tobacco. He reports that he does not drink  alcohol or use drugs.  ROS: UROLOGY Frequent Urination?: Yes Hard to postpone urination?: Yes Burning/pain with urination?: No Get up at night to urinate?: Yes Leakage of urine?: Yes Urine stream starts and stops?: Yes Trouble starting stream?: No Do you have to strain to urinate?: No Blood in urine?: No Urinary tract infection?: No Sexually transmitted disease?: No Injury to kidneys or bladder?: No Painful intercourse?: No Weak stream?: No Erection problems?: No Penile pain?: No  Gastrointestinal Nausea?: No Vomiting?: No Indigestion/heartburn?: No Diarrhea?: No Constipation?: No  Constitutional Fever: No Night sweats?: No Weight loss?: No Fatigue?: No  Skin Skin rash/lesions?: No Itching?: No  Eyes Blurred vision?: No Double vision?: No  Ears/Nose/Throat Sore throat?: No Sinus problems?: No  Hematologic/Lymphatic Swollen glands?: No Easy bruising?: No  Cardiovascular Leg swelling?: Yes Chest pain?: No  Respiratory Cough?: No Shortness of breath?: No  Endocrine Excessive thirst?: No  Musculoskeletal Back pain?: Yes Joint pain?: No  Neurological Headaches?: No Dizziness?: No  Psychologic Depression?: No Anxiety?: No  Physical Exam: BP 125/60 (BP Location: Left Arm, Patient Position: Sitting, Cuff Size: Normal)   Pulse 82   Ht 5\' 5"  (1.651 m)   Wt 130 lb 9.6 oz (59.2 kg)   BMI 21.73 kg/m   Constitutional: Well nourished. Alert and oriented, No acute distress. HEENT: [place] AT, moist mucus membranes. Trachea midline, no masses. Cardiovascular: No clubbing, cyanosis, or edema. Respiratory: Normal respiratory effort, no increased work of breathing. GI: Abdomen is soft, non tender, non distended, no abdominal masses. Liver and spleen not palpable.  No hernias appreciated.  Stool sample for occult testing is not indicated.   GU: No CVA tenderness.  No bladder fullness or masses. Patient with uncircumcised phallus. Foreskin easily retracted   Urethral meatus is patent.  No penile discharge. No penile lesions or rashes. Scrotum without lesions, cysts, rashes and/or edema.  Testicles are located scrotally bilaterally. No masses are appreciated in the testicles. Left and right epididymis are normal. Rectal: Patient with  normal sphincter tone. Anus and perineum without scarring or rashes. No rectal masses are appreciated. Prostate is approximately 60 + grams, could only palpate the apex and midportion of the gland, linear nodule in the left lobe.  Hard stool is in the rectal vault.   Skin: No rashes, bruises or suspicious lesions. Lymph: No cervical or inguinal adenopathy. Neurologic: Grossly intact, no focal deficits, moving all 4 extremities. Psychiatric: Normal mood and affect.  Laboratory Data: Lab Results  Component Value Date   WBC 9.2 10/05/2017   HGB 11.5 (L) 10/05/2017   HCT 32.5 (L) 10/05/2017   MCV 94.9  10/05/2017   PLT 184 10/05/2017    Lab Results  Component Value Date   CREATININE 1.05 10/05/2017    No results found for: PSA  No results found for: TESTOSTERONE  No results found for: HGBA1C  No results found for: TSH  No results found for: CHOL, HDL, CHOLHDL, VLDL, LDLCALC  Lab Results  Component Value Date   AST 27 07/18/2011   Lab Results  Component Value Date   ALT 19 07/18/2011   No components found for: ALKALINEPHOPHATASE No components found for: BILIRUBINTOTAL  No results found for: ESTRADIOL  Urinalysis    Component Value Date/Time   COLORURINE Amber 07/18/2011 1143   APPEARANCEUR Hazy 07/18/2011 1143   LABSPEC 1.029 07/18/2011 1143   PHURINE 5.0 07/18/2011 1143   GLUCOSEU Negative 07/18/2011 1143   HGBUR 1+ 07/18/2011 1143   BILIRUBINUR Negative 07/18/2011 1143   KETONESUR Negative 07/18/2011 1143   PROTEINUR 30 mg/dL 07/18/2011 1143   NITRITE Negative 07/18/2011 1143   LEUKOCYTESUR Negative 07/18/2011 1143    I have reviewed the labs.   Pertinent Imaging: Results for  SENAI, KINGSLEY (MRN [id number]) as of 12/26/2017 11:31  Ref. Range 12/26/2017 11:06  Scan Result Unknown 0     Assessment & Plan:    1. Urge Incontinence Discussed behavioral therapies, bladder training and bladder control strategies Encouraged patient to decrease his Pepsi consumption  Offered medical therapy beta-3 adrenergic receptor agonist and the potential side effect - Given Myrbetriq 25 mg samples, #28.  I have reviewed with the patient of the side effects of Myrbetriq, such as: elevation in BP, urinary retention and/or HA.   Will discontinue the tamsulosin and the tolterodine RTC in 3 weeks for PVR and I PSS Bladder Scan (Post Void Residual) in office  2. Prostate nodule PSA drawn today (3.7)   Recommend a MRI of prostate as a prostate biopsy at this time would be risky due to his anticoagulation status, he will not be able to come off his anticoagulation medication without great medical risk at this time  - the MRI will help to determine the urgency in which to address the nodule    Return in about 3 weeks (around 01/16/2018) for IPSS and PVR.  These notes generated with voice recognition software. I apologize for typographical errors.  Zara Council, PA-C  [facility] [address] [place], [place] [address] [phone]

## 2017-12-26 NOTE — Patient Instructions (Signed)
Stop the tamsulosin (Flomax) and the tolterodine (Detrol)

## 2017-12-27 LAB — PSA: Prostate Specific Ag, Serum: 3.7 ng/mL (ref 0.0–4.0)

## 2017-12-28 ENCOUNTER — Other Ambulatory Visit (INDEPENDENT_AMBULATORY_CARE_PROVIDER_SITE_OTHER): Payer: Self-pay | Admitting: Nurse Practitioner

## 2018-01-04 ENCOUNTER — Other Ambulatory Visit: Payer: Self-pay

## 2018-01-04 ENCOUNTER — Encounter
Admission: RE | Admit: 2018-01-04 | Discharge: 2018-01-04 | Disposition: A | Discharge status: Home / Self Care | Payer: Medicare HMO | Source: Ambulatory Visit | Attending: Vascular Surgery | Admitting: Vascular Surgery

## 2018-01-04 DIAGNOSIS — Z01812 Encounter for preprocedural laboratory examination: Secondary | ICD-10-CM | POA: Diagnosis present

## 2018-01-04 LAB — CREATININE, SERUM
CREATININE: 1.05 mg/dL (ref 0.61–1.24)
GFR calc Af Amer: >60 mL/min (ref >60)

## 2018-01-04 LAB — BUN: BUN: 15 mg/dL (ref 8–23)

## 2018-01-06 MED ORDER — DEXTROSE 5 % IV SOLN
2.0000 g | Freq: Once | INTRAVENOUS | Status: AC
  Administered 2018-01-07: 2 g via INTRAVENOUS
  Filled 2018-01-06: qty 20

## 2018-01-07 ENCOUNTER — Encounter: Payer: Self-pay | Admitting: *Deleted

## 2018-01-07 ENCOUNTER — Encounter
Admission: RE | Disposition: A | Discharge status: Home / Self Care | Payer: Self-pay | Source: Ambulatory Visit | Attending: Vascular Surgery

## 2018-01-07 ENCOUNTER — Ambulatory Visit
Admission: RE | Admit: 2018-01-07 | Discharge: 2018-01-07 | Disposition: A | Discharge status: Home / Self Care | Payer: Medicare HMO | Source: Ambulatory Visit | Attending: Vascular Surgery | Admitting: Vascular Surgery

## 2018-01-07 DIAGNOSIS — Z9889 Other specified postprocedural states: Secondary | ICD-10-CM | POA: Diagnosis not present

## 2018-01-07 DIAGNOSIS — H9190 Unspecified hearing loss, unspecified ear: Secondary | ICD-10-CM | POA: Diagnosis not present

## 2018-01-07 DIAGNOSIS — E785 Hyperlipidemia, unspecified: Secondary | ICD-10-CM | POA: Diagnosis not present

## 2018-01-07 DIAGNOSIS — Z955 Presence of coronary angioplasty implant and graft: Secondary | ICD-10-CM | POA: Insufficient documentation

## 2018-01-07 DIAGNOSIS — Z87891 Personal history of nicotine dependence: Secondary | ICD-10-CM | POA: Diagnosis not present

## 2018-01-07 DIAGNOSIS — Z79899 Other long term (current) drug therapy: Secondary | ICD-10-CM | POA: Diagnosis not present

## 2018-01-07 DIAGNOSIS — Z7902 Long term (current) use of antithrombotics/antiplatelets: Secondary | ICD-10-CM | POA: Diagnosis not present

## 2018-01-07 DIAGNOSIS — I70219 Atherosclerosis of native arteries of extremities with intermittent claudication, unspecified extremity: Secondary | ICD-10-CM

## 2018-01-07 DIAGNOSIS — I7092 Chronic total occlusion of artery of the extremities: Secondary | ICD-10-CM | POA: Diagnosis not present

## 2018-01-07 DIAGNOSIS — I70213 Atherosclerosis of native arteries of extremities with intermittent claudication, bilateral legs: Secondary | ICD-10-CM | POA: Insufficient documentation

## 2018-01-07 DIAGNOSIS — E559 Vitamin D deficiency, unspecified: Secondary | ICD-10-CM | POA: Insufficient documentation

## 2018-01-07 DIAGNOSIS — Z7982 Long term (current) use of aspirin: Secondary | ICD-10-CM | POA: Insufficient documentation

## 2018-01-07 DIAGNOSIS — I70211 Atherosclerosis of native arteries of extremities with intermittent claudication, right leg: Secondary | ICD-10-CM | POA: Diagnosis not present

## 2018-01-07 DIAGNOSIS — R32 Unspecified urinary incontinence: Secondary | ICD-10-CM | POA: Diagnosis not present

## 2018-01-07 HISTORY — PX: LOWER EXTREMITY ANGIOGRAPHY: CATH118251

## 2018-01-07 SURGERY — LOWER EXTREMITY ANGIOGRAPHY
Anesthesia: Moderate Sedation | Laterality: Right

## 2018-01-07 MED ORDER — SODIUM CHLORIDE 0.9 % IV SOLN: 250.0000 mL | INTRAVENOUS | Status: DC | PRN

## 2018-01-07 MED ORDER — HEPARIN SODIUM (PORCINE) 1000 UNIT/ML IJ SOLN
INTRAMUSCULAR | Status: AC
  Filled 2018-01-07: qty 1

## 2018-01-07 MED ORDER — FENTANYL CITRATE (PF) 100 MCG/2ML IJ SOLN
INTRAMUSCULAR | Status: AC
  Filled 2018-01-07: qty 4

## 2018-01-07 MED ORDER — SODIUM CHLORIDE 0.9% FLUSH: 3.0000 mL | Freq: Two times a day (BID) | INTRAVENOUS | Status: DC

## 2018-01-07 MED ORDER — FENTANYL CITRATE (PF) 100 MCG/2ML IJ SOLN
INTRAMUSCULAR | Status: DC | PRN
  Administered 2018-01-07: 12.5 ug via INTRAVENOUS
  Administered 2018-01-07: 25 ug via INTRAVENOUS
  Administered 2018-01-07: 12.5 ug via INTRAVENOUS
  Administered 2018-01-07: 25 ug via INTRAVENOUS
  Administered 2018-01-07: 50 ug via INTRAVENOUS

## 2018-01-07 MED ORDER — CEFAZOLIN SODIUM-DEXTROSE 2-4 GM/100ML-% IV SOLN
INTRAVENOUS | Status: AC
  Filled 2018-01-07: qty 100

## 2018-01-07 MED ORDER — SODIUM CHLORIDE 0.9 % IV SOLN
INTRAVENOUS | Status: DC
  Administered 2018-01-07: 10:00:00 via INTRAVENOUS

## 2018-01-07 MED ORDER — SODIUM CHLORIDE 0.9% FLUSH: 3.0000 mL | INTRAVENOUS | Status: DC | PRN

## 2018-01-07 MED ORDER — ONDANSETRON HCL 4 MG/2ML IJ SOLN: 4.0000 mg | Freq: Four times a day (QID) | INTRAMUSCULAR | Status: DC | PRN

## 2018-01-07 MED ORDER — HEPARIN SODIUM (PORCINE) 1000 UNIT/ML IJ SOLN
INTRAMUSCULAR | Status: DC | PRN
  Administered 2018-01-07: 4000 [IU] via INTRAVENOUS

## 2018-01-07 MED ORDER — MIDAZOLAM HCL 2 MG/2ML IJ SOLN
INTRAMUSCULAR | Status: DC | PRN
  Administered 2018-01-07: 2 mg via INTRAVENOUS
  Administered 2018-01-07: 1 mg via INTRAVENOUS
  Administered 2018-01-07: 0.5 mg via INTRAVENOUS
  Administered 2018-01-07: 1 mg via INTRAVENOUS
  Administered 2018-01-07: 0.5 mg via INTRAVENOUS

## 2018-01-07 MED ORDER — HYDROMORPHONE HCL 1 MG/ML IJ SOLN: 1.0000 mg | Freq: Once | INTRAMUSCULAR | Status: DC | PRN

## 2018-01-07 MED ORDER — HYDRALAZINE HCL 20 MG/ML IJ SOLN: 5.0000 mg | INTRAMUSCULAR | Status: DC | PRN

## 2018-01-07 MED ORDER — LIDOCAINE-EPINEPHRINE (PF) 1 %-1:200000 IJ SOLN
INTRAMUSCULAR | Status: AC
  Filled 2018-01-07: qty 30

## 2018-01-07 MED ORDER — ATORVASTATIN CALCIUM 10 MG PO TABS: 10.0000 mg | ORAL_TABLET | Freq: Every day | ORAL | 11 refills | Status: DC

## 2018-01-07 MED ORDER — ACETAMINOPHEN 325 MG PO TABS: 650.0000 mg | ORAL_TABLET | ORAL | Status: DC | PRN

## 2018-01-07 MED ORDER — SODIUM CHLORIDE 0.9 % IV SOLN: INTRAVENOUS | Status: DC

## 2018-01-07 MED ORDER — MIDAZOLAM HCL 5 MG/5ML IJ SOLN
INTRAMUSCULAR | Status: AC
  Filled 2018-01-07: qty 5

## 2018-01-07 MED ORDER — LABETALOL HCL 5 MG/ML IV SOLN: 10.0000 mg | INTRAVENOUS | Status: DC | PRN

## 2018-01-07 MED ORDER — ATORVASTATIN CALCIUM 10 MG PO TABS
10.0000 mg | ORAL_TABLET | Freq: Every day | ORAL | Status: DC
  Filled 2018-01-07: qty 1

## 2018-01-07 MED ORDER — HEPARIN (PORCINE) IN NACL 1000-0.9 UT/500ML-% IV SOLN
INTRAVENOUS | Status: AC
  Filled 2018-01-07: qty 1000

## 2018-01-07 SURGICAL SUPPLY — 23 items
BALLN LUTONIX AV 7X60X75 (BALLOONS) ×2
BALLN ULTRVRSE 7X40X75C (BALLOONS) ×4
BALLOON LUTONIX AV 7X60X75 (BALLOONS) IMPLANT
BALLOON ULTRVRSE 7X40X75C (BALLOONS) IMPLANT
CANNULA 5F STIFF (CANNULA) ×1 IMPLANT
CATH BEACON 5 .035 40 KMP TP (CATHETERS) IMPLANT
CATH BEACON 5 .038 40 KMP TP (CATHETERS) ×1
CATH PIG 70CM (CATHETERS) ×1 IMPLANT
CATH VS15FR (CATHETERS) ×1 IMPLANT
DEVICE PRESTO INFLATION (MISCELLANEOUS) ×2 IMPLANT
DEVICE STARCLOSE SE CLOSURE (Vascular Products) ×2 IMPLANT
DEVICE TORQUE .025-.038 (MISCELLANEOUS) ×1 IMPLANT
GLIDEWIRE ADV .035X180CM (WIRE) ×1 IMPLANT
GLIDEWIRE STIFF .35X180X3 HYDR (WIRE) ×1 IMPLANT
GUIDEWIRE SUPER STIFF .035X180 (WIRE) ×1 IMPLANT
PACK ANGIOGRAPHY (CUSTOM PROCEDURE TRAY) ×2 IMPLANT
SHEATH BRITE TIP 5FRX11 (SHEATH) ×2 IMPLANT
SHEATH BRITE TIP 7FRX11 (SHEATH) ×2 IMPLANT
STENT LIFESTAR 8X80X80 (Permanent Stent) ×1 IMPLANT
STENT LIFESTREAM 9X58X80 (Permanent Stent) ×2 IMPLANT
SYR MEDRAD MARK V 150ML (SYRINGE) ×1 IMPLANT
TUBING CONTRAST HIGH PRESS 72 (TUBING) ×1 IMPLANT
WIRE MAGIC TOR.035 180C (WIRE) ×1 IMPLANT

## 2018-01-07 NOTE — H&P (Signed)
[facility] History & Physical Update  The patient was interviewed and re-examined.  The patient's previous History and Physical has been reviewed and is unchanged.  There is no change in the plan of care. We plan to proceed with the scheduled procedure.  [name], MD  01/07/2018, 11:03 AM

## 2018-01-07 NOTE — Discharge Instructions (Signed)
Groin Insertion Instructions-If you lose feeling or develop tingling or pain in your leg or foot after the procedure, please walk around first.  If the discomfort does not improve , contact your physician and proceed to the nearest emergency room.  Loss of feeling in your leg might mean that a blockage has formed in the artery and this can be appropriately treated.  Limit your activity for the next two days after your procedure.  Avoid stooping, bending, heavy lifting or exertion as this may put pressure on the insertion site.  Resume normal activities in 48 hours.  You may shower after 24 hours but avoid excessive warm water and do not scrub the site.  Remove clear dressing in 48 hours.  If you have had a closure device inserted, do not soak in a tub bath or a hot tub for at least one week. ° °No driving for 48 hours after discharge.  After the procedure, check the insertion site occasionally.  If any oozing occurs or there is apparent swelling, firm pressure over the site will prevent a bruise from forming.  You can not hurt anything by pressing directly on the site.  The pressure stops the bleeding by allowing a small clot to form.  If the bleeding continues after the pressure has been applied for more than 15 minutes, call 911 or go to the nearest emergency room.   ° °The x-ray dye causes you to pass a considerate amount of urine.  For this reason, you will be asked to drink plenty of liquids after the procedure to prevent dehydration.  You may resume you regular diet.  Avoid caffeine products.   ° °For pain at the site of your procedure, take non-aspirin medicines such as Tylenol. ° °Medications: A. Hold Metformin for 48 hours if applicable.  B. Continue taking all your present medications at home unless your doctor prescribes any changes.Angiogram, Care After °This sheet gives you information about how to care for yourself after your procedure. Your health care provider may also give you more specific  instructions. If you have problems or questions, contact your health care provider. °What can I expect after the procedure? °After the procedure, it is common to have bruising and tenderness at the catheter insertion area. °Follow these instructions at home: °Insertion site care °· Follow instructions from your health care provider about how to take care of your insertion site. Make sure you: °? Wash your hands with soap and water before you change your bandage (dressing). If soap and water are not available, use hand sanitizer. °? Change your dressing as told by your health care provider. °? Leave stitches (sutures), skin glue, or adhesive strips in place. These skin closures may need to stay in place for 2 weeks or longer. If adhesive strip edges start to loosen and curl up, you may trim the loose edges. Do not remove adhesive strips completely unless your health care provider tells you to do that. °· Do not take baths, swim, or use a hot tub until your health care provider approves. °· You may shower 24-48 hours after the procedure or as told by your health care provider. °? Gently wash the site with plain soap and water. °? Pat the area dry with a clean towel. °? Do not rub the site. This may cause bleeding. °· Do not apply powder or lotion to the site. Keep the site clean and dry. °· Check your insertion site every day for signs of infection. Check for: °?   Redness, swelling, or pain. °? Fluid or blood. °? Warmth. °? Pus or a bad smell. °Activity °· Rest as told by your health care provider, usually for 1-2 days. °· Do not lift anything that is heavier than 10 lbs. (4.5 kg) or as told by your health care provider. °· Do not drive for 24 hours if you were given a medicine to help you relax (sedative). °· Do not drive or use heavy machinery while taking prescription pain medicine. °General instructions °· Return to your normal activities as told by your health care provider, usually in about a week. Ask your  health care provider what activities are safe for you. °· If the catheter site starts bleeding, lie flat and put pressure on the site. If the bleeding does not stop, get help right away. This is a medical emergency. °· Drink enough fluid to keep your urine clear or pale yellow. This helps flush the contrast dye from your body. °· Take over-the-counter and prescription medicines only as told by your health care provider. °· Keep all follow-up visits as told by your health care provider. This is important. °Contact a health care provider if: °· You have a fever or chills. °· You have redness, swelling, or pain around your insertion site. °· You have fluid or blood coming from your insertion site. °· The insertion site feels warm to the touch. °· You have pus or a bad smell coming from your insertion site. °· You have bruising around the insertion site. °· You notice blood collecting in the tissue around the catheter site (hematoma). The hematoma may be painful to the touch. °Get help right away if: °· You have severe pain at the catheter insertion area. °· The catheter insertion area swells very fast. °· The catheter insertion area is bleeding, and the bleeding does not stop when you hold steady pressure on the area. °· The area near or just beyond the catheter insertion site becomes pale, cool, tingly, or numb. °These symptoms may represent a serious problem that is an emergency. Do not wait to see if the symptoms will go away. Get medical help right away. Call your local emergency services (911 in the [place] Do not drive yourself to the hospital. °Summary °· After the procedure, it is common to have bruising and tenderness at the catheter insertion area. °· After the procedure, it is important to rest and drink plenty of fluids. °· Do not take baths, swim, or use a hot tub until your health care provider says it is okay to do so. You may shower 24-48 hours after the procedure or as told by your health care  provider. °· If the catheter site starts bleeding, lie flat and put pressure on the site. If the bleeding does not stop, get help right away. This is a medical emergency. °This information is not intended to replace advice given to you by your health care provider. Make sure you discuss any questions you have with your health care provider. °Document Released: 09/29/2004 Document Revised: 02/16/2016 Document Reviewed: 02/16/2016 °Elsevier Interactive Patient Education © 2018 [employer] ° °

## 2018-01-07 NOTE — Op Note (Addendum)
[facility]  Percutaneous Study/Intervention Procedural Note   Date of Surgery: 01/07/2018  Surgeon(s):DEW,JASON    Assistants:none  Pre-operative Diagnosis: PAD with claudication bilateral lower extremities  Post-operative diagnosis:  Same  Procedure(s) Performed:             1.  Ultrasound guidance for vascular access bilateral femoral arteries             2.  Catheter placement into aorta from bilateral femoral approaches             3.  Aortogram and selective right lower extremity angiogram             4.  Percutaneous transluminal angioplasty of bilateral common iliac arteries and the distal aorta with 7 mm diameter by 4 cm length balloons             5.   Kissing balloon stent extension of bilateral common iliac arteries up into the mid to distal infrarenal aorta with 9 mm diameter by 58 mm length lifestream stents  6.  Right iliac stent placement with 8 mm diameter by 8 cm length life star stent             7.  StarClose closure device bilateral femoral arteries  EBL: 5 cc  Contrast: 105 cc  Fluoro Time: 16.6 minutes  Moderate Conscious Sedation Time: approximately 75 minutes using 5 mg of Versed and 125 mcg of Fentanyl              Indications:  Patient is a 72 y.o.male with continued claudication symptoms despite multiple previous interventions. The patient has noninvasive study showing reduced ABIs bilaterally. The patient is brought in for angiography for further evaluation and potential treatment. Risks and benefits are discussed and informed consent is obtained.   Procedure:  The patient was identified and appropriate procedural time out was performed.  The patient was then placed supine on the table and prepped and draped in the usual sterile fashion. Moderate conscious sedation was administered during a face to face encounter with the patient throughout the procedure with my supervision of the RN administering medicines and monitoring the  patient's vital signs, pulse oximetry, telemetry and mental status throughout from the start of the procedure until the patient was taken to the recovery room. Ultrasound was used to evaluate the left common femoral artery.  It was patent .  A digital ultrasound image was acquired.  A Seldinger needle was used to access the left common femoral artery under direct ultrasound guidance and a permanent image was performed.  A 0.035 J wire was advanced without resistance and a 5Fr sheath was placed.   Initially, the wire and the catheter tract into the aorta but this seemed to stay in a subintimal plane just above the previously placed stent in the left common iliac artery.  I then accessed the right femoral artery under direct ultrasound guidance without difficulty with a micropuncture needle.  A permanent image was recorded.  A micropuncture wire and sheath were placed on the then upsized to a 5 [place] sheath.  A pigtail catheter was placed up into the aorta from the right side.  This demonstrated normal renal arteries and some calcific disease of the aorta but the previously placed stent in the left common iliac artery appeared to be occluded proximally with some filling around the stent.  There was also about a 60 to 70% stenosis of the right external iliac artery below the previously  placed stent in the right side.  The left common iliac stent also appeared to be somewhat constrained but the left external iliac stents were widely opened.  The left femoral endarterectomy site had only mild disease just below the previously placed stent in the distal external iliac artery.  Using an advantage wire and a Kumpe catheter I was then able to gain access across the occluded stent in the proximal left common iliac artery and distal aorta and confirm intraluminal flow in the aorta. Selective right lower extremity angiogram was then performed she was complaining of more claudication on the right side now after his previous  surgeries. This demonstrated a short segment occlusion in the distal SFA and proximal popliteal artery at Hunter's canal.  There also appeared to be some moderate stenosis of the anterior tibial artery proximally with a patent posterior tibial artery without stenosis as a second runoff vessel. It was felt that it was in the patient's best interest to proceed with intervention after these images to avoid a second procedure and a larger amount of contrast and fluoroscopy based off of the findings from the initial angiogram.  Given the aortoiliac disease, only the aortoiliac disease would be amenable to treatment today and we would see how much improvement he made before considering an antegrade access to treat the right SFA lesion another day.  The patient was systemically heparinized and 7 French sheaths were placed bilaterally.  I started by using a 7 mm diameter by 4 cm length angioplasty balloon in both common iliac arteries going up into the distal aorta.  These were inflated to 12 atm for 1 minute.  Completion imaging showed continued occlusion at the top of the left iliac stent and then needed to extend kissing balloon stents up into the aorta to provide inflow.  9 mm diameter by 58 mm length stents were then taken with about 1 to 1-1/2 cm of overlap into the previous stents and taken well into the aorta.  These were inflated to 10 atm simultaneously.  The right external iliac lesion was then treated with an 8 mm diameter by 8 cm length noncovered lifestream her stent to ensure that we did not harm the hypogastric artery flow.  This was postdilated with a 7 mm balloon.  A pigtail catheter was placed back into the aorta just below the renal arteries and completion imaging was performed.  This demonstrated brisk flow through both sets of stents with no greater than 20% residual stenosis in the common iliac arteries or external iliac arteries bilaterally and the stents up into the distal aorta were widely patent.   I elected to terminate the procedure. The sheath was removed and StarClose closure device was deployed in the left femoral artery with excellent hemostatic result.  Similarly, the right femoral artery had its sheath removed and a StarClose closure device deployed with excellent hemostatic result.  The patient was taken to the recovery room in stable condition having tolerated the procedure well.  Findings:               Aortogram:  This demonstrated normal renal arteries and some calcific disease of the aorta but the previously placed stent in the left common iliac artery appeared to be occluded proximally with some filling around the stent.  There was also about a 60 to 70% stenosis of the right external iliac artery below the previously placed stent in the right side.  The left common iliac stent also appeared to be somewhat  constrained but the left external iliac stents were widely opened.  The left femoral endarterectomy site had only mild disease just below the previously placed stent in the distal external iliac artery             Right lower Extremity:  This demonstrated a short segment occlusion in the distal SFA and proximal popliteal artery at Hunter's canal.  There also appeared to be some moderate stenosis of the anterior tibial artery proximally with a patent posterior tibial artery without stenosis as a second runoff vessel.   Disposition: Patient was taken to the recovery room in stable condition having tolerated the procedure well.  Complications: None  [name] 01/07/2018 12:48 PM   This note was created with Dragon Medical transcription system. Any errors in dictation are purely unintentional.

## 2018-01-08 ENCOUNTER — Encounter: Payer: Self-pay | Admitting: Vascular Surgery

## 2018-01-18 ENCOUNTER — Encounter: Payer: Self-pay | Admitting: Urology

## 2018-01-18 ENCOUNTER — Encounter (INDEPENDENT_AMBULATORY_CARE_PROVIDER_SITE_OTHER): Payer: Medicare HMO

## 2018-01-18 ENCOUNTER — Ambulatory Visit (INDEPENDENT_AMBULATORY_CARE_PROVIDER_SITE_OTHER): Payer: Medicare HMO | Admitting: Vascular Surgery

## 2018-01-18 ENCOUNTER — Ambulatory Visit: Payer: Medicare HMO | Admitting: Urology

## 2018-01-21 ENCOUNTER — Telehealth: Payer: Self-pay | Admitting: Urology

## 2018-01-21 NOTE — Telephone Encounter (Signed)
Pt wife states the Myrbetriq samples that was given to him isn't working for the pt, pt is in diapers now. Pt wife asking for advise. Please call [employer] at [phone]. Thanks.

## 2018-01-21 NOTE — Telephone Encounter (Signed)
He missed his follow up appointment with me and needs to reschedule.

## 2018-01-23 NOTE — Telephone Encounter (Signed)
I spoke with his wife and she acted as if they didn't know about the app they missed so he is coming in on Friday.   Sharyn Lull

## 2018-01-25 ENCOUNTER — Ambulatory Visit (INDEPENDENT_AMBULATORY_CARE_PROVIDER_SITE_OTHER): Payer: Medicare HMO | Admitting: Urology

## 2018-01-25 ENCOUNTER — Encounter: Payer: Self-pay | Admitting: Urology

## 2018-01-25 VITALS — BP 135/80 | HR 65 | Ht 65.0 in | Wt 125.0 lb

## 2018-01-25 DIAGNOSIS — N402 Nodular prostate without lower urinary tract symptoms: Secondary | ICD-10-CM

## 2018-01-25 DIAGNOSIS — N3941 Urge incontinence: Secondary | ICD-10-CM | POA: Diagnosis not present

## 2018-01-25 LAB — BLADDER SCAN AMB NON-IMAGING: Scan Result: 0

## 2018-01-25 NOTE — Progress Notes (Signed)
01/25/2018 12:00 PM   Jerry [name] [birth year] [id number]  Referring provider: Marguerita Merles, [place] [address] [place], [place] [address]  Chief Complaint  Patient presents with  . Urinary Incontinence    HPI: Patient is 72 year old African-American male with a history of a prostate nodule and urinary incontinence who presents today for a 3-week follow-up after trial of Myrbetriq for his incontinence.  Background history Patient is a 71 -year-old [place] American male male who is referred to [place] by Dr. [name] for urinary incontinence.  He underwent kissing balloon stents and left external iliac artery stents as well as left femoral endarterectomy on 10/03/2017.  He states he has incontinence with urge.  He is wearing two depends daily.   He was given tamsulosin 0.4 mg by his PCP, but it has not provided relief.   Has recently started tolterodine.  He is having associated urinary frequency, urgency, nocturia and intermittency.  Patient denies any gross hematuria, dysuria or suprapubic/flank pain.  Patient denies any fevers, chills, nausea or vomiting.  He does not have a history of urinary tract infections, STI's or injury to the bladder.   He does not have a history of nephrolithiasis, GU surgery or GU trauma.  She denies constipation and/or diarrhea.  He is drinking 2 bottles of water daily.   He is drinking two cup of coffee in the morning.  He is drinking 2 to 3 Pepsi's daily.  He drinks apple juice.  He drinks sweat tea during the weekend.   He is not drinking alcohol.   He is a smoker.  His PVR is 0 mL.    BPH WITH LUTS  (prostate and/or bladder) IPSS score: 24   PVR: 0 mL   Major complaint(s): He seen no change with the Myrbetriq 25 daily.  He has not changed his fluid intake.  He denies any dysuria, hematuria or suprapubic pain.   Denies any recent fevers, chills, nausea or vomiting. IPSS    Row Name 01/25/18 1100         International Prostate Symptom Score   How often  have you had the sensation of not emptying your bladder?  Almost always     How often have you had to urinate less than every two hours?  Almost always     How often have you found you stopped and started again several times when you urinated?  About half the time     How often have you found it difficult to postpone urination?  Almost always     How often have you had a weak urinary stream?  Not at All     How often have you had to strain to start urination?  More than half the time     How many times did you typically get up at night to urinate?  2 Times     Total IPSS Score  24       Quality of Life due to urinary symptoms   If you were to spend the rest of your life with your urinary condition just the way it is now how would you feel about that?  Unhappy        Score:  1-7 Mild 8-19 Moderate 20-35 Severe  Prostate nodule MRI of prostate scheduled for November 3   PMH: Past Medical History:  Diagnosis Date  . Anemia    patient unaware of this diagnosis  . Cigarette smoker   . Hearing loss   .  Hyperlipidemia   . Leg DVT (deep venous thromboembolism), acute, left ([facility])   . Peripheral vascular disease (HCC)    blockages in both extremities  . Vitamin D deficiency    patient unaware of this diagnosis    Surgical History: Past Surgical History:  Procedure Laterality Date  . ANGIOPLASTY ILLIAC ARTERY Left 10/03/2017   Procedure: ANGIOPLASTY ILIAC ARTERY;  Surgeon: Algernon Huxley, MD;  Location: [facility] ORS;  Service: Vascular;  Laterality: Left;  . BACK SURGERY  1990   Three sx in lower back. metal in lower back  . COLONOSCOPY  01/2010  . ENDARTERECTOMY FEMORAL Left 10/03/2017   Procedure: ENDARTERECTOMY FEMORAL;  Surgeon: Algernon Huxley, MD;  Location: [facility] ORS;  Service: Vascular;  Laterality: Left;  . Left Leg stent Left 2009   fem fem bypass  . LOWER EXTREMITY ANGIOGRAPHY Left 09/19/2017   Procedure: LOWER EXTREMITY ANGIOGRAPHY;  Surgeon: Algernon Huxley, MD;  Location: [facility];  Service: Cardiovascular;  Laterality: Left;  . LOWER EXTREMITY ANGIOGRAPHY Right 01/07/2018   Procedure: LOWER EXTREMITY ANGIOGRAPHY;  Surgeon: Algernon Huxley, MD;  Location: [facility];  Service: Cardiovascular;  Laterality: Right;  . PTCA Right 08/2017  . VASCULAR SURGERY Left 2009   fem fem bypass     Home Medications:  Allergies as of 01/25/2018   No Known Allergies     Medication List        Accurate as of 01/25/18 12:00 PM. Always use your most recent med list.          acetaminophen 325 MG tablet Commonly known as:  TYLENOL Take 650 mg by mouth every 6 (six) hours as needed for moderate pain.   aspirin EC 81 MG tablet Take 81 mg by mouth daily.   atorvastatin 10 MG tablet Commonly known as:  LIPITOR Take 1 tablet (10 mg total) by mouth daily.   cholecalciferol 1000 units tablet Commonly known as:  VITAMIN D Take 1,000 Units by mouth daily.   clopidogrel 75 MG tablet Commonly known as:  PLAVIX Take 1 tablet (75 mg total) by mouth daily.   ibuprofen 600 MG tablet Commonly known as:  ADVIL,MOTRIN Take 1 tablet (600 mg total) by mouth every 8 (eight) hours as needed.   mirabegron ER 25 MG Tb24 tablet Commonly known as:  MYRBETRIQ Take 1 tablet (25 mg total) by mouth daily.   multivitamin capsule Take 1 capsule by mouth as needed.   nicotine 21 mg/24hr patch Commonly known as:  NICODERM CQ - dosed in mg/24 hours Place 1 patch (21 mg total) onto the skin daily.       Allergies: No Known Allergies  Family History: Family History  Problem Relation Age of Onset  . Colon cancer Neg Hx     Social History:  reports that he quit smoking about 6 weeks ago. His smoking use included cigarettes. He has a 40.00 pack-year smoking history. He has never used smokeless tobacco. He reports that he does not drink alcohol or use drugs.  ROS: UROLOGY Frequent Urination?: No Hard to postpone urination?: No Burning/pain with urination?:  No Get up at night to urinate?: No Leakage of urine?: No Urine stream starts and stops?: No Trouble starting stream?: No Do you have to strain to urinate?: No Blood in urine?: No Sexually transmitted disease?: No Injury to kidneys or bladder?: No Painful intercourse?: No Weak stream?: No Erection problems?: No Penile pain?: No  Gastrointestinal Nausea?: No Vomiting?: No Indigestion/heartburn?:  No Diarrhea?: No Constipation?: No  Constitutional Fever: No Night sweats?: No Weight loss?: No Fatigue?: No  Skin Skin rash/lesions?: No  Eyes Blurred vision?: No Double vision?: No  Ears/Nose/Throat Sore throat?: No Sinus problems?: No  Hematologic/Lymphatic Swollen glands?: No Easy bruising?: No  Cardiovascular Leg swelling?: No Chest pain?: No  Respiratory Cough?: No Shortness of breath?: No  Endocrine Excessive thirst?: No  Musculoskeletal Back pain?: No Joint pain?: No  Neurological Headaches?: No Dizziness?: No  Psychologic Depression?: No Anxiety?: No  Physical Exam: BP 135/80 (BP Location: Left Arm, Patient Position: Sitting, Cuff Size: Normal)   Pulse 65   Ht 5\' 5"  (1.651 m)   Wt 125 lb (56.7 kg)   BMI 20.80 kg/m   Constitutional: Well nourished. Alert and oriented, No acute distress. HEENT: [place] AT, moist mucus membranes. Trachea midline, no masses. Cardiovascular: No clubbing, cyanosis, or edema. Respiratory: Normal respiratory effort, no increased work of breathing. Skin: No rashes, bruises or suspicious lesions. Lymph: No cervical or inguinal adenopathy. Neurologic: Grossly intact, no focal deficits, moving all 4 extremities. Psychiatric: Normal mood and affect.  Laboratory Data: Lab Results  Component Value Date   WBC 9.2 10/05/2017   HGB 11.5 (L) 10/05/2017   HCT 32.5 (L) 10/05/2017   MCV 94.9 10/05/2017   PLT 184 10/05/2017    Lab Results  Component Value Date   CREATININE 1.05 01/04/2018    No results found for:  PSA  No results found for: TESTOSTERONE  No results found for: HGBA1C  No results found for: TSH  No results found for: CHOL, HDL, CHOLHDL, VLDL, LDLCALC  Lab Results  Component Value Date   AST 27 07/18/2011   Lab Results  Component Value Date   ALT 19 07/18/2011   No components found for: ALKALINEPHOPHATASE No components found for: BILIRUBINTOTAL  No results found for: ESTRADIOL  Urinalysis    Component Value Date/Time   COLORURINE Amber 07/18/2011 1143   APPEARANCEUR Hazy 07/18/2011 1143   LABSPEC 1.029 07/18/2011 1143   PHURINE 5.0 07/18/2011 1143   GLUCOSEU Negative 07/18/2011 1143   HGBUR 1+ 07/18/2011 1143   BILIRUBINUR Negative 07/18/2011 1143   KETONESUR Negative 07/18/2011 1143   PROTEINUR 30 mg/dL 07/18/2011 1143   NITRITE Negative 07/18/2011 1143   LEUKOCYTESUR Negative 07/18/2011 1143    I have reviewed the labs.   Pertinent Imaging: Results for JOFFREY, KERCE (MRN [id number]) as of 01/25/2018 11:52  Ref. Range 01/25/2018 11:11  Scan Result Unknown 0   Assessment & Plan:    1. Urge Incontinence Discussed behavioral therapies, bladder training and bladder control strategies, again Encouraged patient to decrease his Pepsi consumption, again Will increase the Myrbetriq to 50 mg and reassess when he returns for MRI of prostate results  Bladder Scan (Post Void Residual) in office  2. Prostate nodule MRI of prostate pending  Return for For MRI of prostate results .  These notes generated with voice recognition software. I apologize for typographical errors.  Zara Council, PA-C  [facility] [address] [place], [place] [address] [phone]

## 2018-01-27 ENCOUNTER — Ambulatory Visit
Admission: RE | Admit: 2018-01-27 | Discharge: 2018-01-27 | Disposition: A | Discharge status: Home / Self Care | Payer: Medicare HMO | Source: Ambulatory Visit | Attending: Urology | Admitting: Urology

## 2018-01-27 DIAGNOSIS — N402 Nodular prostate without lower urinary tract symptoms: Secondary | ICD-10-CM

## 2018-01-27 MED ORDER — GADOBENATE DIMEGLUMINE 529 MG/ML IV SOLN
12.0000 mL | Freq: Once | INTRAVENOUS | Status: AC | PRN
  Administered 2018-01-27: 12 mL via INTRAVENOUS

## 2018-01-29 ENCOUNTER — Telehealth: Payer: Self-pay | Admitting: Urology

## 2018-01-29 ENCOUNTER — Ambulatory Visit: Payer: Medicare HMO | Admitting: Urology

## 2018-01-29 NOTE — Telephone Encounter (Signed)
Pt wife lmom to reschedule appt, but wanted to know if she/they can still get his results. Unable to reach pt at either number.  I rescheduled pt for tomorrow with [facility] @ 12:45, Please advise pt. Thank you

## 2018-01-30 ENCOUNTER — Ambulatory Visit: Payer: Medicare HMO | Admitting: Urology

## 2018-01-30 ENCOUNTER — Encounter: Payer: Self-pay | Admitting: Urology

## 2018-02-01 ENCOUNTER — Other Ambulatory Visit (INDEPENDENT_AMBULATORY_CARE_PROVIDER_SITE_OTHER): Payer: Self-pay | Admitting: Vascular Surgery

## 2018-02-01 DIAGNOSIS — I739 Peripheral vascular disease, unspecified: Secondary | ICD-10-CM

## 2018-02-05 ENCOUNTER — Encounter (INDEPENDENT_AMBULATORY_CARE_PROVIDER_SITE_OTHER): Payer: Self-pay | Admitting: Vascular Surgery

## 2018-02-05 ENCOUNTER — Ambulatory Visit (INDEPENDENT_AMBULATORY_CARE_PROVIDER_SITE_OTHER): Payer: Medicare HMO

## 2018-02-05 ENCOUNTER — Ambulatory Visit (INDEPENDENT_AMBULATORY_CARE_PROVIDER_SITE_OTHER): Payer: Medicare HMO | Admitting: Vascular Surgery

## 2018-02-05 VITALS — BP 113/70 | HR 68 | Resp 16 | Ht 65.5 in | Wt 125.0 lb

## 2018-02-05 DIAGNOSIS — E785 Hyperlipidemia, unspecified: Secondary | ICD-10-CM | POA: Diagnosis not present

## 2018-02-05 DIAGNOSIS — I739 Peripheral vascular disease, unspecified: Secondary | ICD-10-CM | POA: Diagnosis not present

## 2018-02-05 DIAGNOSIS — I70219 Atherosclerosis of native arteries of extremities with intermittent claudication, unspecified extremity: Secondary | ICD-10-CM | POA: Diagnosis not present

## 2018-02-05 NOTE — Progress Notes (Signed)
MRN : [id number]  Jerry [name] is a 72 y.o. (11/09/1945) male who presents with chief complaint of  Chief Complaint  Patient presents with  . Follow-up    4 week ABI [facility] f/u  .  History of Present Illness: Patient returns today in follow up of PAD.  After his most recent intervention, his legs are feeling much better.  He really is not bothered by any current claudication or rest pain.  His ABIs today are markedly improved up to 0.86 on the right and remain normal at 1.2 on the left.  He has biphasic waveforms on the right and triphasic waveforms on the left.  Current Outpatient Medications  Medication Sig Dispense Refill  . acetaminophen (TYLENOL) 325 MG tablet Take 650 mg by mouth every 6 (six) hours as needed for moderate pain.    Marland Kitchen aspirin EC 81 MG tablet Take 81 mg by mouth daily.    Marland Kitchen atorvastatin (LIPITOR) 10 MG tablet Take 1 tablet (10 mg total) by mouth daily. 30 tablet 11  . cholecalciferol (VITAMIN D) 1000 units tablet Take 1,000 Units by mouth daily.    . clopidogrel (PLAVIX) 75 MG tablet Take 1 tablet (75 mg total) by mouth daily. 30 tablet 5  . ibuprofen (ADVIL,MOTRIN) 600 MG tablet Take 1 tablet (600 mg total) by mouth every 8 (eight) hours as needed. 15 tablet 0  . mirabegron ER (MYRBETRIQ) 25 MG TB24 tablet Take 1 tablet (25 mg total) by mouth daily. (Patient not taking: Reported on 01/25/2018) 1 tablet 0  . Multiple Vitamin (MULTIVITAMIN) capsule Take 1 capsule by mouth as needed.     . nicotine (NICODERM CQ - DOSED IN MG/24 HOURS) 21 mg/24hr patch Place 1 patch (21 mg total) onto the skin daily. 28 patch 1   No current facility-administered medications for this visit.     Past Medical History:  Diagnosis Date  . Anemia    patient unaware of this diagnosis  . Cigarette smoker   . Hearing loss   . Hyperlipidemia   . Leg DVT (deep venous thromboembolism), acute, left ([place])   . Peripheral vascular disease (HCC)    blockages in both extremities  . Vitamin D  deficiency    patient unaware of this diagnosis    Past Surgical History:  Procedure Laterality Date  . ANGIOPLASTY ILLIAC ARTERY Left 10/03/2017   Procedure: ANGIOPLASTY ILIAC ARTERY;  Surgeon: Algernon Huxley, MD;  Location: [facility] ORS;  Service: Vascular;  Laterality: Left;  . BACK SURGERY  1990   Three sx in lower back. metal in lower back  . COLONOSCOPY  01/2010  . ENDARTERECTOMY FEMORAL Left 10/03/2017   Procedure: ENDARTERECTOMY FEMORAL;  Surgeon: Algernon Huxley, MD;  Location: [facility] ORS;  Service: Vascular;  Laterality: Left;  . Left Leg stent Left 2009   fem fem bypass  . LOWER EXTREMITY ANGIOGRAPHY Left 09/19/2017   Procedure: LOWER EXTREMITY ANGIOGRAPHY;  Surgeon: Algernon Huxley, MD;  Location: [facility];  Service: Cardiovascular;  Laterality: Left;  . LOWER EXTREMITY ANGIOGRAPHY Right 01/07/2018   Procedure: LOWER EXTREMITY ANGIOGRAPHY;  Surgeon: Algernon Huxley, MD;  Location: [facility];  Service: Cardiovascular;  Laterality: Right;  . PTCA Right 08/2017  . VASCULAR SURGERY Left 2009   fem fem bypass     Social History Social History   Tobacco Use  . Smoking status: Former Smoker    Packs/day: 1.00    Years: 40.00    Pack  years: 40.00    Types: Cigarettes    Last attempt to quit: 12/08/2017    Years since quitting: 0.1  . Smokeless tobacco: Never Used  . Tobacco comment: pt states smoke 3 cigarettes a day  Substance Use Topics  . Alcohol use: Never    Frequency: Never  . Drug use: Never     Family History Family History  Problem Relation Age of Onset  . Colon cancer Neg Hx     No Known Allergies   REVIEW OF SYSTEMS (Negative unless checked)  Constitutional: '[]'$ Weight loss  '[]'$ Fever  '[]'$ Chills Cardiac: '[]'$ Chest pain   '[]'$ Chest pressure   '[]'$ Palpitations   '[]'$ Shortness of breath when laying flat   '[]'$ Shortness of breath at rest   '[]'$ Shortness of breath with exertion. Vascular:  '[x]'$ Pain in legs with walking   '[]'$ Pain in legs at rest   '[]'$ Pain in legs when  laying flat   '[x]'$ Claudication   '[]'$ Pain in feet when walking  '[]'$ Pain in feet at rest  '[]'$ Pain in feet when laying flat   '[]'$ History of DVT   '[]'$ Phlebitis   '[]'$ Swelling in legs   '[]'$ Varicose veins   '[]'$ Non-healing ulcers Pulmonary:   '[]'$ Uses home oxygen   '[]'$ Productive cough   '[]'$ Hemoptysis   '[]'$ Wheeze  '[]'$ COPD   '[]'$ Asthma Neurologic:  '[]'$ Dizziness  '[]'$ Blackouts   '[]'$ Seizures   '[]'$ History of stroke   '[]'$ History of TIA  '[]'$ Aphasia   '[]'$ Temporary blindness   '[]'$ Dysphagia   '[]'$ Weakness or numbness in arms   '[]'$ Weakness or numbness in legs Musculoskeletal:  '[]'$ Arthritis   '[]'$ Joint swelling   '[]'$ Joint pain   '[]'$ Low back pain Hematologic:  '[]'$ Easy bruising  '[]'$ Easy bleeding   '[]'$ Hypercoagulable state   '[]'$ Anemic   Gastrointestinal:  '[]'$ Blood in stool   '[]'$ Vomiting blood  '[]'$ Gastroesophageal reflux/heartburn   '[]'$ Abdominal pain Genitourinary:  '[]'$ Chronic kidney disease   '[]'$ Difficult urination  '[]'$ Frequent urination  '[]'$ Burning with urination   '[]'$ Hematuria Skin:  '[]'$ Rashes   '[]'$ Ulcers   '[]'$ Wounds Psychological:  '[]'$ History of anxiety   '[]'$  History of major depression.  Physical Examination  BP 113/70 (BP Location: Right Arm, Patient Position: Sitting)   Pulse 68   Resp 16   Ht 5' 5.5" (1.664 m)   Wt 125 lb (56.7 kg)   BMI 20.48 kg/m  Gen:  WD/WN, NAD Head: [place]/AT, No temporalis wasting. Ear/Nose/Throat: Hearing grossly intact, nares w/o erythema or drainage Eyes: Conjunctiva clear. Sclera non-icteric Neck: Supple.  Trachea midline Pulmonary:  Good air movement, no use of accessory muscles.  Cardiac: RRR, no JVD Vascular:  Vessel Right Left  Radial Palpable Palpable                          PT 1+ Palpable Palpable  DP Palpable Palpable     Musculoskeletal: M/S 5/5 throughout.  No deformity or atrophy. no edema. Neurologic: Sensation grossly intact in extremities.  Symmetrical.  Speech is fluent.  Psychiatric: Judgment intact, Mood & affect appropriate for pt's clinical situation. Dermatologic: No rashes or ulcers noted.  No  cellulitis or open wounds.       Labs Recent Results (from the past 2160 hour(s))  Bladder Scan (Post Void Residual) in office     Status: None   Collection Time: 12/26/17 11:06 AM  Result Value Ref Range   Scan Result 0   PSA     Status: None   Collection Time: 12/26/17 11:50 AM  Result Value Ref Range   Prostate Specific Ag, Serum  3.7 0.0 - 4.0 ng/mL    Comment: Roche ECLIA methodology. According to the American Urological Association, Serum PSA should decrease and remain at undetectable levels after radical prostatectomy. The AUA defines biochemical recurrence as an initial PSA value 0.2 ng/mL or greater followed by a subsequent confirmatory PSA value 0.2 ng/mL or greater. Values obtained with different assay methods or kits cannot be used interchangeably. Results cannot be interpreted as absolute evidence of the presence or absence of malignant disease.   BUN     Status: None   Collection Time: 01/04/18 10:21 AM  Result Value Ref Range   BUN 15 8 - 23 mg/dL    Comment: Performed at [facility], [address] [place], [place] [address]  Creatinine, serum     Status: None   Collection Time: 01/04/18 10:21 AM  Result Value Ref Range   Creatinine, Ser 1.05 0.61 - 1.24 mg/dL   GFR calc non Af Amer >60 >60 mL/min   GFR calc Af Amer >60 >60 mL/min    Comment: (NOTE) The eGFR has been calculated using the CKD EPI equation. This calculation has not been validated in all clinical situations. eGFR's persistently <60 mL/min signify possible Chronic Kidney Disease. Performed at [facility], [address], [place], [place] [address]   Bladder Scan (Post Void Residual) in office     Status: None   Collection Time: 01/25/18 11:11 AM  Result Value Ref Range   Scan Result 0     Radiology Mr Prostate W Wo Contrast  Result Date: 01/28/2018 CLINICAL DATA:  PSA the of 3.7. Prior benign workup. Prostate nodule. EXAM: MR PROSTATE WITHOUT AND WITH CONTRAST  TECHNIQUE: Multiplanar multisequence MRI images were obtained of the pelvis centered about the prostate. Pre and post contrast images were obtained. CONTRAST:  34m MULTIHANCE GADOBENATE DIMEGLUMINE 529 MG/ML IV SOLN COMPARISON:  07/18/2011 stone study. FINDINGS: Prostate: Demonstrates a left mid gland peripheral zone 10 x 11 x 12 mm T2 hypointense nodule on image 13/8 and 14/9. This corresponds to restricted diffusion on image 13/11 and early post-contrast enhancement on image 12/23. Volume: 3.3 x 4.5 x 3.8 cm (volume = 30 cm^3) Transcapsular spread: Equivocal. Although there is apparent irregularity of the capsule on image 14/8, no correlate is seen on sagittal series 5. Seminal vesicle involvement: Absent Neurovascular bundle involvement: Absent Pelvic adenopathy: Absent Bone metastasis: Absent Other findings: No significant free fluid. Evaluation of the upper and mid pelvis (majority of the extraprostatic pelvis) limited secondary to susceptibility artifact from lumbosacral spine hardware. IMPRESSION: 1. 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 [id number]. 2. Equivocal transcapsular spread as detailed above. 3. Limitation secondary to susceptibility artifact from lumbosacral spine fixation. Electronically Signed   By: KAbigail MiyamotoM.D.   On: 01/28/2018 08:29    Assessment/Plan  Hyperlipidemia lipid control important in reducing the progression of atherosclerotic disease. Continue statin therapy   Atherosclerosis of lower extremity with claudication ([place]  His ABIs today are markedly improved up to 0.86 on the right and remain normal at 1.2 on the left.  He has biphasic waveforms on the right and triphasic waveforms on the left. Symptoms are now much improved but it is required multiple procedures to get there.  He will remain on aspirin, Plavix, and Lipitor.  I will see him back in about 3  months.    JLeotis Pain MD  02/05/2018 9:17 AM  This note was created with Dragon medical transcription system.  Any errors from dictation are purely unintentional

## 2018-02-05 NOTE — Assessment & Plan Note (Signed)
His ABIs today are markedly improved up to 0.86 on the right and remain normal at 1.2 on the left.  He has biphasic waveforms on the right and triphasic waveforms on the left. Symptoms are now much improved but it is required multiple procedures to get there.  He will remain on aspirin, Plavix, and Lipitor.  I will see him back in about 3 months.

## 2018-02-05 NOTE — Assessment & Plan Note (Signed)
lipid control important in reducing the progression of atherosclerotic disease. Continue statin therapy  

## 2018-02-05 NOTE — Patient Instructions (Signed)

## 2018-02-06 ENCOUNTER — Telehealth: Payer: Self-pay

## 2018-02-06 NOTE — Telephone Encounter (Signed)
-----   Message from Nori Riis, PA-C sent at 02/05/2018  5:08 PM EST ----- Please have patient reschedule his appointment to discuss his MRI of his prostate results.  He has findings that are concerning for cancer and we need to discuss his next steps.

## 2018-02-06 NOTE — Telephone Encounter (Signed)
Please call and schedule this pt to come in for office visit to discuss MRI.

## 2018-02-06 NOTE — Telephone Encounter (Signed)
Patient has been reschd and I spoke with his wife and let her know that he needed to be here. I made the app with [place] because that who it was with last week.   Sharyn Lull

## 2018-02-12 ENCOUNTER — Ambulatory Visit (INDEPENDENT_AMBULATORY_CARE_PROVIDER_SITE_OTHER): Payer: Medicare HMO | Admitting: Urology

## 2018-02-12 ENCOUNTER — Encounter: Payer: Self-pay | Admitting: Urology

## 2018-02-12 VITALS — BP 160/68 | HR 80 | Ht 65.5 in | Wt 127.1 lb

## 2018-02-12 DIAGNOSIS — R935 Abnormal findings on diagnostic imaging of other abdominal regions, including retroperitoneum: Secondary | ICD-10-CM | POA: Insufficient documentation

## 2018-02-12 DIAGNOSIS — N3941 Urge incontinence: Secondary | ICD-10-CM

## 2018-02-12 DIAGNOSIS — N402 Nodular prostate without lower urinary tract symptoms: Secondary | ICD-10-CM

## 2018-02-12 MED ORDER — MIRABEGRON ER 50 MG PO TB24: 50.0000 mg | ORAL_TABLET | Freq: Every day | ORAL | 3 refills | Status: DC

## 2018-02-12 NOTE — Progress Notes (Signed)
02/12/2018 7:19 PM   Jerry [name] Aug 02, 1945 [id number]  Referring provider: Marguerita Merles, [place], [place] [address]  Chief Complaint  Patient presents with  . Results    HPI: 72 year old male initially seen by [facility] for lower urinary tract symptoms with urge incontinence.  He was incidentally noted on DRE to have a left prostate nodule.  A PSA on 12/26/2017 was 3.7 and there are no previous PSAs for comparison.  Prostate MRI was performed on 01/27/2018 which showed a 30 g gland and a 12 mm hypointense nodule within the left peripheral zone in the mid gland felt consistent with high-grade carcinoma.  There was equivocal extracapsular extension.  No abnormalities of the seminal vesicles, neurovascular bundles or pelvic adenopathy.  Regarding his lower urinary tract symptoms he was initially started on Myrbetriq 25 mg with mild improvement in his symptoms.  This was titrated to 50 mg and he has noted significant improvement.  He recently underwent fairly significant percutaneous intervention by vascular surgery of his common iliacs/distal aorta and right iliac stent placement.  He is on aspirin and Plavix.   PMH: Past Medical History:  Diagnosis Date  . Anemia    patient unaware of this diagnosis  . Cigarette smoker   . Hearing loss   . Hyperlipidemia   . Leg DVT (deep venous thromboembolism), acute, left ([place])   . Peripheral vascular disease (HCC)    blockages in both extremities  . Vitamin D deficiency    patient unaware of this diagnosis    Surgical History: Past Surgical History:  Procedure Laterality Date  . ANGIOPLASTY ILLIAC ARTERY Left 10/03/2017   Procedure: ANGIOPLASTY ILIAC ARTERY;  Surgeon: Algernon Huxley, MD;  Location: [facility] ORS;  Service: Vascular;  Laterality: Left;  . BACK SURGERY  1990   Three sx in lower back. metal in lower back  . COLONOSCOPY  01/2010  . ENDARTERECTOMY FEMORAL Left 10/03/2017   Procedure: ENDARTERECTOMY FEMORAL;   Surgeon: Algernon Huxley, MD;  Location: [facility] ORS;  Service: Vascular;  Laterality: Left;  . Left Leg stent Left 2009   fem fem bypass  . LOWER EXTREMITY ANGIOGRAPHY Left 09/19/2017   Procedure: LOWER EXTREMITY ANGIOGRAPHY;  Surgeon: Algernon Huxley, MD;  Location: [place] [facility];  Service: Cardiovascular;  Laterality: Left;  . LOWER EXTREMITY ANGIOGRAPHY Right 01/07/2018   Procedure: LOWER EXTREMITY ANGIOGRAPHY;  Surgeon: Algernon Huxley, MD;  Location: [place] [facility];  Service: Cardiovascular;  Laterality: Right;  . PTCA Right 08/2017  . VASCULAR SURGERY Left 2009   fem fem bypass     Home Medications:  Allergies as of 02/12/2018   No Known Allergies     Medication List        Accurate as of 02/12/18  7:19 PM. Always use your most recent med list.          acetaminophen 325 MG tablet Commonly known as:  TYLENOL Take 650 mg by mouth every 6 (six) hours as needed for moderate pain.   aspirin EC 81 MG tablet Take 81 mg by mouth daily.   atorvastatin 10 MG tablet Commonly known as:  LIPITOR Take 1 tablet (10 mg total) by mouth daily.   cholecalciferol 1000 units tablet Commonly known as:  VITAMIN D Take 1,000 Units by mouth daily.   clopidogrel 75 MG tablet Commonly known as:  PLAVIX Take 1 tablet (75 mg total) by mouth daily.   ibuprofen 600 MG tablet Commonly known  as:  ADVIL,MOTRIN Take 1 tablet (600 mg total) by mouth every 8 (eight) hours as needed.   mirabegron ER 25 MG Tb24 tablet Commonly known as:  MYRBETRIQ Take 1 tablet (25 mg total) by mouth daily.   multivitamin capsule Take 1 capsule by mouth as needed.   nicotine 21 mg/24hr patch Commonly known as:  NICODERM CQ - dosed in mg/24 hours Place 1 patch (21 mg total) onto the skin daily.       Allergies: No Known Allergies  Family History: Family History  Problem Relation Age of Onset  . Colon cancer Neg Hx     Social History:  reports that he quit smoking about 2 months ago. His smoking  use included cigarettes. He has a 40.00 pack-year smoking history. He has never used smokeless tobacco. He reports that he does not drink alcohol or use drugs.  ROS: UROLOGY Frequent Urination?: No Hard to postpone urination?: No Burning/pain with urination?: No Get up at night to urinate?: No Leakage of urine?: No Urine stream starts and stops?: No Trouble starting stream?: No Do you have to strain to urinate?: No Blood in urine?: No Urinary tract infection?: No Sexually transmitted disease?: No Injury to kidneys or bladder?: No Painful intercourse?: No Weak stream?: No Erection problems?: No Penile pain?: No  Gastrointestinal Nausea?: No Vomiting?: No Indigestion/heartburn?: No Diarrhea?: No Constipation?: No  Constitutional Fever: No Night sweats?: No Weight loss?: No Fatigue?: No  Skin Skin rash/lesions?: No Itching?: No  Eyes Blurred vision?: No Double vision?: No  Ears/Nose/Throat Sore throat?: No Sinus problems?: No  Hematologic/Lymphatic Swollen glands?: No Easy bruising?: No  Cardiovascular Leg swelling?: No Chest pain?: No  Respiratory Cough?: No Shortness of breath?: No  Endocrine Excessive thirst?: No  Musculoskeletal Back pain?: No Joint pain?: No  Neurological Headaches?: No Dizziness?: No  Psychologic Depression?: No Anxiety?: No  Physical Exam: BP (!) 160/68 (BP Location: Left Arm, Patient Position: Sitting, Cuff Size: Normal)   Pulse 80   Ht 5' 5.5" (1.664 m)   Wt 127 lb 1.6 oz (57.7 kg)   BMI 20.83 kg/m   Constitutional:  Alert and oriented, No acute distress. HEENT: [place] AT, moist mucus membranes.  Trachea midline, no masses. Cardiovascular: No clubbing, cyanosis, or edema. Respiratory: Normal respiratory effort, no increased work of breathing.   Assessment & Plan:   72 year old male with a left prostate nodule and MRI showing a PI-RADS 5 lesion in the left peripheral zone at the mid gland.  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  Fusion biopsy was discussed and they were informed this would need to be scheduled at [facility] in [place].  This may also be amenable to a cognitive biopsy and will discuss with my [place] partners.  Will also need to check with Dr. Lucky Cowboy to see when he would be able to come off aspirin and Plavix for prostate biopsy.  He still has Myrbetriq samples and requested a prescription be sent to his pharmacy.   Abbie Sons, [place] [address], [place], [place] [address] [phone]

## 2018-02-14 ENCOUNTER — Encounter: Payer: Self-pay | Admitting: Urology

## 2018-02-21 ENCOUNTER — Emergency Department
Admission: EM | Admit: 2018-02-21 | Discharge: 2018-02-21 | Disposition: A | Discharge status: Home / Self Care | Payer: Medicare HMO | Attending: Emergency Medicine | Admitting: Emergency Medicine

## 2018-02-21 ENCOUNTER — Other Ambulatory Visit: Payer: Self-pay

## 2018-02-21 DIAGNOSIS — Y93G1 Activity, food preparation and clean up: Secondary | ICD-10-CM | POA: Diagnosis not present

## 2018-02-21 DIAGNOSIS — Y998 Other external cause status: Secondary | ICD-10-CM | POA: Insufficient documentation

## 2018-02-21 DIAGNOSIS — Y929 Unspecified place or not applicable: Secondary | ICD-10-CM | POA: Insufficient documentation

## 2018-02-21 DIAGNOSIS — Z7982 Long term (current) use of aspirin: Secondary | ICD-10-CM | POA: Insufficient documentation

## 2018-02-21 DIAGNOSIS — S6992XA Unspecified injury of left wrist, hand and finger(s), initial encounter: Secondary | ICD-10-CM | POA: Diagnosis present

## 2018-02-21 DIAGNOSIS — S68629A Partial traumatic transphalangeal amputation of unspecified finger, initial encounter: Secondary | ICD-10-CM

## 2018-02-21 DIAGNOSIS — Z87891 Personal history of nicotine dependence: Secondary | ICD-10-CM | POA: Diagnosis not present

## 2018-02-21 DIAGNOSIS — Z23 Encounter for immunization: Secondary | ICD-10-CM | POA: Insufficient documentation

## 2018-02-21 DIAGNOSIS — S68623A Partial traumatic transphalangeal amputation of left middle finger, initial encounter: Secondary | ICD-10-CM | POA: Insufficient documentation

## 2018-02-21 DIAGNOSIS — W268XXA Contact with other sharp object(s), not elsewhere classified, initial encounter: Secondary | ICD-10-CM | POA: Diagnosis not present

## 2018-02-21 MED ORDER — LIDOCAINE-EPINEPHRINE 2 %-1:100000 IJ SOLN
20.0000 mL | Freq: Once | INTRAMUSCULAR | Status: AC
  Administered 2018-02-21: 5 mL via INTRADERMAL
  Filled 2018-02-21: qty 1

## 2018-02-21 MED ORDER — TETANUS-DIPHTH-ACELL PERTUSSIS 5-2.5-18.5 LF-MCG/0.5 IM SUSP
0.5000 mL | Freq: Once | INTRAMUSCULAR | Status: AC
  Administered 2018-02-21: 0.5 mL via INTRAMUSCULAR
  Filled 2018-02-21: qty 0.5

## 2018-02-21 NOTE — Discharge Instructions (Signed)
Today I placed a total of 9 stiches in your finger all of which are absorbable and none of which have to be removed.  Please do not get your finger wet for at least 24 hours and after that make sure you dry it off thoroughly after washing it.  Follow up with your PMD this coming Monday for a wound check and return to the ED sooner for any concerns such as worsening pain, fevers, chills, discharge, foul smell, or for any other concerns.  It was a pleasure to take care of you today, and thank you for coming to our emergency department.  If you have any questions or concerns before leaving please ask the nurse to grab me and I'm more than happy to go through your aftercare instructions again.  If you have any concerns once you are home that you are not improving or are in fact getting worse before you can make it to your follow-up appointment, please do not hesitate to call [phone] and come back for further evaluation.  Darel Hong, MD

## 2018-02-21 NOTE — ED Provider Notes (Signed)
[facility] Emergency Department Provider Note  ____________________________________________   First MD Initiated Contact with [name] 02/21/18 0408     (approximate)  I have reviewed Jerry triage vital signs and Jerry nursing notes.   HISTORY  Chief Complaint Laceration   HPI Jerry [name] is a 72 y.o. male right-hand-dominant who comes to Jerry emergency department with a laceration to his left third finger.  He was preparing sweet potatoes for Thanksgiving when he cut his finger.  This began acutely roughly 5 hours prior to arrival.  Jerry [name] takes both Plavix and aspirin and he has had difficulty controlling Jerry bleeding.  His symptoms were sudden onset with moderate to severe sharp aching burning pain.  Jerry pain is slowly improved with time.  Denies numbness or weakness.    Past Medical History:  Diagnosis Date  . Anemia    [name] unaware of this diagnosis  . Cigarette smoker   . Hearing loss   . Hyperlipidemia   . Leg DVT (deep venous thromboembolism), acute, left ([place])   . Peripheral vascular disease (HCC)    blockages in both extremities  . Vitamin D deficiency    [name] unaware of this diagnosis    [name] Active Problem List   Diagnosis Date Noted  . Prostate nodule 02/12/2018  . Abnormal MRI, pelvis 02/12/2018  . Incontinence of urine 12/21/2017  . Atherosclerosis with claudication of extremity ([place]) 10/03/2017  . Abnormal finding on EKG 09/25/2017  . Abdominal wall mass of right lower quadrant 09/14/2017  . Loss of weight 09/14/2017  . Hyperlipidemia 09/04/2017  . Tobacco use disorder 09/04/2017  . Atherosclerosis of lower extremity with claudication ([place]) 09/04/2017  . Claudication ([place]) 06/14/2017    Past Surgical History:  Procedure Laterality Date  . ANGIOPLASTY ILLIAC ARTERY Left 10/03/2017   Procedure: ANGIOPLASTY ILIAC ARTERY;  Surgeon: Algernon Huxley, MD;  Location: [facility] ORS;  Service: Vascular;  Laterality: Left;  . BACK  SURGERY  1990   Three sx in lower back. metal in lower back  . COLONOSCOPY  01/2010  . ENDARTERECTOMY FEMORAL Left 10/03/2017   Procedure: ENDARTERECTOMY FEMORAL;  Surgeon: Algernon Huxley, MD;  Location: [facility] ORS;  Service: Vascular;  Laterality: Left;  . Left Leg stent Left 2009   fem fem bypass  . LOWER EXTREMITY ANGIOGRAPHY Left 09/19/2017   Procedure: LOWER EXTREMITY ANGIOGRAPHY;  Surgeon: Algernon Huxley, MD;  Location: [place] [facility];  Service: Cardiovascular;  Laterality: Left;  . LOWER EXTREMITY ANGIOGRAPHY Right 01/07/2018   Procedure: LOWER EXTREMITY ANGIOGRAPHY;  Surgeon: Algernon Huxley, MD;  Location: [place] [facility];  Service: Cardiovascular;  Laterality: Right;  . PTCA Right 08/2017  . VASCULAR SURGERY Left 2009   fem fem bypass     Prior to Admission medications   Medication Sig Start Date End Date Taking? Authorizing Provider  acetaminophen (TYLENOL) 325 MG tablet Take 650 mg by mouth every 6 (six) hours as needed for moderate pain.    [provider]  aspirin EC 81 MG tablet Take 81 mg by mouth daily.    [provider]  atorvastatin (LIPITOR) 10 MG tablet Take 1 tablet (10 mg total) by mouth daily. 01/07/18 01/07/19  Algernon Huxley, MD  cholecalciferol (VITAMIN D) 1000 units tablet Take 1,000 Units by mouth daily.    [provider]  clopidogrel (PLAVIX) 75 MG tablet Take 1 tablet (75 mg total) by mouth daily. 10/05/17   Stegmayer, Janalyn Harder, PA-C  ibuprofen (ADVIL,MOTRIN) 600 MG tablet Take 1 tablet (600 mg total) by mouth every 8 (eight) hours as needed. 12/06/17   Sable Feil, PA-C  mirabegron ER (MYRBETRIQ) 50 MG TB24 tablet Take 1 tablet (50 mg total) by mouth daily. 02/12/18   Stoioff, Ronda Fairly, MD  Multiple Vitamin (MULTIVITAMIN) capsule Take 1 capsule by mouth as needed.     [provider]  nicotine (NICODERM CQ - DOSED IN MG/24 HOURS) 21 mg/24hr patch Place 1 patch (21 mg total) onto Jerry skin daily. 10/06/17   Stegmayer,  Janalyn Harder, PA-C    Allergies [name] has no known allergies.  Family History  Problem Relation Age of Onset  . Colon cancer Neg Hx     Social History Social History   Tobacco Use  . Smoking status: Former Smoker    Packs/day: 1.00    Years: 40.00    Pack years: 40.00    Types: Cigarettes    Last attempt to quit: 12/08/2017    Years since quitting: 0.2  . Smokeless tobacco: Never Used  . Tobacco comment: pt states smoke 3 cigarettes a day  Substance Use Topics  . Alcohol use: Never    Frequency: Never  . Drug use: Never    Review of Systems Constitutional: No fever/chills Cardiovascular: Denies chest pain. Respiratory: Denies shortness of breath. Gastrointestinal: No abdominal pain.  Positive for nausea negative for vomiting Musculoskeletal: Positive for finger pain Skin: Positive for wound Neurological: Negative for headaches, focal weakness or numbness.   ____________________________________________   PHYSICAL EXAM:  VITAL SIGNS: ED Triage Vitals [02/21/18 0406]  Enc Vitals Group     BP (!) 146/85     Pulse Rate 86     Resp 20     Temp 97.9 F (36.6 C)     Temp Source Oral     SpO2 99 %     Weight 127 lb (57.6 kg)     Height 5\' 5"  (1.651 m)     Head Circumference      Peak Flow      Pain Score 10     Pain Loc      Pain Edu?      Excl. in [place]?     Constitutional: Alert and oriented x4 pleasant cooperative speaks in full clear sentences no diaphoresis Cardiovascular: Normal rate, regular rhythm. Grossly normal heart sounds.  Good peripheral circulation. Respiratory: Normal respiratory effort.  No retractions. Lungs CTAB and moving good air Musculoskeletal: Laceration through Jerry distal phalanx of his left middle finger area of Jerry laceration is roughly 4 cm in length and he is nearly amputated Jerry fingertip.  No bony involvement. Neurologic:  Normal speech and language. No gross focal neurologic deficits are appreciated. Skin: Laceration as  above Psychiatric: Mood and affect are normal. Speech and behavior are normal.    ____________________________________________   DIFFERENTIAL includes but not limited to  Laceration, fracture, amputation, arterial injury ____________________________________________   LABS (all labs ordered are listed, but only abnormal results are displayed)  Labs Reviewed - No data to display   __________________________________________  EKG   ____________________________________________  RADIOLOGY   ____________________________________________   PROCEDURES  Procedure(s) performed: Yes  .Nerve Block Date/Time: 02/21/2018 4:49 AM Performed by: Darel Hong, MD Authorized by: Darel Hong, MD   Consent:    Consent obtained:  Verbal   Consent given by:  [name]   Risks discussed:  Pain, unsuccessful block, allergic reaction and nerve damage   Alternatives discussed:  Alternative  treatment Indications:    Indications:  Pain relief and procedural anesthesia Location:    Body area:  Upper extremity   Upper extremity nerve blocked: Middle finger.   Laterality:  Left Pre-procedure details:    Skin preparation:  2% chlorhexidine Skin anesthesia (see MAR for exact dosages):    Skin anesthesia method:  None Procedure details (see MAR for exact dosages):    Block needle gauge:  25 G   Anesthetic injected:  Lidocaine 2% WITH epi   Steroid injected:  None   Additive injected:  None   Injection procedure:  Anatomic landmarks identified, anatomic landmarks palpated, incremental injection and negative aspiration for blood   Paresthesia:  None Post-procedure details:    Dressing:  None   Outcome:  Anesthesia achieved   [name] tolerance of procedure:  Tolerated well, no immediate complications .Marland KitchenLaceration Repair Date/Time: 02/21/2018 4:49 AM Performed by: Darel Hong, MD Authorized by: Darel Hong, MD   Consent:    Consent obtained:  Verbal   Consent given by:   [name]   Risks discussed:  Pain, poor wound healing and poor cosmetic result   Alternatives discussed:  Delayed treatment Anesthesia (see MAR for exact dosages):    Anesthesia method:  Nerve block Laceration details:    Location:  Finger   Finger location:  L long finger   Length (cm):  4 Repair type:    Repair type:  Complex Pre-procedure details:    Preparation:  [name] was prepped and draped in usual sterile fashion Exploration:    Limited defect created (wound extended): yes     Hemostasis achieved with:  Epinephrine, tourniquet and direct pressure   Wound exploration: wound explored through full range of motion and entire depth of wound probed and visualized     Wound extent: areolar tissue violated, fascia violated, foreign bodies/material and muscle damage     Wound extent: no nerve damage noted, no tendon damage noted, no underlying fracture noted and no vascular damage noted     Contaminated: yes   Treatment:    Area cleansed with:  Soap and water   Amount of cleaning:  Extensive   Irrigation solution:  Tap water   Irrigation method:  Tap   Visualized foreign bodies/material removed: yes     Debridement:  Minimal   Undermining:  Minimal   Scar revision: no   Subcutaneous repair:    Suture size:  4-0   Suture material:  Vicryl   Number of sutures:  2 Skin repair:    Repair method:  Sutures   Suture size:  4-0   Wound skin closure material used: Vicryl.   Suture technique:  Simple interrupted   Number of sutures:  8 Approximation:    Approximation:  Close Post-procedure details:    Dressing:  Antibiotic ointment   [name] tolerance of procedure:  Tolerated well, no immediate complications Comments:     This was a complex wound repair.  He had a significant amount of bleeding secondary to his dual antiplatelet therapy.  I anesthetized his finger with a digital block using 2% lidocaine with epinephrine achieving complete anesthesia.  I then put a tourniquet on Jerry  finger and bled out all of Jerry venous and capillary blood.  I then took Jerry [name] to Jerry sink and washed out his wound with copious warm water and soap for 60 seconds.  I then explored Jerry wound in a bloodless field with good overhead lighting noting no bony involvement but did show a  partial amputation.  I then closed Jerry wound in multiple layers.  First with 2 4-0 deep Vicryl stitches to take Jerry tension off Jerry wound.  I then placed 6 4-0 Vicryl sutures closing Jerry wound after a small amount of undermining and debridement of dead tissue.  I also placed 2 3-0  Vicryl sutures through Jerry [name]'s nail and skin to help close Jerry wound.  Finally I may Dermabond it extensively over Jerry top to help achieve hemostasis.  Following this I removed Jerry tourniquet and he no longer bled.     Critical Care performed: no  ____________________________________________   INITIAL IMPRESSION / ASSESSMENT AND PLAN / ED COURSE  Pertinent labs & imaging results that were available during my care of Jerry [name] were reviewed by me and considered in my medical decision making (see chart for details).   As part of my medical decision making, I reviewed Jerry following data within Jerry [place] History obtained from family if available, nursing notes, old chart and ekg, as well as notes from prior ED visits.  Jerry [name] had a partial amputation to Jerry distal phalanx of his left middle finger and was unable to achieve hemostasis.  Please see procedure note above but I was able to achieve hemostasis after applying a tourniquet and closing Jerry wound in multiple layers.  Following closure I Dermabond it over Jerry top and took down Jerry tourniquet and Jerry [name] remained hemostatic.  [place] updated.  Discharged home with 2-day wound check.  Strict return precautions have been given.      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     Darel Hong, MD 02/24/18 [phone]

## 2018-02-21 NOTE — ED Triage Notes (Signed)
Pt was cutting sweet potatoes when he cut left 3rd finger. Small lac noted to tip of finger but pt states he is not able to control bleeding.

## 2018-03-12 ENCOUNTER — Telehealth: Payer: Self-pay | Admitting: Urology

## 2018-04-03 ENCOUNTER — Telehealth: Payer: Self-pay | Admitting: Urology

## 2018-04-03 DIAGNOSIS — N402 Nodular prostate without lower urinary tract symptoms: Secondary | ICD-10-CM

## 2018-04-03 DIAGNOSIS — R935 Abnormal findings on diagnostic imaging of other abdominal regions, including retroperitoneum: Secondary | ICD-10-CM

## 2018-04-04 NOTE — Telephone Encounter (Signed)
Patient needs to have a fusion BX but before he can be scheduled we need a clearance from Dr. Bunnie Domino office for him to stop his Plavix's. Can someone get that started please? They can't schedule him until this has been done.   Thanks, Sharyn Lull

## 2018-04-04 NOTE — Telephone Encounter (Signed)
Clearance note faxed

## 2018-04-10 NOTE — Telephone Encounter (Signed)
Is patient able to have Fusion BX done.  He is currently taking aspirin and plavix.  Please advise when he should stop and how long if allowed.

## 2018-04-10 NOTE — Telephone Encounter (Signed)
Forwarded message to Dr Lucky Cowboy.

## 2018-04-10 NOTE — Telephone Encounter (Signed)
Can someone check on the clearance note from Dr. Bunnie Domino office? Tammy @ [facility] is still waiting on this before she can get him scheduled for his Fusion BX   Thanks, [employer]

## 2018-04-11 NOTE — Telephone Encounter (Signed)
This was faxed earlier this week and again this morning.

## 2018-04-15 ENCOUNTER — Telehealth: Payer: Self-pay | Admitting: Urology

## 2018-04-15 NOTE — Telephone Encounter (Signed)
Is Mr. Fann scheduled for his fusion bx yet?

## 2018-04-15 NOTE — Telephone Encounter (Signed)
We were waiting on the cardiac clearance from Dr. Bunnie Domino office and we just got that on 04-11-18. So they should be getting him scheduled soon.   Sharyn Lull

## 2018-05-07 ENCOUNTER — Other Ambulatory Visit: Payer: Self-pay | Admitting: Urology

## 2018-05-10 ENCOUNTER — Encounter (INDEPENDENT_AMBULATORY_CARE_PROVIDER_SITE_OTHER): Payer: Self-pay | Admitting: Vascular Surgery

## 2018-05-10 ENCOUNTER — Ambulatory Visit (INDEPENDENT_AMBULATORY_CARE_PROVIDER_SITE_OTHER): Payer: Medicare HMO | Admitting: Vascular Surgery

## 2018-05-10 ENCOUNTER — Ambulatory Visit (INDEPENDENT_AMBULATORY_CARE_PROVIDER_SITE_OTHER): Payer: Medicare HMO

## 2018-05-10 VITALS — BP 152/78 | HR 79 | Resp 12 | Ht 65.5 in | Wt 130.4 lb

## 2018-05-10 DIAGNOSIS — F17211 Nicotine dependence, cigarettes, in remission: Secondary | ICD-10-CM | POA: Diagnosis not present

## 2018-05-10 DIAGNOSIS — I70219 Atherosclerosis of native arteries of extremities with intermittent claudication, unspecified extremity: Secondary | ICD-10-CM

## 2018-05-10 DIAGNOSIS — E785 Hyperlipidemia, unspecified: Secondary | ICD-10-CM

## 2018-05-10 NOTE — Progress Notes (Signed)
MRN : [id number]  Jerry [name] is a 73 y.o. (1945-12-10) male who presents with chief complaint of  Chief Complaint  Patient presents with  . Follow-up  .  History of Present Illness: Patient returns today in follow up of PAD. He is doing well.  He denies lifestyle limiting claudication, rest pain , or ulceration. He had studies today showing a right ABI of 0.71 and a left ABI of 0.86.  His pressures are down a little bit from his last visit, but not profoundly so and he remains reasonably asymptomatic.  Current Outpatient Medications  Medication Sig Dispense Refill  . acetaminophen (TYLENOL) 325 MG tablet Take 650 mg by mouth every 6 (six) hours as needed for moderate pain.    Marland Kitchen aspirin EC 81 MG tablet Take 81 mg by mouth daily.    Marland Kitchen atorvastatin (LIPITOR) 10 MG tablet Take 1 tablet (10 mg total) by mouth daily. 30 tablet 11  . cholecalciferol (VITAMIN D) 1000 units tablet Take 1,000 Units by mouth daily.    . clopidogrel (PLAVIX) 75 MG tablet Take 1 tablet (75 mg total) by mouth daily. 30 tablet 5  . ibuprofen (ADVIL,MOTRIN) 600 MG tablet Take 1 tablet (600 mg total) by mouth every 8 (eight) hours as needed. 15 tablet 0  . Multiple Vitamin (MULTIVITAMIN) capsule Take 1 capsule by mouth as needed.      No current facility-administered medications for this visit.     Past Medical History:  Diagnosis Date  . Anemia    patient unaware of this diagnosis  . Cigarette smoker   . Hearing loss   . Hyperlipidemia   . Leg DVT (deep venous thromboembolism), acute, left ([place])   . Peripheral vascular disease (HCC)    blockages in both extremities  . Vitamin D deficiency    patient unaware of this diagnosis    Past Surgical History:  Procedure Laterality Date  . ANGIOPLASTY ILLIAC ARTERY Left 10/03/2017   Procedure: ANGIOPLASTY ILIAC ARTERY;  Surgeon: Algernon Huxley, MD;  Location: [facility];  Service: Vascular;  Laterality: Left;  . BACK SURGERY  1990   Three sx in lower back. metal in  lower back  . COLONOSCOPY  01/2010  . ENDARTERECTOMY FEMORAL Left 10/03/2017   Procedure: ENDARTERECTOMY FEMORAL;  Surgeon: Algernon Huxley, MD;  Location: [facility] ORS;  Service: Vascular;  Laterality: Left;  . Left Leg stent Left 2009   fem fem bypass  . LOWER EXTREMITY ANGIOGRAPHY Left 09/19/2017   Procedure: LOWER EXTREMITY ANGIOGRAPHY;  Surgeon: Algernon Huxley, MD;  Location: [place] [facility];  Service: Cardiovascular;  Laterality: Left;  . LOWER EXTREMITY ANGIOGRAPHY Right 01/07/2018   Procedure: LOWER EXTREMITY ANGIOGRAPHY;  Surgeon: Algernon Huxley, MD;  Location: [place] [facility];  Service: Cardiovascular;  Laterality: Right;  . PTCA Right 08/2017  . VASCULAR SURGERY Left 2009   fem fem bypass    Social History        Tobacco Use  . Smoking status: Former Smoker    Packs/day: 1.00    Years: 40.00    Pack years: 40.00    Types: Cigarettes    Last attempt to quit: 12/08/2017    Years since quitting: 0.1  . Smokeless tobacco: Never Used  . Tobacco comment: pt states smoke 3 cigarettes a day  Substance Use Topics  . Alcohol use: Never    Frequency: Never  . Drug use: Never     Family History  Family History  Problem Relation Age of Onset  . Colon cancer Neg Hx     No Known Allergies   REVIEW OF SYSTEMS (Negative unless checked)  Constitutional: [] ?Weight loss  [] ?Fever  [] ?Chills Cardiac: [] ?Chest pain   [] ?Chest pressure   [] ?Palpitations   [] ?Shortness of breath when laying flat   [] ?Shortness of breath at rest   [] ?Shortness of breath with exertion. Vascular:  [x] ?Pain in legs with walking   [] ?Pain in legs at rest   [] ?Pain in legs when laying flat   [x] ?Claudication   [] ?Pain in feet when walking  [] ?Pain in feet at rest  [] ?Pain in feet when laying flat   [] ?History of DVT   [] ?Phlebitis   [] ?Swelling in legs   [] ?Varicose veins   [] ?Non-healing ulcers Pulmonary:   [] ?Uses home oxygen   [] ?Productive cough   [] ?Hemoptysis   [] ?Wheeze   [] ?COPD   [] ?Asthma Neurologic:  [] ?Dizziness  [] ?Blackouts   [] ?Seizures   [] ?History of stroke   [] ?History of TIA  [] ?Aphasia   [] ?Temporary blindness   [] ?Dysphagia   [] ?Weakness or numbness in arms   [] ?Weakness or numbness in legs Musculoskeletal:  [] ?Arthritis   [] ?Joint swelling   [] ?Joint pain   [] ?Low back pain Hematologic:  [] ?Easy bruising  [] ?Easy bleeding   [] ?Hypercoagulable state   [] ?Anemic   Gastrointestinal:  [] ?Blood in stool   [] ?Vomiting blood  [] ?Gastroesophageal reflux/heartburn   [] ?Abdominal pain Genitourinary:  [] ?Chronic kidney disease   [] ?Difficult urination  [] ?Frequent urination  [] ?Burning with urination   [] ?Hematuria Skin:  [] ?Rashes   [] ?Ulcers   [] ?Wounds Psychological:  [] ?History of anxiety   [] ? History of major depression.    Physical Examination  BP (!) 152/78 (BP Location: Right Arm, Patient Position: Sitting)   Pulse 79   Resp 12   Ht 5' 5.5" (1.664 m)   Wt 130 lb 6.4 oz (59.1 kg)   BMI 21.37 kg/m  Gen:  WD/WN, NAD Head: [place]/AT, No temporalis wasting. Ear/Nose/Throat: Hearing grossly intact, nares w/o erythema or drainage Eyes: Conjunctiva clear. Sclera non-icteric Neck: Supple.  Trachea midline Pulmonary:  Good air movement, no use of accessory muscles.  Cardiac: RRR, no JVD Vascular:  Vessel Right Left  Radial Palpable Palpable                          PT 1+ Palpable Palpable  DP 1+ Palpable Palpable    Musculoskeletal: M/S 5/5 throughout.  No deformity or atrophy. No edema. Neurologic: Sensation grossly intact in extremities.  Symmetrical.  Speech is fluent.  Psychiatric: Judgment intact, Mood & affect appropriate for pt's clinical situation. Dermatologic: No rashes or ulcers noted.  No cellulitis or open wounds.       Labs No results found for this or any previous visit (from the past 2160 hour(s)).  Radiology No results found.  Assessment/Plan Hyperlipidemia lipid control important in reducing the progression  of atherosclerotic disease. Continue statin therapy  Atherosclerosis of lower extremity with claudication ([place]) He had studies today showing a right ABI of 0.71 and a left ABI of 0.86.  His pressures are down a little bit from his last visit, but not profoundly so and he remains reasonably asymptomatic. At current, no role for intervention.  Continue current medical regimen.  Recheck in 6 months    [name], MD  05/10/2018 9:55 AM    This note was created with Dragon medical transcription system.  Any errors from dictation are  purely unintentional

## 2018-05-10 NOTE — Patient Instructions (Signed)
Peripheral Vascular Disease  Peripheral vascular disease (PVD) is a disease of the blood vessels that are not part of your heart and brain. A simple term for PVD is poor circulation. In most cases, PVD narrows the blood vessels that carry blood from your heart to the rest of your body. This can reduce the supply of blood to your arms, legs, and internal organs, like your stomach or kidneys. However, PVD most often affects a person's lower legs and feet. Without treatment, PVD tends to get worse. PVD can also lead to acute ischemic limb. This is when an arm or leg suddenly cannot get enough blood. This is a medical emergency. Follow these instructions at home: Lifestyle  Do not use any products that contain nicotine or tobacco, such as cigarettes and e-cigarettes. If you need help quitting, ask your doctor.  Lose weight if you are overweight. Or, stay at a healthy weight as told by your doctor.  Eat a diet that is low in fat and cholesterol. If you need help, ask your doctor.  Exercise regularly. Ask your doctor for activities that are right for you. General instructions  Take over-the-counter and prescription medicines only as told by your doctor.  Take good care of your feet: ? Wear comfortable shoes that fit well. ? Check your feet often for any cuts or sores.  Keep all follow-up visits as told by your doctor This is important. Contact a doctor if:  You have cramps in your legs when you walk.  You have leg pain when you are at rest.  You have coldness in a leg or foot.  Your skin changes.  You are unable to get or have an erection (erectile dysfunction).  You have cuts or sores on your feet that do not heal. Get help right away if:  Your arm or leg turns cold, numb, and blue.  Your arms or legs become red, warm, swollen, painful, or numb.  You have chest pain.  You have trouble breathing.  You suddenly have weakness in your face, arm, or leg.  You become very  confused or you cannot speak.  You suddenly have a very bad headache.  You suddenly cannot see. Summary  Peripheral vascular disease (PVD) is a disease of the blood vessels.  A simple term for PVD is poor circulation. Without treatment, PVD tends to get worse.  Treatment may include exercise, low fat and low cholesterol diet, and quitting smoking. This information is not intended to replace advice given to you by your health care provider. Make sure you discuss any questions you have with your health care provider. Document Released: 06/07/2009 Document Revised: 04/20/2016 Document Reviewed: 04/20/2016 Elsevier Interactive Patient Education  2019 Elsevier Inc.  

## 2018-05-10 NOTE — Assessment & Plan Note (Signed)
He had studies today showing a right ABI of 0.71 and a left ABI of 0.86.  His pressures are down a little bit from his last visit, but not profoundly so and he remains reasonably asymptomatic. At current, no role for intervention.  Continue current medical regimen.  Recheck in 6 months

## 2018-05-17 ENCOUNTER — Ambulatory Visit: Payer: Medicare HMO | Admitting: Urology

## 2018-05-17 ENCOUNTER — Encounter: Payer: Self-pay | Admitting: Urology

## 2018-05-20 ENCOUNTER — Other Ambulatory Visit: Payer: Self-pay | Admitting: Urology

## 2018-05-20 ENCOUNTER — Telehealth: Payer: Self-pay | Admitting: Urology

## 2018-05-20 DIAGNOSIS — C61 Malignant neoplasm of prostate: Secondary | ICD-10-CM

## 2018-05-20 NOTE — Telephone Encounter (Signed)
No he no showed   Jerry [name]

## 2018-05-20 NOTE — Telephone Encounter (Signed)
Did Mr. Delauder have his appointment with Dr. Bernardo Heater on Friday?

## 2018-05-24 ENCOUNTER — Encounter: Payer: Self-pay | Admitting: Urology

## 2018-05-24 ENCOUNTER — Ambulatory Visit: Payer: Medicare HMO | Admitting: Urology

## 2018-05-24 NOTE — Telephone Encounter (Signed)
Pt was a no show today also.

## 2018-06-07 ENCOUNTER — Other Ambulatory Visit (INDEPENDENT_AMBULATORY_CARE_PROVIDER_SITE_OTHER): Payer: Self-pay | Admitting: Vascular Surgery

## 2018-06-14 ENCOUNTER — Ambulatory Visit: Payer: Medicare HMO | Admitting: Urology

## 2018-06-17 NOTE — Telephone Encounter (Signed)
Pt's wife called office asking about Prostate MRI results.  He no showed twice and canceled once.  I wasn't sure if this was something he could be given over the phone or if he needed to schedule appt.  Pt doesn't have a DPR on file.

## 2018-06-18 NOTE — Telephone Encounter (Signed)
Check behind me, but I believe Dr. Bernardo Heater stated he would discuss the results over the phone with the patient.  He prostate biopsy results are positive.

## 2018-06-18 NOTE — Telephone Encounter (Signed)
Will you call this patient with biopsy results?

## 2018-06-20 ENCOUNTER — Telehealth: Payer: Self-pay | Admitting: Urology

## 2018-06-20 NOTE — Telephone Encounter (Signed)
Called on 06/19/2018 and left voicemail  Called back today and spoke to wife.  He was not at home but there is not a DPI scanned in the patient's chart and informed her I will call back.

## 2018-06-20 NOTE — Telephone Encounter (Signed)
I called the patient earlier.  Refer to prior telephone note.  He was not at home and there is not a DPI in the chart.

## 2018-06-20 NOTE — Telephone Encounter (Signed)
Pt's wife called and states that she would like for someone to call and discuss pt's Biopsy results. Please advise.

## 2018-06-25 NOTE — Telephone Encounter (Signed)
Mr. Iten underwent transrectal ultrasound and fusion biopsy in [facility] on 05/02/2018.  He had a left prostate nodule and a PI-RADS 5 lesion on MRI.  He was a no-show for his biopsy follow-up on 3 occasions and asked to be called with the report.  He had no post biopsy problems.  Prostate volume was calculated at 35 cc.  Pathology: The MR region of interest was positive for Gleason 4+3 and 2 cores.  He also had positive biopsies at the left lateral base and left lateral mid prostate and Gleason 3+4 and 4+3 respectively.  The left mid prostate also showed Gleason 4+3 and 1 core.  The pathology report was discussed with Mr. Devita.  He is intermediate risk unfavorable by NCCN guidelines and I recommended treatment.  He does have significant medical comorbidities and feel radiation would be his best option.  Radical prostatectomy was discussed briefly.  He did not have a DPI in the chart and asked that I call his wife and inform her of the results.  She was also contacted and all questions were answered.  His MRI showed no evidence of extracapsular extension, seminal vesicle invasion or pelvic lymphadenopathy.  Based on his PSA bone imaging is not recommended.  A referral was placed to radiation oncology.

## 2018-07-01 ENCOUNTER — Other Ambulatory Visit: Payer: Self-pay

## 2018-07-01 ENCOUNTER — Encounter: Payer: Self-pay | Admitting: Radiation Oncology

## 2018-07-01 ENCOUNTER — Ambulatory Visit
Admission: RE | Admit: 2018-07-01 | Discharge: 2018-07-01 | Disposition: A | Discharge status: Home / Self Care | Payer: Medicare HMO | Source: Ambulatory Visit | Attending: Radiation Oncology | Admitting: Radiation Oncology

## 2018-07-01 ENCOUNTER — Telehealth: Payer: Self-pay | Admitting: Urology

## 2018-07-01 ENCOUNTER — Encounter (INDEPENDENT_AMBULATORY_CARE_PROVIDER_SITE_OTHER): Payer: Self-pay

## 2018-07-01 VITALS — BP 129/70 | HR 73 | Temp 96.1°F | Resp 16 | Wt 125.7 lb

## 2018-07-01 DIAGNOSIS — I739 Peripheral vascular disease, unspecified: Secondary | ICD-10-CM | POA: Insufficient documentation

## 2018-07-01 DIAGNOSIS — C61 Malignant neoplasm of prostate: Secondary | ICD-10-CM

## 2018-07-01 DIAGNOSIS — N400 Enlarged prostate without lower urinary tract symptoms: Secondary | ICD-10-CM | POA: Diagnosis not present

## 2018-07-01 DIAGNOSIS — Z79899 Other long term (current) drug therapy: Secondary | ICD-10-CM | POA: Diagnosis not present

## 2018-07-01 DIAGNOSIS — D649 Anemia, unspecified: Secondary | ICD-10-CM | POA: Diagnosis not present

## 2018-07-01 DIAGNOSIS — F17210 Nicotine dependence, cigarettes, uncomplicated: Secondary | ICD-10-CM | POA: Insufficient documentation

## 2018-07-01 DIAGNOSIS — R351 Nocturia: Secondary | ICD-10-CM | POA: Diagnosis not present

## 2018-07-01 DIAGNOSIS — E785 Hyperlipidemia, unspecified: Secondary | ICD-10-CM | POA: Insufficient documentation

## 2018-07-01 DIAGNOSIS — R350 Frequency of micturition: Secondary | ICD-10-CM | POA: Diagnosis not present

## 2018-07-01 DIAGNOSIS — Z86718 Personal history of other venous thrombosis and embolism: Secondary | ICD-10-CM | POA: Insufficient documentation

## 2018-07-01 DIAGNOSIS — Z7982 Long term (current) use of aspirin: Secondary | ICD-10-CM | POA: Diagnosis not present

## 2018-07-01 NOTE — Telephone Encounter (Signed)
Working on the [employer]

## 2018-07-01 NOTE — Consult Note (Signed)
NEW PATIENT EVALUATION  Name: Jerry [name]  MRN: [id number]  Date:   07/01/2018     DOB: 08/02/1945   This 73 y.o. 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  REFERRING PHYSICIAN: Marguerita Merles, MD  CHIEF COMPLAINT:  Chief Complaint  Patient presents with  . Prostate Cancer    Initial consultation prostate    DIAGNOSIS: The encounter diagnosis was Malignant neoplasm of prostate ([place]).   PREVIOUS INVESTIGATIONS:  Pathology report reviewed Clinical notes reviewed MRI scan reviewed of prostate   HPI: Patient is a 73 year old male presented with an abnormal rectal exam showing left-sided prostate nodule.  He underwent a PET CT scan showing a 1.2 cm signal abnormality in the left mid gland of the peripheral zone consistent with high-grade carcinoma.  Trans-capsular spread was equivocal.  He underwent transrectal ultrasound-guided fusion biopsy in [place] on February 6 and had a biopsy-proven Gleason 7 (4+3) adenocarcinoma the prostate.  4 of 8 cores on the left side of the prostate were mostly Gleason 7 (4+3).  His PSA was in the 3.7 range.  Patient does have some urgency and frequency of urination and nocturia x3-4.  Patient has been seen by urology and based on his stage of disease multiple comorbidities including peripheral vascular disease history of anemia was not thought to be a surgical candidate.  He is now referred to radiation oncology for opinion.  As outlined above he does have some urgency and frequency of urination.  He also has erectile dysfunction.  PLANNED TREATMENT REGIMEN: IMRT radiation therapy  PAST MEDICAL HISTORY:  has a past medical history of Anemia, Cigarette smoker, Hearing loss, Hyperlipidemia, Leg DVT (deep venous thromboembolism), acute, left (HCC), Peripheral vascular disease ([place]), and Vitamin D deficiency.    PAST SURGICAL HISTORY:  Past  Surgical History:  Procedure Laterality Date  . ANGIOPLASTY ILLIAC ARTERY Left 10/03/2017   Procedure: ANGIOPLASTY ILIAC ARTERY;  Surgeon: Algernon Huxley, MD;  Location: [facility];  Service: Vascular;  Laterality: Left;  . BACK SURGERY  1990   Three sx in lower back. metal in lower back  . COLONOSCOPY  01/2010  . ENDARTERECTOMY FEMORAL Left 10/03/2017   Procedure: ENDARTERECTOMY FEMORAL;  Surgeon: Algernon Huxley, MD;  Location: [facility] ORS;  Service: Vascular;  Laterality: Left;  . Left Leg stent Left 2009   fem fem bypass  . LOWER EXTREMITY ANGIOGRAPHY Left 09/19/2017   Procedure: LOWER EXTREMITY ANGIOGRAPHY;  Surgeon: Algernon Huxley, MD;  Location: [place] [facility];  Service: Cardiovascular;  Laterality: Left;  . LOWER EXTREMITY ANGIOGRAPHY Right 01/07/2018   Procedure: LOWER EXTREMITY ANGIOGRAPHY;  Surgeon: Algernon Huxley, MD;  Location: [place] [facility];  Service: Cardiovascular;  Laterality: Right;  . PTCA Right 08/2017  . VASCULAR SURGERY Left 2009   fem fem bypass     FAMILY HISTORY: family history is not on file.  SOCIAL HISTORY:  reports that he quit smoking about 6 months ago. His smoking use included cigarettes. He has a 40.00 pack-year smoking history. He has never used smokeless tobacco. He reports that he does not drink alcohol or use drugs.  ALLERGIES: Patient has no known allergies.  MEDICATIONS:  Current Outpatient Medications  Medication Sig Dispense Refill  . acetaminophen (TYLENOL) 325 MG tablet Take 650 mg by mouth every 6 (six) hours as needed for moderate pain.    Marland Kitchen aspirin  EC 81 MG tablet Take 81 mg by mouth daily.    Marland Kitchen atorvastatin (LIPITOR) 10 MG tablet TAKE 1 TABLET EVERY DAY 90 tablet 6  . cholecalciferol (VITAMIN D) 1000 units tablet Take 1,000 Units by mouth daily.    . clopidogrel (PLAVIX) 75 MG tablet TAKE 1 TABLET EVERY DAY 90 tablet 6  . ibuprofen (ADVIL,MOTRIN) 600 MG tablet Take 1 tablet (600 mg total) by mouth every 8 (eight) hours as needed. 15 tablet  0  . Multiple Vitamin (MULTIVITAMIN) capsule Take 1 capsule by mouth as needed.      No current facility-administered medications for this encounter.     ECOG PERFORMANCE STATUS:  1 - Symptomatic but completely ambulatory  REVIEW OF SYSTEMS: Patient denies any weight loss, fatigue, weakness, fever, chills or night sweats. Patient denies any loss of vision, blurred vision. Patient denies any ringing  of the ears or hearing loss. No irregular heartbeat. Patient denies heart murmur or history of fainting. Patient denies any chest pain or pain radiating to her upper extremities. Patient denies any shortness of breath, difficulty breathing at night, cough or hemoptysis. Patient denies any swelling in the lower legs. Patient denies any nausea vomiting, vomiting of blood, or coffee ground material in the vomitus. Patient denies any stomach pain. Patient states has had normal bowel movements no significant constipation or diarrhea. Patient denies any dysuria, hematuria or significant nocturia. Patient denies any problems walking, swelling in the joints or loss of balance. Patient denies any skin changes, loss of hair or loss of weight. Patient denies any excessive worrying or anxiety or significant depression. Patient denies any problems with insomnia. Patient denies excessive thirst, polyuria, polydipsia. Patient denies any swollen glands, patient denies easy bruising or easy bleeding. Patient denies any recent infections, allergies or URI. Patient "s visual fields have not changed significantly in recent time.   PHYSICAL EXAM: BP 129/70   Pulse 73   Temp (!) 96.1 F (35.6 C)   Resp 16   Wt 125 lb 10.6 oz (57 kg)   BMI 20.59 kg/m  On rectal exam rectal sphincter tone is good prostate is nodule which does obscure the sulcus on the left side.  Remainder of gland is normal.  No other rectal abnormality is identified.  Well-developed well-nourished patient in NAD. HEENT reveals PERLA, EOMI, discs not  visualized.  Oral cavity is clear. No oral mucosal lesions are identified. Neck is clear without evidence of cervical or supraclavicular adenopathy. Lungs are clear to A&P. Cardiac examination is essentially unremarkable with regular rate and rhythm without murmur rub or thrill. Abdomen is benign with no organomegaly or masses noted. Motor sensory and DTR levels are equal and symmetric in the upper and lower extremities. Cranial nerves II through XII are grossly intact. Proprioception is intact. No peripheral adenopathy or edema is identified. No motor or sensory levels are noted. Crude visual fields are within normal range.  LABORATORY DATA: Pathology report reviewed    RADIOLOGY RESULTS: MRI of prostate reviewed   IMPRESSION: Stage IIb adenocarcinoma the prostate in a 73 year old male presenting with a PSA of 3.7 Gleason 7 (4+3)  PLAN: At this time I have recommended external beam radiation therapy using IMRT treatment planning and delivery.  I would like to dose pain his prostate nodule at 8400 cGy and will treat the remainder of the gland to 8000 cGy.  I have run the [facility] nomogram showing only a 8% chance of lymph node involvement with a 55% chance of  extracapsular extension.  Do not see a need to treat his pelvic lymph nodes at this time.  I am also asking Dr. Dene Gentry office to start Lupron therapy and would keep him suppressed for a year and 1/2 to 2 years.  Patient comprehends my treatment plan well.  I personally set up and ordered CT simulation for early next week.  Patient comprehends my treatment plan well.  I have also spoken by phone to his wife and explained my treatment plan to her.  I would like to take this opportunity to thank you for allowing me to participate in the care of your patient.Marland Kitchen

## 2018-07-01 NOTE — Telephone Encounter (Signed)
-----   Message from Daiva Huge, RN sent at 07/01/2018  9:31 AM EDT ----- Regarding: Lupron Mr. Kevontay Burks will need to be set up for Lupron injections.   Thanks,   [facility]

## 2018-07-02 ENCOUNTER — Telehealth: Payer: Self-pay | Admitting: Urology

## 2018-07-02 NOTE — Telephone Encounter (Signed)
-----   Message from Royanne Foots, [place] sent at 07/02/2018 10:09 AM EDT ----- Regarding: RE: Lupron Crystal's not isn't finished but It looks like itll be his first one maybe bring him in to see shannon? ----- Message ----- From: Benard Halsted Sent: 07/01/2018  11:30 AM EDT To: Royanne Foots, CMA Subject: FW: Lupron                                     Can you look at this and let me know? I will work on the [employer], [employer] ----- Message ----- From: Daiva Huge, RN Sent: 07/01/2018   9:31 AM EDT To: Benard Halsted Subject: Lupron                                         [name] will need to be set up for Lupron injections.   Thanks,   [facility]

## 2018-07-02 NOTE — Telephone Encounter (Signed)
Waiting on PA for Lupron  [facility]

## 2018-07-05 ENCOUNTER — Other Ambulatory Visit: Payer: Self-pay

## 2018-07-08 ENCOUNTER — Ambulatory Visit
Admission: RE | Admit: 2018-07-08 | Discharge: 2018-07-08 | Disposition: A | Discharge status: Home / Self Care | Payer: Medicare HMO | Source: Ambulatory Visit | Attending: Radiation Oncology | Admitting: Radiation Oncology

## 2018-07-08 ENCOUNTER — Other Ambulatory Visit: Payer: Self-pay

## 2018-07-08 DIAGNOSIS — Z7902 Long term (current) use of antithrombotics/antiplatelets: Secondary | ICD-10-CM | POA: Diagnosis not present

## 2018-07-08 DIAGNOSIS — Z87891 Personal history of nicotine dependence: Secondary | ICD-10-CM | POA: Diagnosis not present

## 2018-07-08 DIAGNOSIS — E559 Vitamin D deficiency, unspecified: Secondary | ICD-10-CM | POA: Insufficient documentation

## 2018-07-08 DIAGNOSIS — Z510 Encounter for antineoplastic radiation therapy: Secondary | ICD-10-CM | POA: Diagnosis present

## 2018-07-08 DIAGNOSIS — E785 Hyperlipidemia, unspecified: Secondary | ICD-10-CM | POA: Diagnosis not present

## 2018-07-08 DIAGNOSIS — C61 Malignant neoplasm of prostate: Secondary | ICD-10-CM | POA: Insufficient documentation

## 2018-07-08 DIAGNOSIS — I739 Peripheral vascular disease, unspecified: Secondary | ICD-10-CM | POA: Diagnosis not present

## 2018-07-08 DIAGNOSIS — Z86718 Personal history of other venous thrombosis and embolism: Secondary | ICD-10-CM | POA: Diagnosis not present

## 2018-07-09 NOTE — Telephone Encounter (Signed)
Spoke with Tiffany @ [employer] and NO PA is required for the Lupron ref# [id number] 07-09-18 however the patient's insurance does require a referral for the injection from his PCP [name]. I called and spoke with the referral's coordinator and she is requesting the referral and will contact the office back with questions. If we do not obtain this referral they will deny the injection, so until I get this we can't schedule him.  I will keep you posted.   Sharyn Lull

## 2018-07-11 DIAGNOSIS — Z510 Encounter for antineoplastic radiation therapy: Secondary | ICD-10-CM | POA: Diagnosis not present

## 2018-07-11 NOTE — Telephone Encounter (Signed)
Spoke w/Beena from [employer] did verbal PA over the phone for 6 month Lupron. Notes and labs faxed to [phone] Josem Kaufmann #[id number] start date 07-15-18 and expires on 10-13-2018

## 2018-07-12 ENCOUNTER — Other Ambulatory Visit: Payer: Self-pay | Admitting: *Deleted

## 2018-07-12 DIAGNOSIS — C61 Malignant neoplasm of prostate: Secondary | ICD-10-CM

## 2018-07-12 NOTE — Telephone Encounter (Signed)
PA FOR [place] [id number] 07-15-18 10-13-18

## 2018-07-15 ENCOUNTER — Encounter: Payer: Self-pay | Admitting: Urology

## 2018-07-15 ENCOUNTER — Ambulatory Visit (INDEPENDENT_AMBULATORY_CARE_PROVIDER_SITE_OTHER): Payer: Medicare HMO | Admitting: Urology

## 2018-07-15 ENCOUNTER — Other Ambulatory Visit: Payer: Self-pay

## 2018-07-15 VITALS — BP 145/78 | HR 76 | Ht 65.0 in | Wt 120.0 lb

## 2018-07-15 DIAGNOSIS — C61 Malignant neoplasm of prostate: Secondary | ICD-10-CM | POA: Diagnosis not present

## 2018-07-15 MED ORDER — LEUPROLIDE ACETATE (6 MONTH) 45 MG IM KIT
45.0000 mg | PACK | Freq: Once | INTRAMUSCULAR | Status: AC
  Administered 2018-07-15: 45 mg via INTRAMUSCULAR

## 2018-07-15 NOTE — Patient Instructions (Addendum)
** BEGIN TAKING VITAMIN D and CALCIUM **  Calcium 600 mg twice daily Vitamin D 200 IU twice daily   Leuprolide injection What is this medicine? LEUPROLIDE (loo PROE lide) is a man-made hormone. It is used to treat the symptoms of prostate cancer. This medicine may also be used to treat children with early onset of puberty. It may be used for other hormonal conditions. This medicine may be used for other purposes; ask your health care provider or pharmacist if you have questions. COMMON BRAND NAME(S): Lupron What should I tell my health care provider before I take this medicine? They need to know if you have any of these conditions: -diabetes -heart disease or previous heart attack -high blood pressure -high cholesterol -pain or difficulty passing urine -spinal cord metastasis -stroke -tobacco smoker -an unusual or allergic reaction to leuprolide, benzyl alcohol, other medicines, foods, dyes, or preservatives -pregnant or trying to get pregnant -breast-feeding How should I use this medicine? This medicine is for injection under the skin or into a muscle. You will be taught how to prepare and give this medicine. Use exactly as directed. Take your medicine at regular intervals. Do not take your medicine more often than directed. It is important that you put your used needles and syringes in a special sharps container. Do not put them in a trash can. If you do not have a sharps container, call your pharmacist or healthcare provider to get one. A special MedGuide will be given to you by the pharmacist with each prescription and refill. Be sure to read this information carefully each time. Talk to your pediatrician regarding the use of this medicine in children. While this medicine may be prescribed for children as young as 8 years for selected conditions, precautions do apply. Overdosage: If you think you have taken too much of this medicine contact a poison control center or emergency room at  once. NOTE: This medicine is only for you. Do not share this medicine with others. What if I miss a dose? If you miss a dose, take it as soon as you can. If it is almost time for your next dose, take only that dose. Do not take double or extra doses. What may interact with this medicine? Do not take this medicine with any of the following medications: -chasteberry This medicine may also interact with the following medications: -herbal or dietary supplements, like black cohosh or DHEA -male hormones, like estrogens or progestins and birth control pills, patches, rings, or injections -male hormones, like testosterone This list may not describe all possible interactions. Give your health care provider a list of all the medicines, herbs, non-prescription drugs, or dietary supplements you use. Also tell them if you smoke, drink alcohol, or use illegal drugs. Some items may interact with your medicine. What should I watch for while using this medicine? Visit your doctor or health care professional for regular checks on your progress. During the first week, your symptoms may get worse, but then will improve as you continue your treatment. You may get hot flashes, increased bone pain, increased difficulty passing urine, or an aggravation of nerve symptoms. Discuss these effects with your doctor or health care professional, some of them may improve with continued use of this medicine. Male patients may experience a menstrual cycle or spotting during the first 2 months of therapy with this medicine. If this continues, contact your doctor or health care professional. What side effects may I notice from receiving this medicine? Side effects  that you should report to your doctor or health care professional as soon as possible: -allergic reactions like skin rash, itching or hives, swelling of the face, lips, or tongue -breathing problems -chest pain -depression or memory disorders -pain in your legs or  groin -pain at site where injected -severe headache -swelling of the feet and legs -visual changes -vomiting Side effects that usually do not require medical attention (report to your doctor or health care professional if they continue or are bothersome): -breast swelling or tenderness -decrease in sex drive or performance -diarrhea -hot flashes -loss of appetite -muscle, joint, or bone pains -nausea -redness or irritation at site where injected -skin problems or acne This list may not describe all possible side effects. Call your doctor for medical advice about side effects. You may report side effects to FDA at 1-800-FDA-1088. Where should I keep my medicine? Keep out of the reach of children. Store below 25 degrees C (77 degrees F). Do not freeze. Protect from light. Do not use if it is not clear or if there are particles present. Throw away any unused medicine after the expiration date. NOTE: This sheet is a summary. It may not cover all possible information. If you have questions about this medicine, talk to your doctor, pharmacist, or health care provider.  2019 Elsevier/Gold Standard (2015-11-01 10:54:35)

## 2018-07-15 NOTE — Progress Notes (Signed)
07/15/2018 1:47 PM   Jerry [name] [birth year] [id number]  Referring provider: Marguerita Merles, [address] [place], [place] [address]  Chief Complaint  Patient presents with  . Prostate Cancer    HPI: Jerry [name] is 73 year old African American male with intermediate risk unfavorable prostate cancer who presents today to initiate androgen deprivation therapy.    Prostate cancer history PE: left prostate nodule, PSA 3.7 MRI: PI-RADS 5 lesion on MRI Fusion bx: The MR region of interest was positive for Gleason 4+3 and 2 cores.  He also had positive biopsies at the left lateral base and left lateral mid prostate and Gleason 3+4 and 4+3 respectively.  The left mid prostate also showed Gleason 4+3 and 1 core.  Today, he has no complaints.  Patient denies any gross hematuria, dysuria or suprapubic/flank pain.  Patient denies any fevers, chills, nausea or vomiting.     PMH: Past Medical History:  Diagnosis Date  . Anemia    patient unaware of this diagnosis  . Cigarette smoker   . Hearing loss   . Hyperlipidemia   . Leg DVT (deep venous thromboembolism), acute, left ([place])   . Peripheral vascular disease (HCC)    blockages in both extremities  . Vitamin D deficiency    patient unaware of this diagnosis    Surgical History: Past Surgical History:  Procedure Laterality Date  . ANGIOPLASTY ILLIAC ARTERY Left 10/03/2017   Procedure: ANGIOPLASTY ILIAC ARTERY;  Surgeon: Algernon Huxley, MD;  Location: [facility] ORS;  Service: Vascular;  Laterality: Left;  . BACK SURGERY  1990   Three sx in lower back. metal in lower back  . COLONOSCOPY  01/2010  . ENDARTERECTOMY FEMORAL Left 10/03/2017   Procedure: ENDARTERECTOMY FEMORAL;  Surgeon: Algernon Huxley, MD;  Location: [facility] ORS;  Service: Vascular;  Laterality: Left;  . Left Leg stent Left 2009   fem fem bypass  . LOWER EXTREMITY ANGIOGRAPHY Left 09/19/2017   Procedure: LOWER EXTREMITY ANGIOGRAPHY;  Surgeon: Algernon Huxley, MD;  Location: [place] [facility];  Service: Cardiovascular;  Laterality: Left;  . LOWER EXTREMITY ANGIOGRAPHY Right 01/07/2018   Procedure: LOWER EXTREMITY ANGIOGRAPHY;  Surgeon: Algernon Huxley, MD;  Location: [place] [facility];  Service: Cardiovascular;  Laterality: Right;  . PTCA Right 08/2017  . VASCULAR SURGERY Left 2009   fem fem bypass     Home Medications:  Allergies as of 07/15/2018   No Known Allergies     Medication List       Accurate as of July 15, 2018  1:47 PM. Always use your most recent med list.        acetaminophen 325 MG tablet Commonly known as:  TYLENOL Take 650 mg by mouth every 6 (six) hours as needed for moderate pain.   aspirin EC 81 MG tablet Take 81 mg by mouth daily.   atorvastatin 10 MG tablet Commonly known as:  LIPITOR TAKE 1 TABLET EVERY DAY   cholecalciferol 1000 units tablet Commonly known as:  VITAMIN D Take 1,000 Units by mouth daily.   clopidogrel 75 MG tablet Commonly known as:  PLAVIX TAKE 1 TABLET EVERY DAY   ibuprofen 600 MG tablet Commonly known as:  ADVIL Take 1 tablet (600 mg total) by mouth every 8 (eight) hours as needed.   multivitamin capsule Take 1 capsule by mouth as needed.       Allergies: No Known Allergies  Family History: Family History  Problem  Relation Age of Onset  . Colon cancer Neg Hx     Social History:  reports that he quit smoking about 7 months ago. His smoking use included cigarettes. He has a 40.00 pack-year smoking history. He has never used smokeless tobacco. He reports that he does not drink alcohol or use drugs.  ROS: UROLOGY Frequent Urination?: No Hard to postpone urination?: No Burning/pain with urination?: No Get up at night to urinate?: No Leakage of urine?: No Urine stream starts and stops?: No Trouble starting stream?: No Do you have to strain to urinate?: No Blood in urine?: No Urinary tract infection?: No Sexually transmitted disease?: No Injury to kidneys or bladder?: No Painful  intercourse?: No Weak stream?: No Erection problems?: No Penile pain?: No  Gastrointestinal Nausea?: No Vomiting?: No Indigestion/heartburn?: No Diarrhea?: No Constipation?: No  Constitutional Fever: No Night sweats?: No Weight loss?: No Fatigue?: No  Skin Skin rash/lesions?: No Itching?: No  Eyes Blurred vision?: No Double vision?: No  Ears/Nose/Throat Sore throat?: No Sinus problems?: No  Hematologic/Lymphatic Swollen glands?: No Easy bruising?: No  Cardiovascular Leg swelling?: No Chest pain?: No  Respiratory Cough?: No Shortness of breath?: No  Endocrine Excessive thirst?: No  Musculoskeletal Back pain?: No Joint pain?: No  Neurological Headaches?: No Dizziness?: No  Psychologic Depression?: No Anxiety?: No  Physical Exam: BP (!) 145/78 (BP Location: Left Arm, Patient Position: Sitting)   Pulse 76   Ht 5\' 5"  (1.651 m)   Wt 120 lb (54.4 kg)   BMI 19.97 kg/m   Constitutional:  Well nourished. Alert and oriented, No acute distress. HEENT: [place] AT, moist mucus membranes.  Trachea midline, no masses. Cardiovascular: No clubbing, cyanosis, or edema. Respiratory: Normal respiratory effort, no increased work of breathing. Neurologic: Grossly intact, no focal deficits, moving all 4 extremities. Psychiatric: Normal mood and affect.  Laboratory Data: Lab Results  Component Value Date   WBC 9.2 10/05/2017   HGB 11.5 (L) 10/05/2017   HCT 32.5 (L) 10/05/2017   MCV 94.9 10/05/2017   PLT 184 10/05/2017    Lab Results  Component Value Date   CREATININE 1.05 01/04/2018    No results found for: PSA  No results found for: TESTOSTERONE  No results found for: HGBA1C  No results found for: TSH  No results found for: CHOL, HDL, CHOLHDL, VLDL, LDLCALC  Lab Results  Component Value Date   AST 27 07/18/2011   Lab Results  Component Value Date   ALT 19 07/18/2011   No components found for: ALKALINEPHOPHATASE No components found for:  BILIRUBINTOTAL  No results found for: ESTRADIOL  Urinalysis    Component Value Date/Time   COLORURINE Amber 07/18/2011 1143   APPEARANCEUR Hazy 07/18/2011 1143   LABSPEC 1.029 07/18/2011 1143   PHURINE 5.0 07/18/2011 1143   GLUCOSEU Negative 07/18/2011 1143   HGBUR 1+ 07/18/2011 1143   BILIRUBINUR Negative 07/18/2011 1143   KETONESUR Negative 07/18/2011 1143   PROTEINUR 30 mg/dL 07/18/2011 1143   NITRITE Negative 07/18/2011 1143   LEUKOCYTESUR Negative 07/18/2011 1143    I have reviewed the labs.    Assessment & Plan:   1. Unfavorable intermediate risk prostate cancer (HCC) - Leuprolide Acetate (6 Month) (LUPRON) injection 45 mg We did discuss the risks of ADT therapy, such as: weight gain, ED, decrease in libido, fatigue, hot flashes, back pain, joint pain, chills, constipation, heart disease, itching where the injection is given and pain where the injection is given.  We also discussed bone health and I  have advised the patient to start Calcium 600 gm twice daily and Vitamin D 200 mg twice daily. - continue ADT for 2 years (started 06/2018)   - Keep appointments for radiation therapy - RTC in 6 months for PSA and 6 month Lupron injection    Return in about 6 months (around 01/14/2019) for 6 months Lupron and PSA .  These notes generated with voice recognition software. I apologize for typographical errors.  Zara Council, PA-C  [place]  [place], [place] [address] [phone]  I spent 15 minutes with this patient in a face to face visit of which greater than 50% was spent in counseling and coordination of care with the patient.

## 2018-07-16 ENCOUNTER — Other Ambulatory Visit: Payer: Self-pay

## 2018-07-16 ENCOUNTER — Ambulatory Visit: Payer: Medicare HMO

## 2018-07-17 ENCOUNTER — Ambulatory Visit
Admission: RE | Admit: 2018-07-17 | Discharge: 2018-07-17 | Disposition: A | Discharge status: Home / Self Care | Payer: Medicare HMO | Source: Ambulatory Visit | Attending: Radiation Oncology | Admitting: Radiation Oncology

## 2018-07-17 ENCOUNTER — Other Ambulatory Visit: Payer: Self-pay

## 2018-07-17 ENCOUNTER — Ambulatory Visit: Payer: Medicare HMO

## 2018-07-18 ENCOUNTER — Ambulatory Visit: Payer: Medicare HMO

## 2018-07-18 ENCOUNTER — Ambulatory Visit
Admission: RE | Admit: 2018-07-18 | Discharge: 2018-07-18 | Disposition: A | Discharge status: Home / Self Care | Payer: Medicare HMO | Source: Ambulatory Visit | Attending: Radiation Oncology | Admitting: Radiation Oncology

## 2018-07-18 ENCOUNTER — Other Ambulatory Visit: Payer: Self-pay

## 2018-07-18 DIAGNOSIS — Z510 Encounter for antineoplastic radiation therapy: Secondary | ICD-10-CM | POA: Diagnosis not present

## 2018-07-19 ENCOUNTER — Ambulatory Visit
Admission: RE | Admit: 2018-07-19 | Discharge: 2018-07-19 | Disposition: A | Discharge status: Home / Self Care | Payer: Medicare HMO | Source: Ambulatory Visit | Attending: Radiation Oncology | Admitting: Radiation Oncology

## 2018-07-19 ENCOUNTER — Other Ambulatory Visit: Payer: Self-pay

## 2018-07-19 DIAGNOSIS — Z510 Encounter for antineoplastic radiation therapy: Secondary | ICD-10-CM | POA: Diagnosis not present

## 2018-07-22 ENCOUNTER — Ambulatory Visit
Admission: RE | Admit: 2018-07-22 | Discharge: 2018-07-22 | Disposition: A | Discharge status: Home / Self Care | Payer: Medicare HMO | Source: Ambulatory Visit | Attending: Radiation Oncology | Admitting: Radiation Oncology

## 2018-07-22 ENCOUNTER — Other Ambulatory Visit: Payer: Self-pay

## 2018-07-22 DIAGNOSIS — Z510 Encounter for antineoplastic radiation therapy: Secondary | ICD-10-CM | POA: Diagnosis not present

## 2018-07-23 ENCOUNTER — Ambulatory Visit
Admission: RE | Admit: 2018-07-23 | Discharge: 2018-07-23 | Disposition: A | Discharge status: Home / Self Care | Payer: Medicare HMO | Source: Ambulatory Visit | Attending: Radiation Oncology | Admitting: Radiation Oncology

## 2018-07-23 ENCOUNTER — Other Ambulatory Visit: Payer: Self-pay

## 2018-07-23 DIAGNOSIS — Z510 Encounter for antineoplastic radiation therapy: Secondary | ICD-10-CM | POA: Diagnosis not present

## 2018-07-24 ENCOUNTER — Other Ambulatory Visit: Payer: Self-pay

## 2018-07-24 ENCOUNTER — Ambulatory Visit
Admission: RE | Admit: 2018-07-24 | Discharge: 2018-07-24 | Disposition: A | Discharge status: Home / Self Care | Payer: Medicare HMO | Source: Ambulatory Visit | Attending: Radiation Oncology | Admitting: Radiation Oncology

## 2018-07-24 DIAGNOSIS — Z510 Encounter for antineoplastic radiation therapy: Secondary | ICD-10-CM | POA: Diagnosis not present

## 2018-07-25 ENCOUNTER — Ambulatory Visit
Admission: RE | Admit: 2018-07-25 | Discharge: 2018-07-25 | Disposition: A | Discharge status: Home / Self Care | Payer: Medicare HMO | Source: Ambulatory Visit | Attending: Radiation Oncology | Admitting: Radiation Oncology

## 2018-07-25 ENCOUNTER — Other Ambulatory Visit: Payer: Self-pay

## 2018-07-25 DIAGNOSIS — Z510 Encounter for antineoplastic radiation therapy: Secondary | ICD-10-CM | POA: Diagnosis not present

## 2018-07-26 ENCOUNTER — Encounter (INDEPENDENT_AMBULATORY_CARE_PROVIDER_SITE_OTHER): Payer: Self-pay

## 2018-07-26 ENCOUNTER — Ambulatory Visit
Admission: RE | Admit: 2018-07-26 | Discharge: 2018-07-26 | Disposition: A | Discharge status: Home / Self Care | Payer: Medicare HMO | Source: Ambulatory Visit | Attending: Radiation Oncology | Admitting: Radiation Oncology

## 2018-07-26 ENCOUNTER — Other Ambulatory Visit: Payer: Self-pay

## 2018-07-26 DIAGNOSIS — I739 Peripheral vascular disease, unspecified: Secondary | ICD-10-CM | POA: Diagnosis not present

## 2018-07-26 DIAGNOSIS — C61 Malignant neoplasm of prostate: Secondary | ICD-10-CM | POA: Insufficient documentation

## 2018-07-26 DIAGNOSIS — E559 Vitamin D deficiency, unspecified: Secondary | ICD-10-CM | POA: Diagnosis not present

## 2018-07-26 DIAGNOSIS — Z7902 Long term (current) use of antithrombotics/antiplatelets: Secondary | ICD-10-CM | POA: Insufficient documentation

## 2018-07-26 DIAGNOSIS — Z510 Encounter for antineoplastic radiation therapy: Secondary | ICD-10-CM | POA: Diagnosis present

## 2018-07-26 DIAGNOSIS — Z87891 Personal history of nicotine dependence: Secondary | ICD-10-CM | POA: Insufficient documentation

## 2018-07-26 DIAGNOSIS — E785 Hyperlipidemia, unspecified: Secondary | ICD-10-CM | POA: Insufficient documentation

## 2018-07-26 DIAGNOSIS — Z86718 Personal history of other venous thrombosis and embolism: Secondary | ICD-10-CM | POA: Insufficient documentation

## 2018-07-29 ENCOUNTER — Other Ambulatory Visit: Payer: Self-pay

## 2018-07-29 ENCOUNTER — Ambulatory Visit
Admission: RE | Admit: 2018-07-29 | Discharge: 2018-07-29 | Disposition: A | Discharge status: Home / Self Care | Payer: Medicare HMO | Source: Ambulatory Visit | Attending: Radiation Oncology | Admitting: Radiation Oncology

## 2018-07-29 DIAGNOSIS — Z510 Encounter for antineoplastic radiation therapy: Secondary | ICD-10-CM | POA: Diagnosis not present

## 2018-07-30 ENCOUNTER — Other Ambulatory Visit: Payer: Self-pay

## 2018-07-30 ENCOUNTER — Ambulatory Visit
Admission: RE | Admit: 2018-07-30 | Discharge: 2018-07-30 | Disposition: A | Discharge status: Home / Self Care | Payer: Medicare HMO | Source: Ambulatory Visit | Attending: Radiation Oncology | Admitting: Radiation Oncology

## 2018-07-30 DIAGNOSIS — Z510 Encounter for antineoplastic radiation therapy: Secondary | ICD-10-CM | POA: Diagnosis not present

## 2018-07-31 ENCOUNTER — Ambulatory Visit
Admission: RE | Admit: 2018-07-31 | Discharge: 2018-07-31 | Disposition: A | Discharge status: Home / Self Care | Payer: Medicare HMO | Source: Ambulatory Visit | Attending: Radiation Oncology | Admitting: Radiation Oncology

## 2018-07-31 ENCOUNTER — Other Ambulatory Visit: Payer: Self-pay

## 2018-07-31 DIAGNOSIS — Z510 Encounter for antineoplastic radiation therapy: Secondary | ICD-10-CM | POA: Diagnosis not present

## 2018-08-01 ENCOUNTER — Encounter (INDEPENDENT_AMBULATORY_CARE_PROVIDER_SITE_OTHER): Payer: Self-pay

## 2018-08-01 ENCOUNTER — Other Ambulatory Visit: Payer: Self-pay

## 2018-08-01 ENCOUNTER — Inpatient Hospital Stay: Payer: Medicare HMO | Attending: Radiation Oncology

## 2018-08-01 ENCOUNTER — Ambulatory Visit
Admission: RE | Admit: 2018-08-01 | Discharge: 2018-08-01 | Disposition: A | Discharge status: Home / Self Care | Payer: Medicare HMO | Source: Ambulatory Visit | Attending: Radiation Oncology | Admitting: Radiation Oncology

## 2018-08-01 DIAGNOSIS — C61 Malignant neoplasm of prostate: Secondary | ICD-10-CM | POA: Diagnosis present

## 2018-08-01 DIAGNOSIS — Z510 Encounter for antineoplastic radiation therapy: Secondary | ICD-10-CM | POA: Diagnosis not present

## 2018-08-01 LAB — CBC
HCT: 42.4 % (ref 39.0–52.0)
Hemoglobin: 14.3 g/dL (ref 13.0–17.0)
MCH: 31.6 pg (ref 26.0–34.0)
MCHC: 33.7 g/dL (ref 30.0–36.0)
MCV: 93.6 fL (ref 80.0–100.0)
Platelets: 225 10*3/uL (ref 150–400)
RBC: 4.53 MIL/uL (ref 4.22–5.81)
RDW: 14.1 % (ref 11.5–15.5)
WBC: 7.9 10*3/uL (ref 4.0–10.5)
nRBC: 0 % (ref 0.0–0.2)

## 2018-08-02 ENCOUNTER — Other Ambulatory Visit: Payer: Self-pay

## 2018-08-02 ENCOUNTER — Ambulatory Visit
Admission: RE | Admit: 2018-08-02 | Discharge: 2018-08-02 | Disposition: A | Discharge status: Home / Self Care | Payer: Medicare HMO | Source: Ambulatory Visit | Attending: Radiation Oncology | Admitting: Radiation Oncology

## 2018-08-02 DIAGNOSIS — Z510 Encounter for antineoplastic radiation therapy: Secondary | ICD-10-CM | POA: Diagnosis not present

## 2018-08-05 ENCOUNTER — Ambulatory Visit
Admission: RE | Admit: 2018-08-05 | Discharge: 2018-08-05 | Disposition: A | Discharge status: Home / Self Care | Payer: Medicare HMO | Source: Ambulatory Visit | Attending: Radiation Oncology | Admitting: Radiation Oncology

## 2018-08-05 ENCOUNTER — Other Ambulatory Visit: Payer: Self-pay

## 2018-08-05 DIAGNOSIS — Z510 Encounter for antineoplastic radiation therapy: Secondary | ICD-10-CM | POA: Diagnosis not present

## 2018-08-06 ENCOUNTER — Ambulatory Visit
Admission: RE | Admit: 2018-08-06 | Discharge: 2018-08-06 | Disposition: A | Discharge status: Home / Self Care | Payer: Medicare HMO | Source: Ambulatory Visit | Attending: Radiation Oncology | Admitting: Radiation Oncology

## 2018-08-06 ENCOUNTER — Other Ambulatory Visit: Payer: Self-pay

## 2018-08-06 DIAGNOSIS — Z510 Encounter for antineoplastic radiation therapy: Secondary | ICD-10-CM | POA: Diagnosis not present

## 2018-08-07 ENCOUNTER — Ambulatory Visit
Admission: RE | Admit: 2018-08-07 | Discharge: 2018-08-07 | Disposition: A | Discharge status: Home / Self Care | Payer: Medicare HMO | Source: Ambulatory Visit | Attending: Radiation Oncology | Admitting: Radiation Oncology

## 2018-08-07 ENCOUNTER — Other Ambulatory Visit: Payer: Self-pay

## 2018-08-07 DIAGNOSIS — Z510 Encounter for antineoplastic radiation therapy: Secondary | ICD-10-CM | POA: Diagnosis not present

## 2018-08-08 ENCOUNTER — Other Ambulatory Visit: Payer: Self-pay

## 2018-08-08 ENCOUNTER — Ambulatory Visit
Admission: RE | Admit: 2018-08-08 | Discharge: 2018-08-08 | Disposition: A | Discharge status: Home / Self Care | Payer: Medicare HMO | Source: Ambulatory Visit | Attending: Radiation Oncology | Admitting: Radiation Oncology

## 2018-08-08 DIAGNOSIS — Z510 Encounter for antineoplastic radiation therapy: Secondary | ICD-10-CM | POA: Diagnosis not present

## 2018-08-09 ENCOUNTER — Other Ambulatory Visit: Payer: Self-pay

## 2018-08-09 ENCOUNTER — Ambulatory Visit
Admission: RE | Admit: 2018-08-09 | Discharge: 2018-08-09 | Disposition: A | Discharge status: Home / Self Care | Payer: Medicare HMO | Source: Ambulatory Visit | Attending: Radiation Oncology | Admitting: Radiation Oncology

## 2018-08-09 DIAGNOSIS — Z510 Encounter for antineoplastic radiation therapy: Secondary | ICD-10-CM | POA: Diagnosis not present

## 2018-08-12 ENCOUNTER — Ambulatory Visit
Admission: RE | Admit: 2018-08-12 | Discharge: 2018-08-12 | Disposition: A | Discharge status: Home / Self Care | Payer: Medicare HMO | Source: Ambulatory Visit | Attending: Radiation Oncology | Admitting: Radiation Oncology

## 2018-08-12 ENCOUNTER — Other Ambulatory Visit: Payer: Self-pay

## 2018-08-12 DIAGNOSIS — Z510 Encounter for antineoplastic radiation therapy: Secondary | ICD-10-CM | POA: Diagnosis not present

## 2018-08-13 ENCOUNTER — Other Ambulatory Visit: Payer: Self-pay

## 2018-08-13 ENCOUNTER — Ambulatory Visit
Admission: RE | Admit: 2018-08-13 | Discharge: 2018-08-13 | Disposition: A | Discharge status: Home / Self Care | Payer: Medicare HMO | Source: Ambulatory Visit | Attending: Radiation Oncology | Admitting: Radiation Oncology

## 2018-08-13 DIAGNOSIS — Z510 Encounter for antineoplastic radiation therapy: Secondary | ICD-10-CM | POA: Diagnosis not present

## 2018-08-14 ENCOUNTER — Other Ambulatory Visit: Payer: Self-pay

## 2018-08-14 ENCOUNTER — Ambulatory Visit
Admission: RE | Admit: 2018-08-14 | Discharge: 2018-08-14 | Disposition: A | Discharge status: Home / Self Care | Payer: Medicare HMO | Source: Ambulatory Visit | Attending: Radiation Oncology | Admitting: Radiation Oncology

## 2018-08-14 DIAGNOSIS — Z510 Encounter for antineoplastic radiation therapy: Secondary | ICD-10-CM | POA: Diagnosis not present

## 2018-08-15 ENCOUNTER — Other Ambulatory Visit: Payer: Self-pay

## 2018-08-15 ENCOUNTER — Inpatient Hospital Stay: Payer: Medicare HMO

## 2018-08-15 ENCOUNTER — Ambulatory Visit: Payer: Medicare HMO

## 2018-08-15 ENCOUNTER — Ambulatory Visit
Admission: RE | Admit: 2018-08-15 | Discharge: 2018-08-15 | Disposition: A | Discharge status: Home / Self Care | Payer: Medicare HMO | Source: Ambulatory Visit | Attending: Radiation Oncology | Admitting: Radiation Oncology

## 2018-08-15 DIAGNOSIS — C61 Malignant neoplasm of prostate: Secondary | ICD-10-CM

## 2018-08-15 DIAGNOSIS — Z510 Encounter for antineoplastic radiation therapy: Secondary | ICD-10-CM | POA: Diagnosis not present

## 2018-08-15 LAB — CBC
HCT: 42.9 % (ref 39.0–52.0)
Hemoglobin: 14.4 g/dL (ref 13.0–17.0)
MCH: 31.4 pg (ref 26.0–34.0)
MCHC: 33.6 g/dL (ref 30.0–36.0)
MCV: 93.7 fL (ref 80.0–100.0)
Platelets: 211 10*3/uL (ref 150–400)
RBC: 4.58 MIL/uL (ref 4.22–5.81)
RDW: 13.9 % (ref 11.5–15.5)
WBC: 7.2 10*3/uL (ref 4.0–10.5)
nRBC: 0 % (ref 0.0–0.2)

## 2018-08-16 ENCOUNTER — Other Ambulatory Visit: Payer: Self-pay

## 2018-08-16 ENCOUNTER — Ambulatory Visit
Admission: RE | Admit: 2018-08-16 | Discharge: 2018-08-16 | Disposition: A | Discharge status: Home / Self Care | Payer: Medicare HMO | Source: Ambulatory Visit | Attending: Radiation Oncology | Admitting: Radiation Oncology

## 2018-08-16 DIAGNOSIS — Z510 Encounter for antineoplastic radiation therapy: Secondary | ICD-10-CM | POA: Diagnosis not present

## 2018-08-20 ENCOUNTER — Ambulatory Visit
Admission: RE | Admit: 2018-08-20 | Discharge: 2018-08-20 | Disposition: A | Discharge status: Home / Self Care | Payer: Medicare HMO | Source: Ambulatory Visit | Attending: Radiation Oncology | Admitting: Radiation Oncology

## 2018-08-20 ENCOUNTER — Other Ambulatory Visit: Payer: Self-pay

## 2018-08-20 DIAGNOSIS — Z510 Encounter for antineoplastic radiation therapy: Secondary | ICD-10-CM | POA: Diagnosis not present

## 2018-08-21 ENCOUNTER — Ambulatory Visit
Admission: RE | Admit: 2018-08-21 | Discharge: 2018-08-21 | Disposition: A | Discharge status: Home / Self Care | Payer: Medicare HMO | Source: Ambulatory Visit | Attending: Radiation Oncology | Admitting: Radiation Oncology

## 2018-08-21 ENCOUNTER — Other Ambulatory Visit: Payer: Self-pay

## 2018-08-21 DIAGNOSIS — Z510 Encounter for antineoplastic radiation therapy: Secondary | ICD-10-CM | POA: Diagnosis not present

## 2018-08-22 ENCOUNTER — Other Ambulatory Visit: Payer: Self-pay

## 2018-08-22 ENCOUNTER — Ambulatory Visit
Admission: RE | Admit: 2018-08-22 | Discharge: 2018-08-22 | Disposition: A | Discharge status: Home / Self Care | Payer: Medicare HMO | Source: Ambulatory Visit | Attending: Radiation Oncology | Admitting: Radiation Oncology

## 2018-08-22 DIAGNOSIS — Z510 Encounter for antineoplastic radiation therapy: Secondary | ICD-10-CM | POA: Diagnosis not present

## 2018-08-23 ENCOUNTER — Ambulatory Visit
Admission: RE | Admit: 2018-08-23 | Discharge: 2018-08-23 | Disposition: A | Discharge status: Home / Self Care | Payer: Medicare HMO | Source: Ambulatory Visit | Attending: Radiation Oncology | Admitting: Radiation Oncology

## 2018-08-23 ENCOUNTER — Other Ambulatory Visit: Payer: Self-pay

## 2018-08-23 DIAGNOSIS — Z510 Encounter for antineoplastic radiation therapy: Secondary | ICD-10-CM | POA: Diagnosis not present

## 2018-08-26 ENCOUNTER — Ambulatory Visit: Payer: Medicare HMO

## 2018-08-27 ENCOUNTER — Ambulatory Visit: Payer: Medicare HMO

## 2018-08-28 ENCOUNTER — Ambulatory Visit: Payer: Medicare HMO

## 2018-08-29 ENCOUNTER — Inpatient Hospital Stay: Payer: Medicare HMO | Attending: Radiation Oncology

## 2018-08-29 ENCOUNTER — Ambulatory Visit: Payer: Medicare HMO

## 2018-08-30 ENCOUNTER — Ambulatory Visit: Payer: Medicare HMO

## 2018-09-02 ENCOUNTER — Ambulatory Visit: Payer: Medicare HMO

## 2018-09-03 ENCOUNTER — Ambulatory Visit
Admission: RE | Admit: 2018-09-03 | Discharge: 2018-09-03 | Disposition: A | Discharge status: Home / Self Care | Payer: Medicare HMO | Source: Ambulatory Visit | Attending: Radiation Oncology | Admitting: Radiation Oncology

## 2018-09-03 ENCOUNTER — Other Ambulatory Visit: Payer: Self-pay

## 2018-09-03 DIAGNOSIS — Z87891 Personal history of nicotine dependence: Secondary | ICD-10-CM | POA: Diagnosis not present

## 2018-09-03 DIAGNOSIS — Z510 Encounter for antineoplastic radiation therapy: Secondary | ICD-10-CM | POA: Insufficient documentation

## 2018-09-03 DIAGNOSIS — E559 Vitamin D deficiency, unspecified: Secondary | ICD-10-CM | POA: Insufficient documentation

## 2018-09-03 DIAGNOSIS — E785 Hyperlipidemia, unspecified: Secondary | ICD-10-CM | POA: Diagnosis not present

## 2018-09-03 DIAGNOSIS — Z7902 Long term (current) use of antithrombotics/antiplatelets: Secondary | ICD-10-CM | POA: Insufficient documentation

## 2018-09-03 DIAGNOSIS — I739 Peripheral vascular disease, unspecified: Secondary | ICD-10-CM | POA: Diagnosis not present

## 2018-09-03 DIAGNOSIS — C61 Malignant neoplasm of prostate: Secondary | ICD-10-CM | POA: Diagnosis not present

## 2018-09-03 DIAGNOSIS — Z86718 Personal history of other venous thrombosis and embolism: Secondary | ICD-10-CM | POA: Diagnosis not present

## 2018-09-04 ENCOUNTER — Ambulatory Visit
Admission: RE | Admit: 2018-09-04 | Discharge: 2018-09-04 | Disposition: A | Discharge status: Home / Self Care | Payer: Medicare HMO | Source: Ambulatory Visit | Attending: Radiation Oncology | Admitting: Radiation Oncology

## 2018-09-04 ENCOUNTER — Other Ambulatory Visit: Payer: Self-pay

## 2018-09-04 DIAGNOSIS — Z510 Encounter for antineoplastic radiation therapy: Secondary | ICD-10-CM | POA: Diagnosis not present

## 2018-09-05 ENCOUNTER — Other Ambulatory Visit: Payer: Self-pay

## 2018-09-05 ENCOUNTER — Ambulatory Visit
Admission: RE | Admit: 2018-09-05 | Discharge: 2018-09-05 | Disposition: A | Discharge status: Home / Self Care | Payer: Medicare HMO | Source: Ambulatory Visit | Attending: Radiation Oncology | Admitting: Radiation Oncology

## 2018-09-05 DIAGNOSIS — Z510 Encounter for antineoplastic radiation therapy: Secondary | ICD-10-CM | POA: Diagnosis not present

## 2018-09-06 ENCOUNTER — Ambulatory Visit
Admission: RE | Admit: 2018-09-06 | Discharge: 2018-09-06 | Disposition: A | Discharge status: Home / Self Care | Payer: Medicare HMO | Source: Ambulatory Visit | Attending: Radiation Oncology | Admitting: Radiation Oncology

## 2018-09-06 ENCOUNTER — Other Ambulatory Visit: Payer: Self-pay

## 2018-09-06 DIAGNOSIS — Z510 Encounter for antineoplastic radiation therapy: Secondary | ICD-10-CM | POA: Diagnosis not present

## 2018-09-09 ENCOUNTER — Ambulatory Visit
Admission: RE | Admit: 2018-09-09 | Discharge: 2018-09-09 | Disposition: A | Discharge status: Home / Self Care | Payer: Medicare HMO | Source: Ambulatory Visit | Attending: Radiation Oncology | Admitting: Radiation Oncology

## 2018-09-09 ENCOUNTER — Other Ambulatory Visit: Payer: Self-pay

## 2018-09-09 DIAGNOSIS — Z510 Encounter for antineoplastic radiation therapy: Secondary | ICD-10-CM | POA: Diagnosis not present

## 2018-09-10 ENCOUNTER — Other Ambulatory Visit: Payer: Self-pay

## 2018-09-10 ENCOUNTER — Ambulatory Visit
Admission: RE | Admit: 2018-09-10 | Discharge: 2018-09-10 | Disposition: A | Discharge status: Home / Self Care | Payer: Medicare HMO | Source: Ambulatory Visit | Attending: Radiation Oncology | Admitting: Radiation Oncology

## 2018-09-10 DIAGNOSIS — Z510 Encounter for antineoplastic radiation therapy: Secondary | ICD-10-CM | POA: Diagnosis not present

## 2018-09-11 ENCOUNTER — Other Ambulatory Visit: Payer: Self-pay

## 2018-09-11 ENCOUNTER — Ambulatory Visit
Admission: RE | Admit: 2018-09-11 | Discharge: 2018-09-11 | Disposition: A | Discharge status: Home / Self Care | Payer: Medicare HMO | Source: Ambulatory Visit | Attending: Radiation Oncology | Admitting: Radiation Oncology

## 2018-09-11 DIAGNOSIS — Z510 Encounter for antineoplastic radiation therapy: Secondary | ICD-10-CM | POA: Diagnosis not present

## 2018-09-12 ENCOUNTER — Other Ambulatory Visit: Payer: Self-pay

## 2018-09-12 ENCOUNTER — Ambulatory Visit: Payer: Medicare HMO

## 2018-09-12 ENCOUNTER — Ambulatory Visit
Admission: RE | Admit: 2018-09-12 | Discharge: 2018-09-12 | Disposition: A | Discharge status: Home / Self Care | Payer: Medicare HMO | Source: Ambulatory Visit | Attending: Radiation Oncology | Admitting: Radiation Oncology

## 2018-09-12 DIAGNOSIS — Z510 Encounter for antineoplastic radiation therapy: Secondary | ICD-10-CM | POA: Diagnosis not present

## 2018-09-13 ENCOUNTER — Other Ambulatory Visit: Payer: Self-pay

## 2018-09-13 ENCOUNTER — Ambulatory Visit
Admission: RE | Admit: 2018-09-13 | Discharge: 2018-09-13 | Disposition: A | Discharge status: Home / Self Care | Payer: Medicare HMO | Source: Ambulatory Visit | Attending: Radiation Oncology | Admitting: Radiation Oncology

## 2018-09-13 DIAGNOSIS — Z510 Encounter for antineoplastic radiation therapy: Secondary | ICD-10-CM | POA: Diagnosis not present

## 2018-09-16 ENCOUNTER — Other Ambulatory Visit: Payer: Self-pay

## 2018-09-16 ENCOUNTER — Ambulatory Visit
Admission: RE | Admit: 2018-09-16 | Discharge: 2018-09-16 | Disposition: A | Discharge status: Home / Self Care | Payer: Medicare HMO | Source: Ambulatory Visit | Attending: Radiation Oncology | Admitting: Radiation Oncology

## 2018-09-16 DIAGNOSIS — Z510 Encounter for antineoplastic radiation therapy: Secondary | ICD-10-CM | POA: Diagnosis not present

## 2018-09-17 ENCOUNTER — Ambulatory Visit: Payer: Medicare HMO

## 2018-09-17 ENCOUNTER — Ambulatory Visit
Admission: RE | Admit: 2018-09-17 | Discharge: 2018-09-17 | Disposition: A | Discharge status: Home / Self Care | Payer: Medicare HMO | Source: Ambulatory Visit | Attending: Radiation Oncology | Admitting: Radiation Oncology

## 2018-09-17 ENCOUNTER — Other Ambulatory Visit: Payer: Self-pay

## 2018-09-17 DIAGNOSIS — Z510 Encounter for antineoplastic radiation therapy: Secondary | ICD-10-CM | POA: Diagnosis not present

## 2018-09-18 ENCOUNTER — Ambulatory Visit: Payer: Medicare HMO

## 2018-09-18 ENCOUNTER — Other Ambulatory Visit: Payer: Self-pay

## 2018-09-18 ENCOUNTER — Ambulatory Visit
Admission: RE | Admit: 2018-09-18 | Discharge: 2018-09-18 | Disposition: A | Discharge status: Home / Self Care | Payer: Medicare HMO | Source: Ambulatory Visit | Attending: Radiation Oncology | Admitting: Radiation Oncology

## 2018-09-18 DIAGNOSIS — Z510 Encounter for antineoplastic radiation therapy: Secondary | ICD-10-CM | POA: Diagnosis not present

## 2018-09-19 ENCOUNTER — Ambulatory Visit
Admission: RE | Admit: 2018-09-19 | Discharge: 2018-09-19 | Disposition: A | Discharge status: Home / Self Care | Payer: Medicare HMO | Source: Ambulatory Visit | Attending: Radiation Oncology | Admitting: Radiation Oncology

## 2018-09-19 ENCOUNTER — Ambulatory Visit: Payer: Medicare HMO

## 2018-09-19 ENCOUNTER — Other Ambulatory Visit: Payer: Self-pay

## 2018-09-19 DIAGNOSIS — Z510 Encounter for antineoplastic radiation therapy: Secondary | ICD-10-CM | POA: Diagnosis not present

## 2018-09-20 ENCOUNTER — Ambulatory Visit
Admission: RE | Admit: 2018-09-20 | Discharge: 2018-09-20 | Disposition: A | Discharge status: Home / Self Care | Payer: Medicare HMO | Source: Ambulatory Visit | Attending: Radiation Oncology | Admitting: Radiation Oncology

## 2018-09-20 ENCOUNTER — Other Ambulatory Visit: Payer: Self-pay

## 2018-09-20 DIAGNOSIS — Z510 Encounter for antineoplastic radiation therapy: Secondary | ICD-10-CM | POA: Diagnosis not present

## 2018-10-30 ENCOUNTER — Ambulatory Visit: Payer: Medicare HMO | Attending: Radiation Oncology | Admitting: Radiation Oncology

## 2018-11-05 ENCOUNTER — Other Ambulatory Visit: Payer: Self-pay

## 2018-11-05 DIAGNOSIS — Z20822 Contact with and (suspected) exposure to covid-19: Secondary | ICD-10-CM

## 2018-11-06 LAB — NOVEL CORONAVIRUS, NAA: SARS-CoV-2, NAA: DETECTED — AB

## 2018-11-07 ENCOUNTER — Other Ambulatory Visit: Payer: Self-pay

## 2018-11-08 ENCOUNTER — Other Ambulatory Visit: Payer: Self-pay

## 2018-11-08 NOTE — Patient Outreach (Signed)
[facility]) Care Management  11/08/2018  Jerry [name] 02-Nov-1945 [id number]   Telephone Screen  Referral Date: 11/07/2018 Referral Source: Join EMMI Call Referral Reason: " patient engagement score 8, positive for COVID-19" Insurance: [employer] message received from spouse requesting return call. Call placed. Spoke with both patient and spouse. 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 Patient got test results yesterday that he was positive. He voices that he has not been having any symptoms except some dizziness when he first stands. He has BP cuff in the home and spouse has been checking BP. They are unable to recall what BP readings have been. Spouse went to obtain machine to check BP while on the phone. Patient reported that no one has told him anything on what to do and how to handle COVID. Patient was unaware that he needed to self quarantine per reccommended CDC guidelines. RN CM shared with patient this as well as safety precautions spouse needs to take as well. Call then got disconnected. RN CM attempted to reach patient back several times without success. Spouse then called RN CM back. She voices that BP was 149/73 and HR 53. Spouse does not know if this is "normal" for patient as she does not record readings. RN CM instructed spouse to start recording date/time along with BP and HR readings. Discussed with spouse normal BP and HR ranges. She voiced understanding. She states that patient adds a lot of salt to meals without even first tasting it. RN CM discussed with patient the dangers of this and importance of low salt diet. She reports she has already called MD office earlier this morning to advise them of patient's symptoms and will alert them of BP/HR readings. Patient not dizzy at present. Spouse reviewed meds with RN CM and patient currently not taking any BP meds. 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 Spouse confirms that she is exercising proper safety measures given patient's positive test results. She reports that she went yesterday to get tested and is awaiting results. She is currently having no symptoms. They have contacted other people/relatives that they have been in close contact with and they all went and got tested on yesterday. RN CM reviewed with spouse s/s of worsening condition and when to seek medical attention.       Plan: RN CM will follow up with pt/spouse within a week.   Enzo Montgomery, RN,BSN,CCM [facility] Telephonic Care Management Coordinator Direct Phone: [phone] Toll Free: [phone] Fax: [phone]

## 2018-11-08 NOTE — Patient Outreach (Signed)
[facility]) Care Management  11/08/2018  FAVOR HACKLER [birth year] [id number]   Telephone Screen  Referral Date: 11/07/2018 Referral Source: Join EMMI Call Referral Reason: " patient engagement score 8, positive for COVID-19" Insurance: [facility]   Outreach attempt # 1 to patient. No answer after multiple rings and unable to leave message.     Plan: RN CM will make outreach attempt to patient within 3-4 business days. RN CM will send unsuccessful outreach letter to patient.  Enzo Montgomery, RN,BSN,CCM [place] [facility] Telephonic Care Management Coordinator Direct Phone: [phone] Toll Free: [phone] Fax: [phone]

## 2018-11-12 ENCOUNTER — Ambulatory Visit (INDEPENDENT_AMBULATORY_CARE_PROVIDER_SITE_OTHER): Payer: Medicare HMO | Admitting: Vascular Surgery

## 2018-11-12 ENCOUNTER — Encounter (INDEPENDENT_AMBULATORY_CARE_PROVIDER_SITE_OTHER): Payer: Medicare HMO

## 2018-11-12 ENCOUNTER — Other Ambulatory Visit: Payer: Self-pay

## 2018-11-12 NOTE — Patient Outreach (Signed)
[facility]) Care Management  11/12/2018  DAWN KIPER 1945-09-07 [id number]   Follow Up Call   Outreach attempt to follow up on previous call to patient. Spoke with both spouse and patient. Spouse pleased to report that her COVID-19 test results were negative. She continues to exercise precautions to ensure she does not contract it from spouse. Patient reports that he has been feeling fine since last speaking with RN CM. He denies any further dizziness spells. He voices that PCP put him on med to help with that. Spouse is monitoring and recording BP and HR and reports normal readings. Patient states that he has not had a cigarette in the past 4-5 days. RN CM encouraged and praised patient for trying to quit smoking. He voices that today he woke up with some mild coughing. He reports that cough was slightly productive. He states it took him a "while to get it up." He has cough med in the home and patient encouraged to use it as directed. Also, encouraged patient to drink plenty of fluids to help loosen secretions. Patient denies any fever. Spouse voices that he is due to come off of quarantine on 11/17/2018. Reviewed s/s of worsening condition and when to seek medical attention. Patient voiced understanding. He denies any further RN CM needs or concerns a this time. He was very appreciative of follow up call. He is aware that he call [facility] at any time for any future needs or concerns.      Plan: RN CM will close case at this time.   Enzo Montgomery, RN,BSN,CCM [place] [facility] Telephonic Care Management Coordinator Direct Phone: [phone] Toll Free: [phone] Fax: [phone]

## 2018-12-24 ENCOUNTER — Ambulatory Visit (INDEPENDENT_AMBULATORY_CARE_PROVIDER_SITE_OTHER): Payer: Medicare HMO | Admitting: Vascular Surgery

## 2018-12-24 ENCOUNTER — Encounter (INDEPENDENT_AMBULATORY_CARE_PROVIDER_SITE_OTHER): Payer: Medicare HMO

## 2018-12-26 ENCOUNTER — Ambulatory Visit (INDEPENDENT_AMBULATORY_CARE_PROVIDER_SITE_OTHER): Payer: Medicare HMO | Admitting: Nurse Practitioner

## 2018-12-26 ENCOUNTER — Encounter (INDEPENDENT_AMBULATORY_CARE_PROVIDER_SITE_OTHER): Payer: Medicare HMO

## 2019-01-13 NOTE — Progress Notes (Deleted)
01/14/2019 1:12 PM   Jerry [name] 1945-10-27 PO:[id number]  Referring provider: Marguerita Merles, [place],  [place] [address]  No chief complaint on file.   HPI: Mr. Zappala is 73 year old male with intermediate risk unfavorable prostate cancer who presents today to continue androgen deprivation therapy.    Prostate cancer history PE: left prostate nodule, PSA 3.7 MRI: PI-RADS 5 lesion on MRI Fusion bx: The MR region of interest was positive for Gleason 4+3 and 2 cores.  He also had positive biopsies at the left lateral base and left lateral mid prostate and Gleason 3+4 and 4+3 respectively.  The left mid prostate also showed Gleason 4+3 and 1 core.    PMH: Past Medical History:  Diagnosis Date  . Anemia    patient unaware of this diagnosis  . Cigarette smoker   . Hearing loss   . Hyperlipidemia   . Leg DVT (deep venous thromboembolism), acute, left ([place])   . Peripheral vascular disease (HCC)    blockages in both extremities  . Vitamin D deficiency    patient unaware of this diagnosis    Surgical History: Past Surgical History:  Procedure Laterality Date  . ANGIOPLASTY ILLIAC ARTERY Left 10/03/2017   Procedure: ANGIOPLASTY ILIAC ARTERY;  Surgeon: Algernon Huxley, MD;  Location: [facility] ORS;  Service: Vascular;  Laterality: Left;  . BACK SURGERY  1990   Three sx in lower back. metal in lower back  . COLONOSCOPY  01/2010  . ENDARTERECTOMY FEMORAL Left 10/03/2017   Procedure: ENDARTERECTOMY FEMORAL;  Surgeon: Algernon Huxley, MD;  Location: [facility] ORS;  Service: Vascular;  Laterality: Left;  . Left Leg stent Left 2009   fem fem bypass  . LOWER EXTREMITY ANGIOGRAPHY Left 09/19/2017   Procedure: LOWER EXTREMITY ANGIOGRAPHY;  Surgeon: Algernon Huxley, MD;  Location: [place] [facility];  Service: Cardiovascular;  Laterality: Left;  . LOWER EXTREMITY ANGIOGRAPHY Right 01/07/2018   Procedure: LOWER EXTREMITY ANGIOGRAPHY;  Surgeon: Algernon Huxley, MD;  Location: [place]  [facility];  Service: Cardiovascular;  Laterality: Right;  . PTCA Right 08/2017  . VASCULAR SURGERY Left 2009   fem fem bypass     Home Medications:  Allergies as of 01/14/2019   No Known Allergies     Medication List       Accurate as of January 13, 2019  1:12 PM. If you have any questions, ask your nurse or doctor.        acetaminophen 325 MG tablet Commonly known as: TYLENOL Take 650 mg by mouth every 6 (six) hours as needed for moderate pain.   aspirin EC 81 MG tablet Take 81 mg by mouth daily.   atorvastatin 10 MG tablet Commonly known as: LIPITOR TAKE 1 TABLET EVERY DAY   cholecalciferol 1000 units tablet Commonly known as: VITAMIN D Take 1,000 Units by mouth daily.   clopidogrel 75 MG tablet Commonly known as: PLAVIX TAKE 1 TABLET EVERY DAY   ibuprofen 600 MG tablet Commonly known as: ADVIL Take 1 tablet (600 mg total) by mouth every 8 (eight) hours as needed.   multivitamin capsule Take 1 capsule by mouth as needed.       Allergies: No Known Allergies  Family History: Family History  Problem Relation Age of Onset  . Colon cancer Neg Hx     Social History:  reports that he quit smoking about 13 months ago. His smoking use included cigarettes. He has a 40.00  pack-year smoking history. He has never used smokeless tobacco. He reports that he does not drink alcohol or use drugs.  ROS:                                        Physical Exam: There were no vitals taken for this visit.  Constitutional:  Well nourished. Alert and oriented, No acute distress. HEENT: [place] AT, moist mucus membranes.  Trachea midline, no masses. Cardiovascular: No clubbing, cyanosis, or edema. Respiratory: Normal respiratory effort, no increased work of breathing. Neurologic: Grossly intact, no focal deficits, moving all 4 extremities. Psychiatric: Normal mood and affect.  Laboratory Data: Lab Results  Component Value Date   WBC 7.2 08/15/2018   HGB  14.4 08/15/2018   HCT 42.9 08/15/2018   MCV 93.7 08/15/2018   PLT 211 08/15/2018    Lab Results  Component Value Date   CREATININE 1.05 01/04/2018    No results found for: PSA  No results found for: TESTOSTERONE  No results found for: HGBA1C  No results found for: TSH  No results found for: CHOL, HDL, CHOLHDL, VLDL, LDLCALC  Lab Results  Component Value Date   AST 27 07/18/2011   Lab Results  Component Value Date   ALT 19 07/18/2011   No components found for: ALKALINEPHOPHATASE No components found for: BILIRUBINTOTAL  No results found for: ESTRADIOL  Urinalysis    Component Value Date/Time   COLORURINE Amber 07/18/2011 1143   APPEARANCEUR Hazy 07/18/2011 1143   LABSPEC 1.029 07/18/2011 1143   PHURINE 5.0 07/18/2011 1143   GLUCOSEU Negative 07/18/2011 1143   HGBUR 1+ 07/18/2011 1143   BILIRUBINUR Negative 07/18/2011 1143   KETONESUR Negative 07/18/2011 1143   PROTEINUR 30 mg/dL 07/18/2011 1143   NITRITE Negative 07/18/2011 1143   LEUKOCYTESUR Negative 07/18/2011 1143    I have reviewed the labs.    Assessment & Plan:   1. Unfavorable intermediate risk prostate cancer (HCC) - Leuprolide Acetate (6 Month) (LUPRON) injection 45 mg We did discuss the risks of ADT therapy, such as: weight gain, ED, decrease in libido, fatigue, hot flashes, back pain, joint pain, chills, constipation, heart disease, itching where the injection is given and pain where the injection is given.  We also discussed bone health and I have advised the patient to start Calcium 600 gm twice daily and Vitamin D 200 mg twice daily. - continue ADT for 2 years (started 06/2018)   - Keep appointments for radiation therapy - RTC in 6 months for PSA and 6 month Lupron injection    No follow-ups on file.  These notes generated with voice recognition software. I apologize for typographical errors.  Zara Council, PA-C  [facility] [address]  [place]  [place], [place] [address] [phone]

## 2019-01-14 ENCOUNTER — Encounter: Payer: Self-pay | Admitting: Urology

## 2019-01-14 ENCOUNTER — Other Ambulatory Visit: Payer: Self-pay

## 2019-01-14 ENCOUNTER — Ambulatory Visit: Payer: Medicare HMO | Admitting: Urology

## 2019-01-14 DIAGNOSIS — C61 Malignant neoplasm of prostate: Secondary | ICD-10-CM

## 2019-02-13 ENCOUNTER — Telehealth: Payer: Self-pay

## 2019-02-13 NOTE — Telephone Encounter (Signed)
Left patient a message to call and get scheduled for Eligard he is a month over due

## 2019-02-18 NOTE — Telephone Encounter (Signed)
Left patient mess to call 

## 2019-02-25 NOTE — Telephone Encounter (Signed)
Called patient no answer, mailed a letter to the patient

## 2019-02-25 NOTE — Telephone Encounter (Signed)
Patient's wife returned call and scheduled for follow up, letter was shredded

## 2019-02-28 ENCOUNTER — Other Ambulatory Visit: Payer: Self-pay

## 2019-02-28 ENCOUNTER — Ambulatory Visit (INDEPENDENT_AMBULATORY_CARE_PROVIDER_SITE_OTHER): Payer: Medicare HMO | Admitting: *Deleted

## 2019-02-28 DIAGNOSIS — C61 Malignant neoplasm of prostate: Secondary | ICD-10-CM

## 2019-02-28 MED ORDER — LEUPROLIDE ACETATE (6 MONTH) 45 MG ~~LOC~~ KIT
45.0000 mg | PACK | Freq: Once | SUBCUTANEOUS | Status: AC
  Administered 2019-02-28: 45 mg via SUBCUTANEOUS

## 2019-02-28 NOTE — Progress Notes (Signed)
Eligard SubQ Injection   Due to Prostate Cancer patient is present today for a Eligard Injection.  Medication: Eligard 6 month Dose: 45 mg  Location: right lower abdominal tissue Lot: [id number] Exp: 09/23/2020  Patient tolerated well, no complications were noted  Performed by: Verlene Mayer, CMA   Follow up in 6 months

## 2019-02-28 NOTE — Patient Instructions (Signed)
Take vitamin D and Calcium daily

## 2019-03-01 LAB — PSA: Prostate Specific Ag, Serum: <0.1 ng/mL (ref 0.0–4.0)

## 2019-03-03 ENCOUNTER — Telehealth: Payer: Self-pay | Admitting: Urology

## 2019-03-03 ENCOUNTER — Telehealth: Payer: Self-pay | Admitting: *Deleted

## 2019-03-03 NOTE — Telephone Encounter (Signed)
We need to change Mr. Herford appointment in June to one of the providers with PSA prior and for ADT.

## 2019-03-03 NOTE — Telephone Encounter (Signed)
Notified patient as instructed, patient pleased. Discussed follow-up appointments, patient agrees  

## 2019-03-03 NOTE — Telephone Encounter (Signed)
-----   Message from Nori Riis, PA-C sent at 03/03/2019  7:51 AM EST ----- Please let Mr. Gottschall know that his PSA is undetectable which is good.  We will see him in June.

## 2019-09-04 ENCOUNTER — Other Ambulatory Visit (INDEPENDENT_AMBULATORY_CARE_PROVIDER_SITE_OTHER): Payer: Self-pay | Admitting: Vascular Surgery

## 2019-09-04 NOTE — Telephone Encounter (Signed)
Patient will need an appointment with ABIs prior to refill

## 2019-09-04 NOTE — Telephone Encounter (Signed)
Is it ok to refill this medication the pt hasn't been seen in a year?

## 2019-09-05 ENCOUNTER — Ambulatory Visit: Payer: Medicare HMO

## 2019-09-05 NOTE — Telephone Encounter (Signed)
Please call the pt and schedule the pt for ABI's with an appointment per the NP.

## 2019-09-08 ENCOUNTER — Ambulatory Visit (INDEPENDENT_AMBULATORY_CARE_PROVIDER_SITE_OTHER): Payer: Medicare HMO | Admitting: Urology

## 2019-09-08 ENCOUNTER — Encounter: Payer: Self-pay | Admitting: Urology

## 2019-09-08 ENCOUNTER — Other Ambulatory Visit: Payer: Self-pay

## 2019-09-08 VITALS — BP 152/77 | HR 80 | Ht 65.0 in | Wt 137.0 lb

## 2019-09-08 DIAGNOSIS — C61 Malignant neoplasm of prostate: Secondary | ICD-10-CM

## 2019-09-08 MED ORDER — LEUPROLIDE ACETATE (6 MONTH) 45 MG ~~LOC~~ KIT
45.0000 mg | PACK | Freq: Once | SUBCUTANEOUS | Status: AC
Start: 2019-09-08 — End: 2019-09-08
  Administered 2019-09-08: 45 mg via SUBCUTANEOUS

## 2019-09-08 NOTE — Progress Notes (Signed)
PATIENT ID: Jerry [name], male     DOB: [birth year], 74 y.o.     MRN: [id number]   ENCOUNTER: 09/08/19, 12:41 PM     REFERRING PROVIDER: Marguerita Merles, [place],  [place] [address]  Chief Complaint  Patient presents with   Follow-up    Urologic history: 1.  Prostate cancer -T2 intermediate (unfavorable) risk prostate cancer -Left prostate nodule and PI-RADS 5 lesion on MRI -Fusion biopsy 04/2018; ROI positive Gleason 4+3 -Additional positive cores on standard template 3+/4+3 -IMRT + ADT; IMRT completed 08/2018; first leuprolide 06/2018   HPI: Jerry [name] is a 74 y.o. male with intermediate risk unfavorable prostate cancer. He is here today for his routine follow up and PSA check.    Since completing radiation therapy, he complains of frequency, urgency, and incontenince. This is mostly difficult for him with difficulty in being able to retract his foreskin.  Did however see Larene Beach October 2019 (pretreatment) complaining of urge incontinence  PSA 02/2019 <0.1   PMHx: Past Medical History:  Diagnosis Date   Anemia    patient unaware of this diagnosis   Cigarette smoker    Hearing loss    Hyperlipidemia    Leg DVT (deep venous thromboembolism), acute, left (HCC)    Peripheral vascular disease (HCC)    blockages in both extremities   Vitamin D deficiency    patient unaware of this diagnosis    SURGICAL HISTORY: Past Surgical History:  Procedure Laterality Date   ANGIOPLASTY ILLIAC ARTERY Left 10/03/2017   Procedure: ANGIOPLASTY ILIAC ARTERY;  Surgeon: Algernon Huxley, MD;  Location: [facility];  Service: Vascular;  Laterality: Left;   BACK SURGERY  1990   Three sx in lower back. metal in lower back   COLONOSCOPY  01/2010   ENDARTERECTOMY FEMORAL Left 10/03/2017   Procedure: ENDARTERECTOMY FEMORAL;  Surgeon: Algernon Huxley, MD;  Location: [facility];  Service: Vascular;  Laterality: Left;   Left Leg stent Left 2009   fem fem bypass   LOWER  EXTREMITY ANGIOGRAPHY Left 09/19/2017   Procedure: LOWER EXTREMITY ANGIOGRAPHY;  Surgeon: Algernon Huxley, MD;  Location: [place] [facility];  Service: Cardiovascular;  Laterality: Left;   LOWER EXTREMITY ANGIOGRAPHY Right 01/07/2018   Procedure: LOWER EXTREMITY ANGIOGRAPHY;  Surgeon: Algernon Huxley, MD;  Location: [place] [facility];  Service: Cardiovascular;  Laterality: Right;   PTCA Right 08/2017   VASCULAR SURGERY Left 2009   fem fem bypass     HOME MEDICATIONS:  Allergies as of 09/08/2019   No Known Allergies     Medication List       Accurate as of September 08, 2019 12:41 PM. If you have any questions, ask your nurse or doctor.        acetaminophen 325 MG tablet Commonly known as: TYLENOL Take 650 mg by mouth every 6 (six) hours as needed for moderate pain.   aspirin EC 81 MG tablet Take 81 mg by mouth daily.   atorvastatin 10 MG tablet Commonly known as: LIPITOR TAKE 1 TABLET EVERY DAY   cholecalciferol 1000 units tablet Commonly known as: VITAMIN D Take 1,000 Units by mouth daily.   clopidogrel 75 MG tablet Commonly known as: PLAVIX TAKE 1 TABLET EVERY DAY   ibuprofen 600 MG tablet Commonly known as: ADVIL Take 1 tablet (600 mg total) by mouth every 8 (eight) hours as needed.   multivitamin capsule Take 1 capsule by mouth as needed.  ALLERGIES: No Known Allergies  FAMILY HISTORY: Family History  Problem Relation Age of Onset   Colon cancer Neg Hx     SOCIAL HISTORY:  reports that he quit smoking about 21 months ago. His smoking use included cigarettes. He has a 40.00 pack-year smoking history. He has never used smokeless tobacco. He reports that he does not drink alcohol and does not use drugs.  PHYSICAL EXAM: BP (!) 152/77    Pulse 80    Ht 5\' 5"  (1.651 m)    Wt 137 lb (62.1 kg)    BMI 22.80 kg/m   Constitutional:  Alert and oriented, No acute distress. HEENT: [place] AT, moist mucus membranes.  Trachea midline, no masses. Cardiovascular: No  clubbing, cyanosis, or edema. Respiratory: Normal respiratory effort, no increased work of breathing. GI: Abdomen is soft, nontender, nondistended, no abdominal masses GU: No CVA tenderness  Penis- foreskin tight but retractable Lymph: No cervical or inguinal lymphadenopathy. Skin: No rashes, bruises or suspicious lesions. Neurologic: Grossly intact, no focal deficits, moving all 4 extremities. Psychiatric: Normal mood and affect.     ASSESSMENT/PLAN:   1. T2 intermediate (unfavorable) risk prostate cancer   Storage related void and symptoms which are bothersome lower presents for retreatment  Myrbetriq samples 25 mg daily x4 weeks  Instructed to call back regarding efficacy  PSA today  Leuprolide given today  Leuprolide recommended 18-24 months  Follow-up 6 months for office visit, PSA, leuprolide   Abbie Sons, [place] [address], [place], [place] [address] [phone]  By signing my name below, I, [employer], attest that this documentation has been prepared under the direction and in the presence of John Giovanni, MD. Electronically Signed: Abbie Sons, MD 09/08/19, 12:41 PM   I have reviewed the above documentation for accuracy and completeness, and I agree with the above.   Abbie Sons, MD

## 2019-09-08 NOTE — Progress Notes (Signed)
Eligard SubQ Injection   Due to Prostate Cancer patient is present today for a Eligard Injection.  Medication: Eligard 6 month Dose: 45 mg  Location: right upper outer glute Lot: [id number] Exp: 03/2021  Patient tolerated well, no complications were noted  Performed by: Bradly Bienenstock, CMA  Per Dr. Bernardo Heater patient is to continue therapy until June 2022. Patient's next follow up was scheduled for 03/05/2020. This appointment was scheduled using wheel and given to patient today along with reminder continue on Vitamin D 800-1000iu and Calium 1000-1200mg  daily while on Androgen Deprivation Therapy.   PA approval dates: NO PA req'd per [facility], call ref # [id number]

## 2019-09-09 ENCOUNTER — Telehealth: Payer: Self-pay | Admitting: *Deleted

## 2019-09-09 LAB — PSA: Prostate Specific Ag, Serum: <0.1 ng/mL (ref 0.0–4.0)

## 2019-09-09 NOTE — Telephone Encounter (Signed)
-----   Message from Abbie Sons, MD sent at 09/09/2019  1:59 PM EDT ----- PSA remains undetectable at <0.1

## 2019-09-09 NOTE — Telephone Encounter (Signed)
Notified patient as instructed, patient pleased. Discussed follow-up appointments, patient agrees  

## 2019-10-15 ENCOUNTER — Other Ambulatory Visit (INDEPENDENT_AMBULATORY_CARE_PROVIDER_SITE_OTHER): Payer: Self-pay | Admitting: Vascular Surgery

## 2019-10-15 ENCOUNTER — Encounter (INDEPENDENT_AMBULATORY_CARE_PROVIDER_SITE_OTHER): Payer: Medicare HMO

## 2019-10-15 DIAGNOSIS — I70219 Atherosclerosis of native arteries of extremities with intermittent claudication, unspecified extremity: Secondary | ICD-10-CM

## 2019-10-16 ENCOUNTER — Other Ambulatory Visit: Payer: Self-pay

## 2019-10-16 ENCOUNTER — Telehealth (INDEPENDENT_AMBULATORY_CARE_PROVIDER_SITE_OTHER): Payer: Self-pay | Admitting: Vascular Surgery

## 2019-10-16 ENCOUNTER — Encounter (INDEPENDENT_AMBULATORY_CARE_PROVIDER_SITE_OTHER): Payer: Self-pay | Admitting: Nurse Practitioner

## 2019-10-16 ENCOUNTER — Ambulatory Visit (INDEPENDENT_AMBULATORY_CARE_PROVIDER_SITE_OTHER): Payer: Medicare HMO | Admitting: Nurse Practitioner

## 2019-10-16 ENCOUNTER — Ambulatory Visit (INDEPENDENT_AMBULATORY_CARE_PROVIDER_SITE_OTHER): Payer: Medicare HMO

## 2019-10-16 VITALS — BP 118/76 | HR 64 | Resp 16 | Wt 138.6 lb

## 2019-10-16 DIAGNOSIS — I70219 Atherosclerosis of native arteries of extremities with intermittent claudication, unspecified extremity: Secondary | ICD-10-CM | POA: Diagnosis not present

## 2019-10-16 DIAGNOSIS — E785 Hyperlipidemia, unspecified: Secondary | ICD-10-CM

## 2019-10-16 MED ORDER — CLOPIDOGREL BISULFATE 75 MG PO TABS: 75.0000 mg | ORAL_TABLET | Freq: Every day | ORAL | 3 refills | Status: DC

## 2019-10-16 MED ORDER — ASPIRIN EC 81 MG PO TBEC: 81.0000 mg | DELAYED_RELEASE_TABLET | Freq: Every day | ORAL | 11 refills | Status: DC

## 2019-10-16 NOTE — Telephone Encounter (Signed)
Wife called stating that he receives his medication through the mail by [facility]. Patient was seen today and provider told him they would [facility].

## 2019-10-17 ENCOUNTER — Encounter (INDEPENDENT_AMBULATORY_CARE_PROVIDER_SITE_OTHER): Payer: Self-pay | Admitting: Nurse Practitioner

## 2019-10-17 MED ORDER — CLOPIDOGREL BISULFATE 75 MG PO TABS: 75.0000 mg | ORAL_TABLET | Freq: Every day | ORAL | 0 refills | Status: DC

## 2019-10-17 MED ORDER — ATORVASTATIN CALCIUM 10 MG PO TABS: 10.0000 mg | ORAL_TABLET | Freq: Every day | ORAL | 3 refills | Status: DC

## 2019-10-17 MED ORDER — ATORVASTATIN CALCIUM 10 MG PO TABS: 10.0000 mg | ORAL_TABLET | Freq: Every day | ORAL | 0 refills | Status: DC

## 2019-10-17 NOTE — Telephone Encounter (Signed)
A message was left on patient voicemail that medication has been sent to pharmacy

## 2019-10-17 NOTE — Telephone Encounter (Signed)
I sent one year refills to [facility] but a [facility] day supply of [facility]

## 2019-10-20 NOTE — Progress Notes (Signed)
Subjective:    Patient ID: Jerry [name], male    DOB: Mar 03, 1946, 74 y.o.   MRN: [id number] Chief Complaint  Patient presents with  . Follow-up    The patient returns to the office for followup and review of the noninvasive studies. There have been no interval changes in lower extremity symptoms. No interval shortening of the patient's claudication distance or development of rest pain symptoms. No new ulcers or wounds have occurred since the last visit.  There have been no significant changes to the patient's overall health care.  Patient has had several previous interventions.  The patient has a history of a femorofemoral bypass graft with subsequent occlusion per previous duplex and angiogram.  On 09/19/2017 he underwent a right external iliac artery angioplasty.  In 10/03/2017 the patient underwent bilateral proximal common iliac kissing stents with additional bilateral common iliac artery and left iliac artery stents.  The patient denies amaurosis fugax or recent TIA symptoms. There are no recent neurological changes noted. The patient denies history of DVT, PE or superficial thrombophlebitis. The patient denies recent episodes of angina or shortness of breath.   ABI Rt=1.02 and Lt=1.20  (previous ABI's Rt=0.71 and Lt=0.86) Duplex ultrasound of the triphasic waveforms of the right tibial arteries with biphasic/triphasic waveforms of the left tibial arteries.  Good toe waveforms bilaterally.   Review of Systems  Cardiovascular: Negative for leg swelling.  All other systems reviewed and are negative.      Objective:   Physical Exam Vitals reviewed.  HENT:     Head: Normocephalic.  Cardiovascular:     Rate and Rhythm: Normal rate and regular rhythm.     Pulses: Normal pulses.     Heart sounds: Normal heart sounds.  Pulmonary:     Effort: Pulmonary effort is normal.     Breath sounds: Normal breath sounds.  Skin:    General: Skin is warm and dry.  Neurological:     Mental  Status: He is alert and oriented to person, place, and time.  Psychiatric:        Mood and Affect: Mood normal.        Behavior: Behavior normal.        Thought Content: Thought content normal.        Judgment: Judgment normal.     BP 118/76 (BP Location: Right Arm)   Pulse 64   Resp 16   Wt 138 lb 9.6 oz (62.9 kg)   BMI 23.06 kg/m   Past Medical History:  Diagnosis Date  . Anemia    patient unaware of this diagnosis  . Cigarette smoker   . Hearing loss   . Hyperlipidemia   . Leg DVT (deep venous thromboembolism), acute, left ([place])   . Peripheral vascular disease (HCC)    blockages in both extremities  . Vitamin D deficiency    patient unaware of this diagnosis    Social History   Socioeconomic History  . Marital status: Married    Spouse name: Not on file  . Number of children: 6  . Years of education: Not on file  . Highest education level: Not on file  Occupational History  . Not on file  Tobacco Use  . Smoking status: Former Smoker    Packs/day: 1.00    Years: 40.00    Pack years: 40.00    Types: Cigarettes    Quit date: 12/08/2017    Years since quitting: 1.8  . Smokeless tobacco: Never Used  .  Tobacco comment: pt states smoke 3 cigarettes a day  Vaping Use  . Vaping Use: Never used  Substance and Sexual Activity  . Alcohol use: Never  . Drug use: Never  . Sexual activity: Not Currently  Other Topics Concern  . Not on file  Social History Narrative  . Not on file   Social Determinants of Health   Financial Resource Strain:   . Difficulty of Paying Living Expenses:   Food Insecurity:   . Worried About Charity fundraiser in the Last Year:   . Arboriculturist in the Last Year:   Transportation Needs:   . Film/video editor (Medical):   Marland Kitchen Lack of Transportation (Non-Medical):   Physical Activity:   . Days of Exercise per Week:   . Minutes of Exercise per Session:   Stress:   . Feeling of Stress :   Social Connections:   . Frequency of  Communication with Friends and Family:   . Frequency of Social Gatherings with Friends and Family:   . Attends Religious Services:   . Active Member of Clubs or Organizations:   . Attends Archivist Meetings:   Marland Kitchen Marital Status:   Intimate Partner Violence:   . Fear of Current or Ex-Partner:   . Emotionally Abused:   Marland Kitchen Physically Abused:   . Sexually Abused:     Past Surgical History:  Procedure Laterality Date  . ANGIOPLASTY ILLIAC ARTERY Left 10/03/2017   Procedure: ANGIOPLASTY ILIAC ARTERY;  Surgeon: Algernon Huxley, MD;  Location: [facility];  Service: Vascular;  Laterality: Left;  . BACK SURGERY  1990   Three sx in lower back. metal in lower back  . COLONOSCOPY  01/2010  . ENDARTERECTOMY FEMORAL Left 10/03/2017   Procedure: ENDARTERECTOMY FEMORAL;  Surgeon: Algernon Huxley, MD;  Location: [facility];  Service: Vascular;  Laterality: Left;  . Left Leg stent Left 2009   fem fem bypass  . LOWER EXTREMITY ANGIOGRAPHY Left 09/19/2017   Procedure: LOWER EXTREMITY ANGIOGRAPHY;  Surgeon: Algernon Huxley, MD;  Location: [place] [facility];  Service: Cardiovascular;  Laterality: Left;  . LOWER EXTREMITY ANGIOGRAPHY Right 01/07/2018   Procedure: LOWER EXTREMITY ANGIOGRAPHY;  Surgeon: Algernon Huxley, MD;  Location: [place] [facility];  Service: Cardiovascular;  Laterality: Right;  . PTCA Right 08/2017  . VASCULAR SURGERY Left 2009   fem fem bypass     Family History  Problem Relation Age of Onset  . Colon cancer Neg Hx     No Known Allergies     Assessment & Plan:   1. Atherosclerosis of lower extremity with claudication ([place])  Recommend:  The patient has evidence of atherosclerosis of the lower extremities with claudication.  The patient does not voice lifestyle limiting changes at this point in time.  Noninvasive studies do not suggest clinically significant change.  No invasive studies, angiography or surgery at this time The patient should continue walking and begin a more  formal exercise program.  The patient should continue antiplatelet therapy and aggressive treatment of the lipid abnormalities  No changes in the patient's medications at this time  The patient should continue wearing graduated compression socks 10-15 mmHg strength to control the mild edema.   Patient will follow up in 1 year with noninvasive studies - clopidogrel (PLAVIX) 75 MG tablet; Take 1 tablet (75 mg total) by mouth daily.  Dispense: 90 tablet; Refill: 3 - aspirin EC 81 MG tablet; Take 1 tablet (81  mg total) by mouth daily.  Dispense: 30 tablet; Refill: 11  2. Hyperlipidemia, unspecified hyperlipidemia type Continue statin as ordered and reviewed, no changes at this time    Current Outpatient Medications on File Prior to Visit  Medication Sig Dispense Refill  . acetaminophen (TYLENOL) 325 MG tablet Take 650 mg by mouth every 6 (six) hours as needed for moderate pain.    . Calcium Carbonate (CALCIUM 600 PO) Take by mouth daily.    . cholecalciferol (VITAMIN D) 1000 units tablet Take 1,000 Units by mouth daily.    Marland Kitchen ibuprofen (ADVIL,MOTRIN) 600 MG tablet Take 1 tablet (600 mg total) by mouth every 8 (eight) hours as needed. 15 tablet 0  . Multiple Vitamin (MULTIVITAMIN) capsule Take 1 capsule by mouth as needed.      No current facility-administered medications on file prior to visit.    There are no Patient Instructions on file for this visit. No follow-ups on file.   Kris Hartmann, NP

## 2019-10-30 ENCOUNTER — Other Ambulatory Visit (INDEPENDENT_AMBULATORY_CARE_PROVIDER_SITE_OTHER): Payer: Self-pay | Admitting: Nurse Practitioner

## 2019-11-09 ENCOUNTER — Other Ambulatory Visit (INDEPENDENT_AMBULATORY_CARE_PROVIDER_SITE_OTHER): Payer: Self-pay | Admitting: Nurse Practitioner

## 2019-11-13 ENCOUNTER — Telehealth: Payer: Self-pay

## 2019-11-13 NOTE — Telephone Encounter (Signed)
Per pt's insurance we are out of network, however pt's previous Eligard covered. Per Sharyn Lull and Judson Roch ok to proceed with giving.

## 2019-11-20 ENCOUNTER — Other Ambulatory Visit (INDEPENDENT_AMBULATORY_CARE_PROVIDER_SITE_OTHER): Payer: Self-pay | Admitting: Nurse Practitioner

## 2020-01-27 ENCOUNTER — Other Ambulatory Visit (INDEPENDENT_AMBULATORY_CARE_PROVIDER_SITE_OTHER): Payer: Self-pay | Admitting: Nurse Practitioner

## 2020-02-10 ENCOUNTER — Other Ambulatory Visit (INDEPENDENT_AMBULATORY_CARE_PROVIDER_SITE_OTHER): Payer: Self-pay | Admitting: Nurse Practitioner

## 2020-02-10 NOTE — Telephone Encounter (Signed)
Are these ok to fill 90 days at a time?

## 2020-03-05 ENCOUNTER — Encounter: Payer: Self-pay | Admitting: Urology

## 2020-03-05 ENCOUNTER — Ambulatory Visit: Payer: Medicare HMO | Admitting: Urology

## 2020-04-19 ENCOUNTER — Telehealth: Payer: Self-pay | Admitting: Urology

## 2020-04-19 NOTE — Telephone Encounter (Signed)
Office visit and mediations are two different thing. The office visit is covered . Thanks

## 2020-04-19 NOTE — Telephone Encounter (Signed)
Jerry [name] missed his follow up with Dr. Bernardo Heater in December, but there is a note in the chart on 11/13/2019 from [facility] stating that we are no longer in his network.  If this is true, we need to get him to an urologist in his network.

## 2020-04-19 NOTE — Telephone Encounter (Signed)
Then we need to get him in for an appointment with Dr. Bernardo Heater.

## 2020-05-27 ENCOUNTER — Other Ambulatory Visit: Payer: Self-pay

## 2020-05-27 ENCOUNTER — Ambulatory Visit (INDEPENDENT_AMBULATORY_CARE_PROVIDER_SITE_OTHER): Payer: Medicare HMO | Admitting: Urology

## 2020-05-27 ENCOUNTER — Encounter: Payer: Self-pay | Admitting: Urology

## 2020-05-27 VITALS — BP 123/76 | HR 71 | Ht 65.0 in | Wt 132.0 lb

## 2020-05-27 DIAGNOSIS — R350 Frequency of micturition: Secondary | ICD-10-CM | POA: Diagnosis not present

## 2020-05-27 DIAGNOSIS — N3941 Urge incontinence: Secondary | ICD-10-CM

## 2020-05-27 DIAGNOSIS — C61 Malignant neoplasm of prostate: Secondary | ICD-10-CM | POA: Diagnosis not present

## 2020-05-27 MED ORDER — SOLIFENACIN SUCCINATE 5 MG PO TABS
5.0000 mg | ORAL_TABLET | Freq: Every day | ORAL | 0 refills | Status: DC
Start: 2020-05-27 — End: 2022-04-18

## 2020-05-27 NOTE — Progress Notes (Signed)
05/27/2020 11:31 AM   Jerry [name] 1946/03/07 [id number]  Referring provider: Marguerita Merles, [place],   [id number]  Chief Complaint  Patient presents with  . Prostate Cancer    Urologic history: 1.  Prostate cancer -T2 intermediate (unfavorable) risk prostate cancer -Left prostate nodule and PI-RADS 5 lesion on MRI -Fusion biopsy 04/2018; ROI positive Gleason 4+3 -Additional positive cores on standard template 3+/4+3 -IMRT + ADT; IMRT completed 08/2018; first leuprolide 06/2018 (18 months ADT with final injection June 2021)  HPI: 75 y.o. male presents for follow-up.   Last seen June 2021 and complaining of frequency, urgency and incontinence; has trouble retracting his foreskin which exacerbates the problem  Was given a trial of Myrbetriq which he did not feel was beneficial  Last leuprolide June 2021; he was unable to receive additional injections apparently because leuprolide was out of network based on his insurance  Denies dysuria, gross hematuria   PMH: Past Medical History:  Diagnosis Date  . Anemia    patient unaware of this diagnosis  . Cigarette smoker   . Hearing loss   . Hyperlipidemia   . Leg DVT (deep venous thromboembolism), acute, left ([place])   . Peripheral vascular disease (HCC)    blockages in both extremities  . Vitamin D deficiency    patient unaware of this diagnosis    Surgical History: Past Surgical History:  Procedure Laterality Date  . ANGIOPLASTY ILLIAC ARTERY Left 10/03/2017   Procedure: ANGIOPLASTY ILIAC ARTERY;  Surgeon: Algernon Huxley, MD;  Location: [facility];  Service: Vascular;  Laterality: Left;  . BACK SURGERY  1990   Three sx in lower back. metal in lower back  . COLONOSCOPY  01/2010  . ENDARTERECTOMY FEMORAL Left 10/03/2017   Procedure: ENDARTERECTOMY FEMORAL;  Surgeon: Algernon Huxley, MD;  Location: [facility];  Service: Vascular;  Laterality: Left;  . Left Leg stent Left 2009   fem fem bypass  . LOWER  EXTREMITY ANGIOGRAPHY Left 09/19/2017   Procedure: LOWER EXTREMITY ANGIOGRAPHY;  Surgeon: Algernon Huxley, MD;  Location: [place] [facility];  Service: Cardiovascular;  Laterality: Left;  . LOWER EXTREMITY ANGIOGRAPHY Right 01/07/2018   Procedure: LOWER EXTREMITY ANGIOGRAPHY;  Surgeon: Algernon Huxley, MD;  Location: [place] [facility];  Service: Cardiovascular;  Laterality: Right;  . PTCA Right 08/2017  . VASCULAR SURGERY Left 2009   fem fem bypass     Home Medications:  Allergies as of 05/27/2020   No Known Allergies     Medication List       Accurate as of May 27, 2020 11:31 AM. If you have any questions, ask your nurse or doctor.        acetaminophen 325 MG tablet Commonly known as: TYLENOL Take 650 mg by mouth every 6 (six) hours as needed for moderate pain.   aspirin EC 81 MG tablet Take 1 tablet (81 mg total) by mouth daily.   atorvastatin 10 MG tablet Commonly known as: LIPITOR Take 1 tablet (10 mg total) by mouth daily.   atorvastatin 10 MG tablet Commonly known as: LIPITOR TAKE 1 TABLET BY MOUTH EVERY DAY   CALCIUM 600 PO Take by mouth daily.   cholecalciferol 1000 units tablet Commonly known as: VITAMIN D Take 1,000 Units by mouth daily.   clopidogrel 75 MG tablet Commonly known as: PLAVIX Take 1 tablet (75 mg total) by mouth daily.   clopidogrel 75 MG tablet Commonly known as: PLAVIX TAKE 1  TABLET BY MOUTH EVERY DAY   ibuprofen 600 MG tablet Commonly known as: ADVIL Take 1 tablet (600 mg total) by mouth every 8 (eight) hours as needed.   multivitamin capsule Take 1 capsule by mouth as needed.       Allergies: No Known Allergies  Family History: Family History  Problem Relation Age of Onset  . Colon cancer Neg Hx     Social History:  reports that he quit smoking about 2 years ago. His smoking use included cigarettes. He has a 40.00 pack-year smoking history. He has never used smokeless tobacco. He reports that he does not drink alcohol and  does not use drugs.   Physical Exam: BP 123/76   Pulse 71   Ht 5\' 5"  (1.651 m)   Wt 132 lb (59.9 kg)   BMI 21.97 kg/m   Constitutional:  Alert and oriented, No acute distress. HEENT: [place] AT, moist mucus membranes.  Trachea midline, no masses. Cardiovascular: No clubbing, cyanosis, or edema. Respiratory: Normal respiratory effort, no increased work of breathing.   Assessment & Plan:    1.  T2 intermediate risk prostate cancer  PSA drawn today  18-24 months of leuprolide was recommended by radiation oncology and he did receive 18 months  PSA drawn today  2.  Lower urinary tract symptoms  Storage related voiding symptoms which were present pretreatment and exacerbated posttreatment  No improvement with Myrbetriq  Trial solifenacin 5 mg-Rx sent to pharmacy  We discussed that a circumcision or dorsal slit would most likely not resolve his urinary symptoms   Abbie Sons, MD  [place] [address] [place], [place] [address] [phone]

## 2020-05-28 ENCOUNTER — Encounter: Payer: Self-pay | Admitting: Urology

## 2020-05-28 ENCOUNTER — Telehealth: Payer: Self-pay | Admitting: *Deleted

## 2020-05-28 LAB — PSA: Prostate Specific Ag, Serum: <0.1 ng/mL (ref 0.0–4.0)

## 2020-05-28 NOTE — Telephone Encounter (Signed)
-----   Message from Abbie Sons, MD sent at 05/28/2020  1:13 PM EST ----- PSA remains undetectable at <0.1

## 2020-05-28 NOTE — Telephone Encounter (Signed)
Left on voicemail

## 2020-07-21 IMAGING — MR MR PROSTATE WO/W CM
56 series · 56 of 56 positions shown · IV contrast (Multihance 12ml)
Comparison: 07/18/2011 stone study.

Addendum:
CLINICAL DATA: PSA the of 3.7. Prior benign workup. Prostate
nodule.

EXAM:
MR PROSTATE WITHOUT AND WITH CONTRAST
TECHNIQUE: Multiplanar multisequence MRI images were obtained of the pelvis
centered about the prostate. Pre and post contrast images were
obtained.
CONTRAST:  12mL MULTIHANCE GADOBENATE DIMEGLUMINE 529 MG/ML IV SOLN

[Series 3: T1 · axial · 8.0mm · 1.06mm/px · 1 of 28 slices shown (1 of 2)]
[im 1/28]
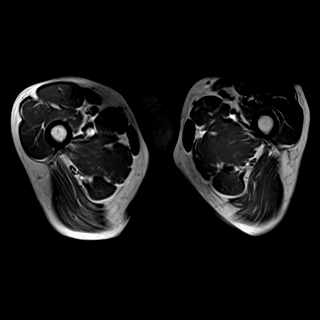

[Series 4: bSSFP fat-sat · axial · 8.0mm · 0.74mm/px · 1 of 28 slices shown]
[im 1/28]
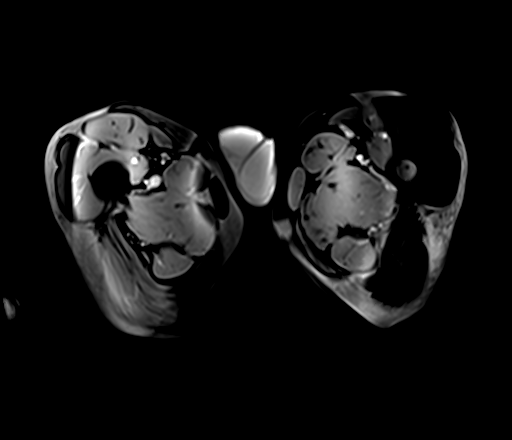

[Series 5: T2 · sagittal · 3.5mm · 0.56mm/px · 1 of 39 slices shown (1 of 4)]
[im 1/39]
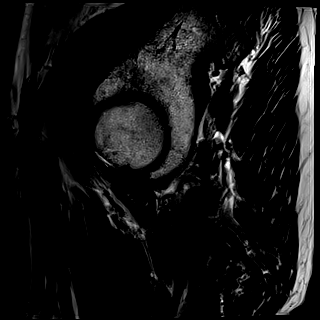

[Series 6: T2 · axial · 1.0mm · 1.04mm/px · 1 of 80 slices shown (2 of 4)]
[im 1/80]
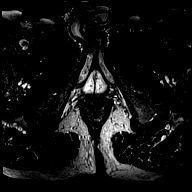

[Series 7: T1 · axial · 3.0mm · 0.31mm/px · 1 of 24 slices shown (2 of 2)]
[im 1/24]
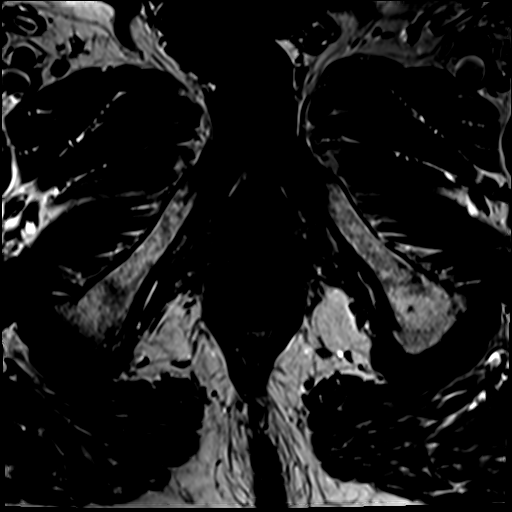

[Series 8: T2 · axial · 3.5mm · 0.56mm/px · 1 of 23 slices shown (3 of 4)]
[im 1/23]
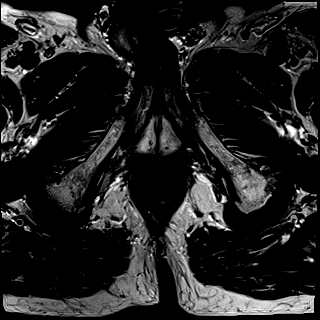

[Series 9: T2 · coronal · 3.5mm · 0.56mm/px · 1 of 23 slices shown (4 of 4)]
[im 1/23]
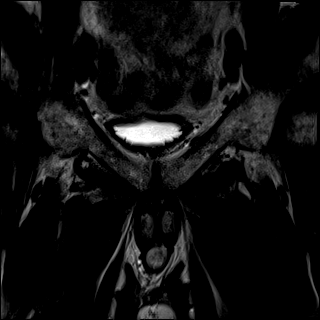

[Series 10: DWI · axial · 3.5mm · 1.56mm/px · 1 of 59 slices shown (1 of 2)]
[im 1/59]
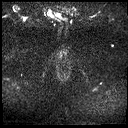

[Series 11: DWI · axial · 3.5mm · 1.56mm/px · 1 of 20 slices shown (2 of 2)]
[im 1/20]
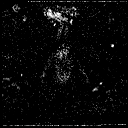

[Series 12: pre t1_twist_tra_dyn_ttc=5.3s · axial · non-contrast · 3.5mm · 0.83mm/px · 1 of 20 slices shown]
[im 1/20]
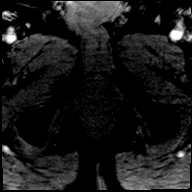

[Series 13: post t1_twist_tra_dyn-copy center · axial · 3.5mm · 0.83mm/px · 1 of 20 slices shown (1 of 24)]
[im 1/20]
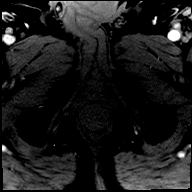

[Series 14: post t1_twist_tra_dyn-copy center · axial · 3.5mm · 0.83mm/px · 1 of 20 slices shown (2 of 24)]
[im 1/20]
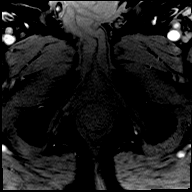

[Series 15: post t1_twist_tra_dyn-copy cent_sub_ttc=(id) · axial · 3.5mm · 0.83mm/px · 1 of 20 slices shown (1 of 22)]
[im 1/20]
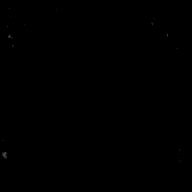

[Series 16: post t1_twist_tra_dyn-copy center · axial · 3.5mm · 0.83mm/px · 1 of 20 slices shown (3 of 24)]
[im 1/20]
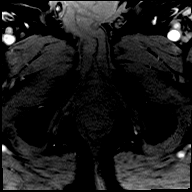

[Series 17: post t1_twist_tra_dyn-copy cent_sub_ttc=(id) · axial · 3.5mm · 0.83mm/px · 1 of 20 slices shown (2 of 22)]
[im 1/20]
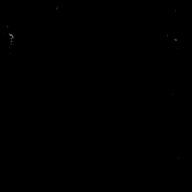

[Series 18: post t1_twist_tra_dyn-copy center · axial · 3.5mm · 0.83mm/px · 1 of 20 slices shown (4 of 24)]
[im 1/20]
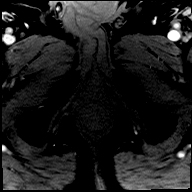

[Series 19: post t1_twist_tra_dyn-copy cent_sub_ttc=(id) · axial · 3.5mm · 0.83mm/px · 1 of 20 slices shown (3 of 22)]
[im 1/20]
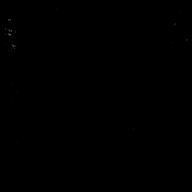

[Series 20: post t1_twist_tra_dyn-copy center · axial · 3.5mm · 0.83mm/px · 1 of 20 slices shown (5 of 24)]
[im 1/20]
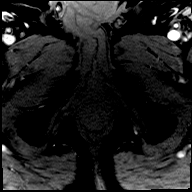

[Series 21: post t1_twist_tra_dyn-copy cent_sub_ttc=(id) · axial · 3.5mm · 0.83mm/px · 1 of 20 slices shown (4 of 22)]
[im 1/20]
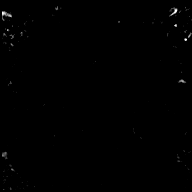

[Series 22: post t1_twist_tra_dyn-copy center · axial · 3.5mm · 0.83mm/px · 1 of 20 slices shown (6 of 24)]
[im 1/20]
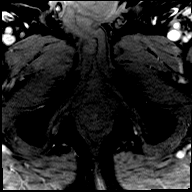

[Series 23: post t1_twist_tra_dyn-copy cent_sub_ttc=(id) · axial · 3.5mm · 0.83mm/px · 1 of 20 slices shown (5 of 22)]
[im 1/20]
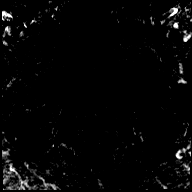

[Series 24: post t1_twist_tra_dyn-copy center · axial · 3.5mm · 0.83mm/px · 1 of 20 slices shown (7 of 24)]
[im 1/20]
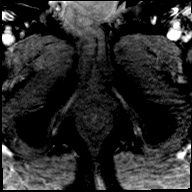

[Series 25: post t1_twist_tra_dyn-copy cent_sub_ttc=(id) · axial · 3.5mm · 0.83mm/px · 1 of 20 slices shown (6 of 22)]
[im 1/20]
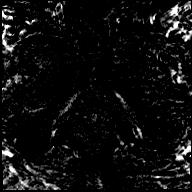

[Series 26: post t1_twist_tra_dyn-copy center · axial · 3.5mm · 0.83mm/px · 1 of 20 slices shown (8 of 24)]
[im 1/20]
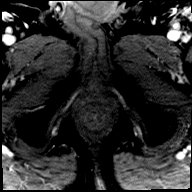

[Series 27: post t1_twist_tra_dyn-copy cent_sub_ttc=(id) · axial · 3.5mm · 0.83mm/px · 1 of 20 slices shown (7 of 22)]
[im 1/20]
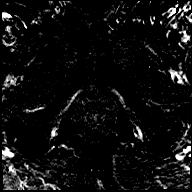

[Series 28: post t1_twist_tra_dyn-copy center · axial · 3.5mm · 0.83mm/px · 1 of 20 slices shown (9 of 24)]
[im 1/20]
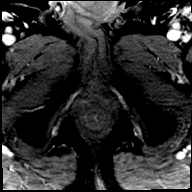

[Series 29: post t1_twist_tra_dyn-copy cent_sub_ttc=(id) · axial · 3.5mm · 0.83mm/px · 1 of 20 slices shown (8 of 22)]
[im 1/20]
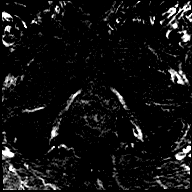

[Series 30: post t1_twist_tra_dyn-copy center · axial · 3.5mm · 0.83mm/px · 1 of 20 slices shown (10 of 24)]
[im 1/20]
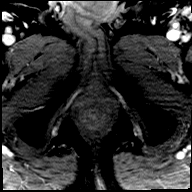

[Series 31: post t1_twist_tra_dyn-copy cent_sub_ttc=(id) · axial · 3.5mm · 0.83mm/px · 1 of 20 slices shown (9 of 22)]
[im 1/20]
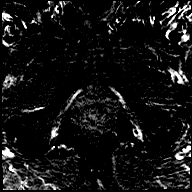

[Series 32: post t1_twist_tra_dyn-copy center · axial · 3.5mm · 0.83mm/px · 1 of 20 slices shown (11 of 24)]
[im 1/20]
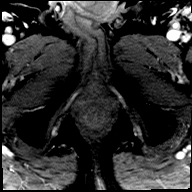

[Series 33: post t1_twist_tra_dyn-copy cent_sub_ttc=(id) · axial · 3.5mm · 0.83mm/px · 1 of 20 slices shown (10 of 22)]
[im 1/20]
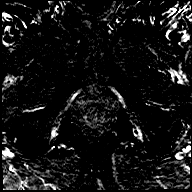

[Series 34: post t1_twist_tra_dyn-copy center · axial · 3.5mm · 0.83mm/px · 1 of 20 slices shown (12 of 24)]
[im 1/20]
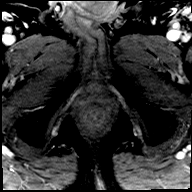

[Series 35: post t1_twist_tra_dyn-copy cent_sub_ttc=(id) · axial · 3.5mm · 0.83mm/px · 1 of 20 slices shown (11 of 22)]
[im 1/20]
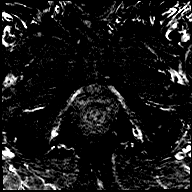

[Series 36: post t1_twist_tra_dyn-copy center · axial · 3.5mm · 0.83mm/px · 1 of 20 slices shown (13 of 24)]
[im 1/20]
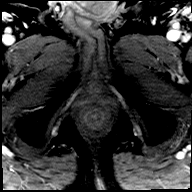

[Series 37: post t1_twist_tra_dyn-copy cent_sub_ttc=(id) · axial · 3.5mm · 0.83mm/px · 1 of 20 slices shown (12 of 22)]
[im 1/20]
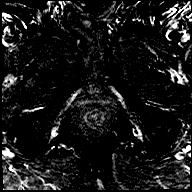

[Series 38: post t1_twist_tra_dyn-copy center · axial · 3.5mm · 0.83mm/px · 1 of 20 slices shown (14 of 24)]
[im 1/20]
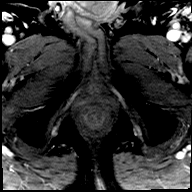

[Series 39: post t1_twist_tra_dyn-copy cent_sub_ttc=(id) · axial · 3.5mm · 0.83mm/px · 1 of 20 slices shown (13 of 22)]
[im 1/20]
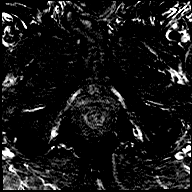

[Series 40: post t1_twist_tra_dyn-copy center · axial · 3.5mm · 0.83mm/px · 1 of 20 slices shown (15 of 24)]
[im 1/20]
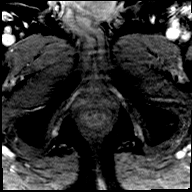

[Series 41: post t1_twist_tra_dyn-copy cent_sub_ttc=(id) · axial · 3.5mm · 0.83mm/px · 1 of 20 slices shown (14 of 22)]
[im 1/20]
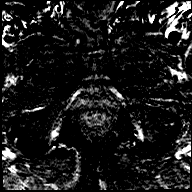

[Series 42: post t1_twist_tra_dyn-copy center · axial · 3.5mm · 0.83mm/px · 1 of 20 slices shown (16 of 24)]
[im 1/20]
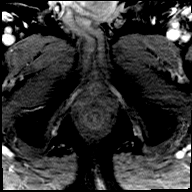

[Series 43: post t1_twist_tra_dyn-copy cent_sub_ttc=(id) · axial · 3.5mm · 0.83mm/px · 1 of 20 slices shown (15 of 22)]
[im 1/20]
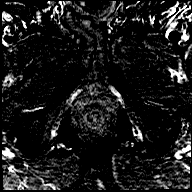

[Series 44: post t1_twist_tra_dyn-copy center · axial · 3.5mm · 0.83mm/px · 1 of 20 slices shown (17 of 24)]
[im 1/20]
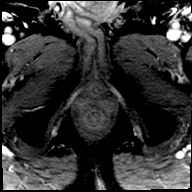

[Series 45: post t1_twist_tra_dyn-copy cent_sub_ttc=(id) · axial · 3.5mm · 0.83mm/px · 1 of 20 slices shown (16 of 22)]
[im 1/20]
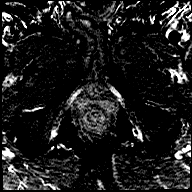

[Series 46: post t1_twist_tra_dyn-copy center · axial · 3.5mm · 0.83mm/px · 1 of 20 slices shown (18 of 24)]
[im 1/20]
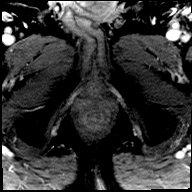

[Series 47: post t1_twist_tra_dyn-copy cent_sub_ttc=(id) · axial · 3.5mm · 0.83mm/px · 1 of 20 slices shown (17 of 22)]
[im 1/20]
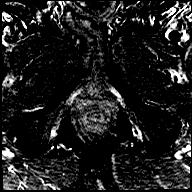

[Series 48: post t1_twist_tra_dyn-copy center · axial · 3.5mm · 0.83mm/px · 1 of 20 slices shown (19 of 24)]
[im 1/20]
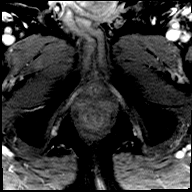

[Series 49: post t1_twist_tra_dyn-copy cent_sub_ttc=(id) · axial · 3.5mm · 0.83mm/px · 1 of 20 slices shown (18 of 22)]
[im 1/20]
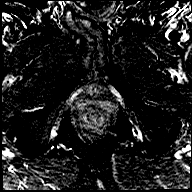

[Series 50: post t1_twist_tra_dyn-copy center · axial · 3.5mm · 0.83mm/px · 1 of 20 slices shown (20 of 24)]
[im 1/20]
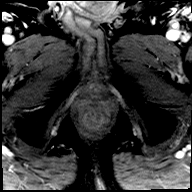

[Series 51: post t1_twist_tra_dyn-copy cent_sub_ttc=(id) · axial · 3.5mm · 0.83mm/px · 1 of 20 slices shown (19 of 22)]
[im 1/20]
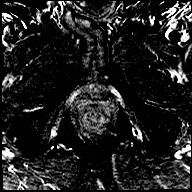

[Series 52: post t1_twist_tra_dyn-copy center · axial · 3.5mm · 0.83mm/px · 1 of 20 slices shown (21 of 24)]
[im 1/20]
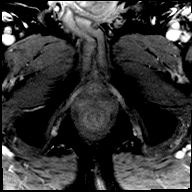

[Series 53: post t1_twist_tra_dyn-copy cent_sub_ttc=(id) · axial · 3.5mm · 0.83mm/px · 1 of 20 slices shown (20 of 22)]
[im 1/20]
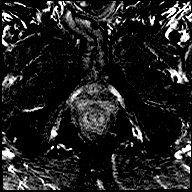

[Series 54: post t1_twist_tra_dyn-copy center · axial · 3.5mm · 0.83mm/px · 1 of 20 slices shown (22 of 24)]
[im 1/20]
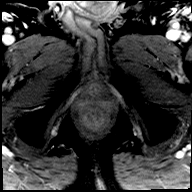

[Series 55: post t1_twist_tra_dyn-copy cent_sub_ttc=(id) · axial · 3.5mm · 0.83mm/px · 1 of 20 slices shown (21 of 22)]
[im 1/20]
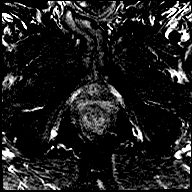

[Series 56: post t1_twist_tra_dyn-copy center · axial · 3.5mm · 0.83mm/px · 1 of 20 slices shown (23 of 24)]
[im 1/20]
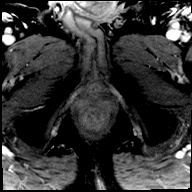

[Series 57: post t1_twist_tra_dyn-copy cent_sub_ttc=(id) · axial · 3.5mm · 0.83mm/px · 1 of 20 slices shown (22 of 22)]
[im 1/20]
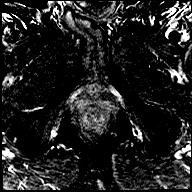

[Series 58: post t1_twist_tra_dyn-copy center · axial · 3.5mm · 0.83mm/px · 1 of 20 slices shown (24 of 24)]
[im 1/20]
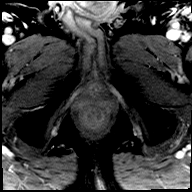

[56 of 56 positions shown; findings below may reference images not displayed]

FINDINGS: Prostate: Demonstrates a left mid gland peripheral zone 10 x 11 x 12
mm T2 hypointense nodule on image [DATE] and [DATE]. This corresponds to
restricted diffusion on image [DATE] and early post-contrast
enhancement on image [DATE].

Volume: 3.3 x 4.5 x 3.8 cm (volume = 30 cm^3)

Transcapsular spread: Equivocal. Although there is apparent
irregularity of the capsule on image [DATE], no correlate is seen on
sagittal series 5.

Seminal vesicle involvement: Absent

Neurovascular bundle involvement: Absent

Pelvic adenopathy: Absent

Bone metastasis: Absent

Other findings: No significant free fluid.

Evaluation of the upper and mid pelvis (majority of the
extraprostatic pelvis) limited secondary to susceptibility artifact
from lumbosacral spine hardware.
IMPRESSION: 1. Maximal 12 mm multiparametric signal abnormality within the left
mid gland peripheral zone, most consistent with macroscopic higher
grade carcinoma. [REDACTED]
2. Equivocal transcapsular spread as detailed above.
3. Limitation secondary to susceptibility artifact from lumbosacral
spine fixation.

ADDENDUM:
Impression 1 should state "PI-RADS(v2.1)-4."

*** End of Addendum ***

## 2020-08-02 ENCOUNTER — Other Ambulatory Visit: Admission: RE | Admit: 2020-08-02 | Payer: Medicare HMO | Source: Ambulatory Visit

## 2020-08-03 ENCOUNTER — Encounter: Payer: Self-pay | Admitting: Internal Medicine

## 2020-08-04 ENCOUNTER — Encounter: Admission: RE | Payer: Self-pay | Source: Home / Self Care

## 2020-08-04 ENCOUNTER — Ambulatory Visit: Admission: RE | Admit: 2020-08-04 | Payer: Medicare HMO | Source: Home / Self Care | Admitting: Internal Medicine

## 2020-08-04 HISTORY — DX: Right lower quadrant abdominal swelling, mass and lump: R19.03

## 2020-08-04 HISTORY — DX: Atherosclerosis of native arteries of extremities with intermittent claudication, unspecified extremity: I70.219

## 2020-08-04 HISTORY — DX: Abnormal electrocardiogram (ECG) (EKG): R94.31

## 2020-08-04 SURGERY — COLONOSCOPY WITH PROPOFOL
Anesthesia: General

## 2020-08-30 ENCOUNTER — Other Ambulatory Visit: Payer: Self-pay | Admitting: *Deleted

## 2020-08-30 ENCOUNTER — Ambulatory Visit: Payer: Self-pay

## 2020-08-30 DIAGNOSIS — C61 Malignant neoplasm of prostate: Secondary | ICD-10-CM

## 2020-10-11 ENCOUNTER — Other Ambulatory Visit (INDEPENDENT_AMBULATORY_CARE_PROVIDER_SITE_OTHER): Payer: Self-pay | Admitting: Nurse Practitioner

## 2020-10-11 DIAGNOSIS — I70213 Atherosclerosis of native arteries of extremities with intermittent claudication, bilateral legs: Secondary | ICD-10-CM

## 2020-10-12 ENCOUNTER — Ambulatory Visit (INDEPENDENT_AMBULATORY_CARE_PROVIDER_SITE_OTHER): Payer: Medicare HMO

## 2020-10-12 ENCOUNTER — Encounter (INDEPENDENT_AMBULATORY_CARE_PROVIDER_SITE_OTHER): Payer: Self-pay | Admitting: Vascular Surgery

## 2020-10-12 ENCOUNTER — Other Ambulatory Visit: Payer: Self-pay

## 2020-10-12 ENCOUNTER — Ambulatory Visit (INDEPENDENT_AMBULATORY_CARE_PROVIDER_SITE_OTHER): Payer: Medicare HMO | Admitting: Vascular Surgery

## 2020-10-12 VITALS — BP 137/70 | HR 58 | Resp 16 | Wt 135.0 lb

## 2020-10-12 DIAGNOSIS — I70219 Atherosclerosis of native arteries of extremities with intermittent claudication, unspecified extremity: Secondary | ICD-10-CM

## 2020-10-12 DIAGNOSIS — E785 Hyperlipidemia, unspecified: Secondary | ICD-10-CM

## 2020-10-12 DIAGNOSIS — I70213 Atherosclerosis of native arteries of extremities with intermittent claudication, bilateral legs: Secondary | ICD-10-CM | POA: Diagnosis not present

## 2020-10-12 NOTE — Assessment & Plan Note (Signed)
His ABIs today are 1.01 on the right and 1.03 on the left with brisk triphasic waveforms and normal digital pressures and waveforms bilaterally.  Doing well.  Continue aspirin, Plavix, and Lipitor.  Recheck in 1 year

## 2020-10-12 NOTE — Progress Notes (Signed)
MRN : [id number]  Jerry [name] is a 75 y.o. (1946-01-26) male who presents with chief complaint of  Chief Complaint  Patient presents with   Follow-up    Ultrasound follow up  .  History of Present Illness: Patient returns today in follow up of his PAD.  About 3 years ago, he ended went multiple revascularization procedures for severe peripheral arterial disease.  Overall, he is done pretty well since that time.  No current lifestyle limiting claudication, ischemic rest pain, or ulceration.  His ABIs today are 1.01 on the right and 1.03 on the left with brisk triphasic waveforms and normal digital pressures and waveforms bilaterally.  Current Outpatient Medications  Medication Sig Dispense Refill   acetaminophen (TYLENOL) 325 MG tablet Take 650 mg by mouth every 6 (six) hours as needed for moderate pain.     aspirin EC 81 MG tablet Take 1 tablet (81 mg total) by mouth daily. 30 tablet 11   atorvastatin (LIPITOR) 10 MG tablet Take 1 tablet (10 mg total) by mouth daily. 90 tablet 3   atorvastatin (LIPITOR) 10 MG tablet TAKE 1 TABLET BY MOUTH EVERY DAY 90 tablet 1   Calcium Carbonate (CALCIUM 600 PO) Take by mouth daily.     cholecalciferol (VITAMIN D) 1000 units tablet Take 1,000 Units by mouth daily.     clopidogrel (PLAVIX) 75 MG tablet Take 1 tablet (75 mg total) by mouth daily. 90 tablet 3   clopidogrel (PLAVIX) 75 MG tablet TAKE 1 TABLET BY MOUTH EVERY DAY 90 tablet 1   docusate sodium (COLACE) 100 MG capsule Take 100 mg by mouth 2 (two) times daily.     ibuprofen (ADVIL,MOTRIN) 600 MG tablet Take 1 tablet (600 mg total) by mouth every 8 (eight) hours as needed. 15 tablet 0   Multiple Vitamin (MULTIVITAMIN) capsule Take 1 capsule by mouth as needed.      solifenacin (VESICARE) 5 MG tablet Take 1 tablet (5 mg total) by mouth daily. 30 tablet 0   tamsulosin (FLOMAX) 0.4 MG CAPS capsule Take 0.4 mg by mouth.     No current facility-administered medications for this visit.    Past  Medical History:  Diagnosis Date   Abdominal wall mass of right lower quadrant    Abnormal finding on EKG    Anemia    patient unaware of this diagnosis   Atherosclerosis of lower extremity with claudication ([place])    Cigarette smoker    Hearing loss    Hyperlipidemia    Leg DVT (deep venous thromboembolism), acute, left (HCC)    Peripheral vascular disease (HCC)    blockages in both extremities   Vitamin D deficiency    patient unaware of this diagnosis    Past Surgical History:  Procedure Laterality Date   ANGIOPLASTY ILLIAC ARTERY Left 10/03/2017   Procedure: ANGIOPLASTY ILIAC ARTERY;  Surgeon: Algernon Huxley, MD;  Location: [facility];  Service: Vascular;  Laterality: Left;   BACK SURGERY  1990   Three sx in lower back. metal in lower back   COLONOSCOPY  01/2010   ENDARTERECTOMY FEMORAL Left 10/03/2017   Procedure: ENDARTERECTOMY FEMORAL;  Surgeon: Algernon Huxley, MD;  Location: [facility];  Service: Vascular;  Laterality: Left;   Left Leg stent Left 2009   fem fem bypass   LOWER EXTREMITY ANGIOGRAPHY Left 09/19/2017   Procedure: LOWER EXTREMITY ANGIOGRAPHY;  Surgeon: Algernon Huxley, MD;  Location: [place] [facility];  Service: Cardiovascular;  Laterality: Left;  LOWER EXTREMITY ANGIOGRAPHY Right 01/07/2018   Procedure: LOWER EXTREMITY ANGIOGRAPHY;  Surgeon: Algernon Huxley, MD;  Location: [place] [facility];  Service: Cardiovascular;  Laterality: Right;   PTCA Right 08/2017   VASCULAR SURGERY Left 2009   fem fem bypass      Social History   Tobacco Use   Smoking status: Former    Packs/day: 1.00    Years: 40.00    Pack years: 40.00    Types: Cigarettes    Quit date: 12/08/2017    Years since quitting: 2.8   Smokeless tobacco: Never   Tobacco comments:    pt states smoke 3 cigarettes a day  Vaping Use   Vaping Use: Never used  Substance Use Topics   Alcohol use: Never   Drug use: Never      Family History  Problem Relation Age of Onset   Colon cancer Neg Hx       No Known Allergies  REVIEW OF SYSTEMS (Negative unless checked)   Constitutional: [] Weight loss  [] Fever  [] Chills Cardiac: [] Chest pain   [] Chest pressure   [] Palpitations   [] Shortness of breath when laying flat   [] Shortness of breath at rest   [] Shortness of breath with exertion. Vascular:  [x] Pain in legs with walking   [] Pain in legs at rest   [] Pain in legs when laying flat   [x] Claudication   [] Pain in feet when walking  [] Pain in feet at rest  [] Pain in feet when laying flat   [] History of DVT   [] Phlebitis   [] Swelling in legs   [] Varicose veins   [] Non-healing ulcers Pulmonary:   [] Uses home oxygen   [] Productive cough   [] Hemoptysis   [] Wheeze  [] COPD   [] Asthma Neurologic:  [] Dizziness  [] Blackouts   [] Seizures   [] History of stroke   [] History of TIA  [] Aphasia   [] Temporary blindness   [] Dysphagia   [] Weakness or numbness in arms   [] Weakness or numbness in legs Musculoskeletal:  [] Arthritis   [] Joint swelling   [] Joint pain   [] Low back pain Hematologic:  [] Easy bruising  [] Easy bleeding   [] Hypercoagulable state   [] Anemic   Gastrointestinal:  [] Blood in stool   [] Vomiting blood  [] Gastroesophageal reflux/heartburn   [] Abdominal pain Genitourinary:  [] Chronic kidney disease   [] Difficult urination  [] Frequent urination  [] Burning with urination   [] Hematuria Skin:  [] Rashes   [] Ulcers   [] Wounds Psychological:  [] History of anxiety   []  History of major depression.  Physical Examination  BP 137/70 (BP Location: Right Arm)   Pulse (!) 58   Resp 16   Wt 135 lb (61.2 kg)   BMI 22.47 kg/m  Gen:  WD/WN, NAD Head: [place]/AT, No temporalis wasting. Ear/Nose/Throat: Hearing grossly intact, nares w/o erythema or drainage Eyes: Conjunctiva clear. Sclera non-icteric Neck: Supple.  Trachea midline Pulmonary:  Good air movement, no use of accessory muscles.  Cardiac: RRR, no JVD Vascular:  Vessel Right Left  Radial Palpable Palpable                          PT Palpable  Palpable  DP Palpable Palpable   Gastrointestinal: soft, non-tender/non-distended. No guarding/reflex.  Musculoskeletal: M/S 5/5 throughout.  No deformity or atrophy. No edema. Neurologic: Sensation grossly intact in extremities.  Symmetrical.  Speech is fluent.  Psychiatric: Judgment intact, Mood & affect appropriate for pt's clinical situation. Dermatologic: No rashes or ulcers noted.  No cellulitis or open  wounds.      Labs No results found for this or any previous visit (from the past 2160 hour(s)).  Radiology No results found.  Assessment/Plan Hyperlipidemia lipid control important in reducing the progression of atherosclerotic disease. Continue statin therapy  Atherosclerosis of lower extremity with claudication ([place]) His ABIs today are 1.01 on the right and 1.03 on the left with brisk triphasic waveforms and normal digital pressures and waveforms bilaterally.  Doing well.  Continue aspirin, Plavix, and Lipitor.  Recheck in 1 year    [name], MD  10/12/2020 3:33 PM    This note was created with Dragon medical transcription system.  Any errors from dictation are purely unintentional

## 2020-11-04 ENCOUNTER — Other Ambulatory Visit (INDEPENDENT_AMBULATORY_CARE_PROVIDER_SITE_OTHER): Payer: Self-pay | Admitting: Nurse Practitioner

## 2020-11-04 DIAGNOSIS — I70219 Atherosclerosis of native arteries of extremities with intermittent claudication, unspecified extremity: Secondary | ICD-10-CM

## 2020-11-26 ENCOUNTER — Other Ambulatory Visit: Payer: Medicare HMO

## 2020-11-26 ENCOUNTER — Other Ambulatory Visit: Payer: Self-pay

## 2020-11-26 DIAGNOSIS — C61 Malignant neoplasm of prostate: Secondary | ICD-10-CM

## 2020-11-27 LAB — PSA: Prostate Specific Ag, Serum: 0.2 ng/mL (ref 0.0–4.0)

## 2020-11-30 ENCOUNTER — Ambulatory Visit (INDEPENDENT_AMBULATORY_CARE_PROVIDER_SITE_OTHER): Payer: Medicare HMO | Admitting: Physician Assistant

## 2020-11-30 ENCOUNTER — Encounter: Payer: Self-pay | Admitting: Physician Assistant

## 2020-11-30 ENCOUNTER — Other Ambulatory Visit: Payer: Self-pay

## 2020-11-30 VITALS — BP 124/76 | HR 67 | Ht 65.0 in | Wt 135.0 lb

## 2020-11-30 DIAGNOSIS — R350 Frequency of micturition: Secondary | ICD-10-CM | POA: Diagnosis not present

## 2020-11-30 DIAGNOSIS — C61 Malignant neoplasm of prostate: Secondary | ICD-10-CM

## 2020-11-30 LAB — BLADDER SCAN AMB NON-IMAGING

## 2020-11-30 NOTE — Progress Notes (Signed)
11/30/2020 5:17 PM   Jerry [name] 19-May-1945 PO:[id number]  CC: Chief Complaint  Patient presents with   Prostate Cancer   Follow-up   HPI: Jerry [name] is a 75 y.o. 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   Today he reports no acute concerns.  He is no longer taking Solifenacin for his frequency, urgency, and urge incontinence.  Overall, he states the symptoms have improved to resolved.  He reports nocturia x2.  He states he does have some urgency when he hears running water but otherwise feels well.  PSA from 4 days ago has resulted 0.2, previously undetectable.  He denies gross hematuria and dysuria.  PMH: Past Medical History:  Diagnosis Date   Abdominal wall mass of right lower quadrant    Abnormal finding on EKG    Anemia    patient unaware of this diagnosis   Atherosclerosis of lower extremity with claudication ([place])    Cigarette smoker    Hearing loss    Hyperlipidemia    Leg DVT (deep venous thromboembolism), acute, left (HCC)    Peripheral vascular disease (HCC)    blockages in both extremities   Vitamin D deficiency    patient unaware of this diagnosis    Surgical History: Past Surgical History:  Procedure Laterality Date   ANGIOPLASTY ILLIAC ARTERY Left 10/03/2017   Procedure: ANGIOPLASTY ILIAC ARTERY;  Surgeon: Algernon Huxley, MD;  Location: [facility];  Service: Vascular;  Laterality: Left;   BACK SURGERY  1990   Three sx in lower back. metal in lower back   COLONOSCOPY  01/2010   ENDARTERECTOMY FEMORAL Left 10/03/2017   Procedure: ENDARTERECTOMY FEMORAL;  Surgeon: Algernon Huxley, MD;  Location: [facility];  Service: Vascular;  Laterality: Left;   Left Leg stent Left 2009   fem fem bypass   LOWER EXTREMITY ANGIOGRAPHY Left 09/19/2017   Procedure: LOWER EXTREMITY ANGIOGRAPHY;  Surgeon: Algernon Huxley, MD;  Location: [place] [facility];  Service: Cardiovascular;   Laterality: Left;   LOWER EXTREMITY ANGIOGRAPHY Right 01/07/2018   Procedure: LOWER EXTREMITY ANGIOGRAPHY;  Surgeon: Algernon Huxley, MD;  Location: [place] [facility];  Service: Cardiovascular;  Laterality: Right;   PTCA Right 08/2017   VASCULAR SURGERY Left 2009   fem fem bypass     Home Medications:  Allergies as of 11/30/2020   No Known Allergies      Medication List        Accurate as of November 30, 2020  5:17 PM. If you have any questions, ask your nurse or doctor.          acetaminophen 325 MG tablet Commonly known as: TYLENOL Take 650 mg by mouth every 6 (six) hours as needed for moderate pain.   Aspirin Low Dose 81 MG EC tablet Generic drug: aspirin TAKE 1 TABLET EVERY DAY   atorvastatin 10 MG tablet Commonly known as: LIPITOR TAKE 1 TABLET EVERY DAY   CALCIUM 600 PO Take by mouth daily.   cholecalciferol 1000 units tablet Commonly known as: VITAMIN D Take 1,000 Units by mouth daily.   clopidogrel 75 MG tablet Commonly known as: PLAVIX TAKE 1 TABLET EVERY DAY   docusate sodium 100 MG capsule Commonly known as: COLACE Take 100 mg by mouth 2 (two) times daily.   ibuprofen 600 MG tablet Commonly known as: ADVIL Take 1 tablet (600 mg total) by  mouth every 8 (eight) hours as needed.   multivitamin capsule Take 1 capsule by mouth as needed.   solifenacin 5 MG tablet Commonly known as: VESICARE Take 1 tablet (5 mg total) by mouth daily.   tamsulosin 0.4 MG Caps capsule Commonly known as: FLOMAX Take 0.4 mg by mouth.        Allergies:  No Known Allergies  Family History: Family History  Problem Relation Age of Onset   Colon cancer Neg Hx     Social History:   reports that he quit smoking about 2 years ago. His smoking use included cigarettes. He has a 40.00 pack-year smoking history. He has never used smokeless tobacco. He reports that he does not drink alcohol and does not use drugs.  Physical Exam: BP 124/76   Pulse 67   Ht '5\' 5"'$   (1.651 m)   Wt 135 lb (61.2 kg)   BMI 22.47 kg/m   Constitutional:  Alert and oriented, no acute distress, nontoxic appearing HEENT: [place], AT Cardiovascular: No clubbing, cyanosis, or edema Respiratory: Normal respiratory effort, no increased work of breathing Skin: No rashes, bruises or suspicious lesions Neurologic: Grossly intact, no focal deficits, moving all 4 extremities Psychiatric: Normal mood and affect  Laboratory Data: Results for orders placed or performed in visit on 11/30/20  Bladder Scan (Post Void Residual) in office  Result Value Ref Range   Scan Result 23m    Assessment & Plan:   1. Prostate cancer ([facility] PSA no longer undetectable, will continue with every 6 month PSA checks.  No indication for intervention at this time.  We discussed the need for close follow-up and he expressed understanding. - PSA; Future  2. Urinary frequency Resolved, no further pharmacologic intervention indicated. - Bladder Scan (Post Void Residual) in office  Return in about 6 months (around 05/30/2021) for prostate cancer f/u with PSA prior.  SDebroah Loop PA-C  [facility] [address] [place] [phone]

## 2021-05-25 ENCOUNTER — Other Ambulatory Visit: Payer: Self-pay

## 2021-05-25 DIAGNOSIS — C61 Malignant neoplasm of prostate: Secondary | ICD-10-CM

## 2021-05-30 ENCOUNTER — Other Ambulatory Visit: Payer: Medicare PPO

## 2021-06-01 ENCOUNTER — Ambulatory Visit: Payer: Medicare PPO | Admitting: Urology

## 2021-08-02 ENCOUNTER — Telehealth: Payer: Self-pay | Admitting: Urology

## 2021-08-02 NOTE — Telephone Encounter (Signed)
Jerry [name] missed his follow up with for prostate cancer in March and it is important that he reschedule as his PSA was starting to increase and this could mean a recurrence of his prostate cancer.  ?

## 2021-08-02 NOTE — Telephone Encounter (Signed)
Attempted to reach pt, spoke with pts wife who states pt was not home and I wouldn't be able to reach him till tomorrow morning. Will attempt again tomorrow.  ?

## 2021-08-09 ENCOUNTER — Encounter: Payer: Self-pay | Admitting: Ophthalmology

## 2021-08-15 NOTE — Telephone Encounter (Signed)
Patient mailed missed appt letter on 08/15/21.

## 2021-08-15 NOTE — Discharge Instructions (Signed)

## 2021-08-17 ENCOUNTER — Ambulatory Visit
Admission: RE | Admit: 2021-08-17 | Discharge: 2021-08-17 | Disposition: A | Discharge status: Home / Self Care | Payer: Medicare PPO | Attending: Ophthalmology | Admitting: Ophthalmology

## 2021-08-17 ENCOUNTER — Other Ambulatory Visit: Payer: Self-pay

## 2021-08-17 ENCOUNTER — Encounter
Admission: RE | Disposition: A | Discharge status: Home / Self Care | Payer: Self-pay | Source: Home / Self Care | Attending: Ophthalmology

## 2021-08-17 ENCOUNTER — Ambulatory Visit: Payer: Medicare PPO | Admitting: Anesthesiology

## 2021-08-17 ENCOUNTER — Encounter: Payer: Self-pay | Admitting: Ophthalmology

## 2021-08-17 DIAGNOSIS — Z7902 Long term (current) use of antithrombotics/antiplatelets: Secondary | ICD-10-CM | POA: Diagnosis not present

## 2021-08-17 DIAGNOSIS — H2512 Age-related nuclear cataract, left eye: Secondary | ICD-10-CM | POA: Insufficient documentation

## 2021-08-17 DIAGNOSIS — I739 Peripheral vascular disease, unspecified: Secondary | ICD-10-CM | POA: Diagnosis not present

## 2021-08-17 DIAGNOSIS — F17210 Nicotine dependence, cigarettes, uncomplicated: Secondary | ICD-10-CM | POA: Insufficient documentation

## 2021-08-17 HISTORY — DX: Presence of dental prosthetic device (complete) (partial): Z97.2

## 2021-08-17 HISTORY — PX: CATARACT EXTRACTION W/PHACO: SHX586

## 2021-08-17 SURGERY — PHACOEMULSIFICATION, CATARACT, WITH IOL INSERTION
Anesthesia: Monitor Anesthesia Care | Site: Eye | Laterality: Left

## 2021-08-17 MED ORDER — NA CHONDROIT SULF-NA HYALURON 40-30 MG/ML IO SOSY
INTRAOCULAR | Status: DC | PRN
  Administered 2021-08-17: 0.5 mL via INTRAOCULAR

## 2021-08-17 MED ORDER — SIGHTPATH DOSE#1 BSS IO SOLN
INTRAOCULAR | Status: DC | PRN
  Administered 2021-08-17: 174 mL via OPHTHALMIC

## 2021-08-17 MED ORDER — MIDAZOLAM HCL 2 MG/2ML IJ SOLN
INTRAMUSCULAR | Status: DC | PRN
  Administered 2021-08-17: 2 mg via INTRAVENOUS

## 2021-08-17 MED ORDER — FENTANYL CITRATE (PF) 100 MCG/2ML IJ SOLN
INTRAMUSCULAR | Status: DC | PRN
  Administered 2021-08-17 (×2): 50 ug via INTRAVENOUS

## 2021-08-17 MED ORDER — BALANCED SALT IO SOLN
INTRAOCULAR | Status: DC | PRN
  Administered 2021-08-17: 1 mL via INTRAOCULAR

## 2021-08-17 MED ORDER — MOXIFLOXACIN HCL 0.5 % OP SOLN
OPHTHALMIC | Status: DC | PRN
Start: 2021-08-17 — End: 2021-08-17
  Administered 2021-08-17: 0.2 mL via OPHTHALMIC

## 2021-08-17 MED ORDER — TETRACAINE HCL 0.5 % OP SOLN
1.0000 [drp] | OPHTHALMIC | Status: DC | PRN
  Administered 2021-08-17 (×3): 1 [drp] via OPHTHALMIC

## 2021-08-17 MED ORDER — ARMC OPHTHALMIC DILATING DROPS
1.0000 "application " | OPHTHALMIC | Status: DC | PRN
  Administered 2021-08-17 (×3): 1 via OPHTHALMIC

## 2021-08-17 MED ORDER — SIGHTPATH DOSE#1 SODIUM HYALURONATE 23 MG/ML IO SOLUTION
PREFILLED_SYRINGE | INTRAOCULAR | Status: DC | PRN
Start: 2021-08-17 — End: 2021-08-17
  Administered 2021-08-17: 0.6 mL via INTRAOCULAR

## 2021-08-17 MED ORDER — ONDANSETRON HCL 4 MG/2ML IJ SOLN
INTRAMUSCULAR | Status: DC | PRN
  Administered 2021-08-17: 4 mg via INTRAVENOUS

## 2021-08-17 MED ORDER — TRYPAN BLUE 0.06 % IO SOSY
PREFILLED_SYRINGE | INTRAOCULAR | Status: DC | PRN
Start: 2021-08-17 — End: 2021-08-17
  Administered 2021-08-17: .02 mL via INTRAOCULAR

## 2021-08-17 MED ORDER — SIGHTPATH DOSE#1 SODIUM HYALURONATE 10 MG/ML IO SOLUTION
PREFILLED_SYRINGE | INTRAOCULAR | Status: DC | PRN
  Administered 2021-08-17: 0.85 mL via INTRAOCULAR

## 2021-08-17 MED ORDER — SIGHTPATH DOSE#1 BSS IO SOLN
INTRAOCULAR | Status: DC | PRN
  Administered 2021-08-17: 15 mL

## 2021-08-17 MED ORDER — BRIMONIDINE TARTRATE-TIMOLOL 0.2-0.5 % OP SOLN
OPHTHALMIC | Status: DC | PRN
  Administered 2021-08-17: 1 [drp] via OPHTHALMIC

## 2021-08-17 SURGICAL SUPPLY — 20 items
CANNULA ANT/CHMB 27G (MISCELLANEOUS) IMPLANT
CANNULA ANT/CHMB 27GA (MISCELLANEOUS) ×2 IMPLANT
CAPSULAR TENSION RING (MISCELLANEOUS) ×2
CATARACT SUITE SIGHTPATH (MISCELLANEOUS) ×2 IMPLANT
DISSECTOR HYDRO NUCLEUS 50X22 (MISCELLANEOUS) ×2 IMPLANT
FEE CATARACT SUITE SIGHTPATH (MISCELLANEOUS) ×1 IMPLANT
GLOVE SURG GAMMEX PI TX LF 7.5 (GLOVE) ×2 IMPLANT
GLOVE SURG SYN 8.5  E (GLOVE) ×2
GLOVE SURG SYN 8.5 E (GLOVE) ×1 IMPLANT
GLOVE SURG SYN 8.5 PF PI (GLOVE) ×1 IMPLANT
LENS IOL TECNIS EYHANCE 20.0 (Intraocular Lens) ×1 IMPLANT
NDL FILTER BLUNT 18X1 1/2 (NEEDLE) ×1 IMPLANT
NEEDLE FILTER BLUNT 18X 1/2SAF (NEEDLE) ×1
NEEDLE FILTER BLUNT 18X1 1/2 (NEEDLE) ×1 IMPLANT
RING TENSION CAPSULAR (MISCELLANEOUS) IMPLANT
SUT ETHILON 10-0 CS-B-6CS-B-6 (SUTURE) ×2
SUTURE EHLN 10-0 CS-B-6CS-B-6 (SUTURE) IMPLANT
SYR 3ML LL SCALE MARK (SYRINGE) ×2 IMPLANT
SYR 5ML LL (SYRINGE) ×2 IMPLANT
WATER STERILE IRR 250ML POUR (IV SOLUTION) ×2 IMPLANT

## 2021-08-17 NOTE — Anesthesia Preprocedure Evaluation (Addendum)
Anesthesia Evaluation  Patient identified by MRN, date of birth, ID band Patient awake    History of Anesthesia Complications Negative for: history of anesthetic complications  Airway Mallampati: III  TM Distance: >3 FB Neck ROM: Full    Dental  (+) Upper Dentures, Partial Lower   Pulmonary Current Smoker (pt states 1 pack cigarettes per week. spouse/partner says 1 ppd),    Pulmonary exam normal        Cardiovascular Exercise Tolerance: Good + Peripheral Vascular Disease (s/p femoral endarterectomy and prior stents, on Plavix)  Normal cardiovascular exam     Neuro/Psych negative neurological ROS  negative psych ROS   GI/Hepatic negative GI ROS, Neg liver ROS,   Endo/Other  negative endocrine ROS  Renal/GU negative Renal ROS     Musculoskeletal   Abdominal   Peds  Hematology negative hematology ROS (+)   Anesthesia Other Findings   Reproductive/Obstetrics                           Anesthesia Physical Anesthesia Plan  ASA: 3  Anesthesia Plan: MAC   Post-op Pain Management: Minimal or no pain anticipated   Induction: Intravenous  PONV Risk Score and Plan: 0 and Treatment may vary due to age or medical condition, Midazolam and TIVA  Airway Management Planned: Nasal Cannula and Natural Airway  Additional Equipment: None  Intra-op Plan:   Post-operative Plan:   Informed Consent: I have reviewed the patients History and Physical, chart, labs and discussed the procedure including the risks, benefits and alternatives for the proposed anesthesia with the patient or authorized representative who has indicated his/her understanding and acceptance.       Plan Discussed with: CRNA  Anesthesia Plan Comments:         Anesthesia Quick Evaluation

## 2021-08-17 NOTE — Anesthesia Procedure Notes (Signed)
Procedure Name: MAC Date/Time: 08/17/2021 8:10 AM Performed by: Jeannene Patella, CRNA Pre-anesthesia Checklist: Patient identified, Emergency Drugs available, Suction available, Timeout performed and Patient being monitored Patient Re-evaluated:Patient Re-evaluated prior to induction Oxygen Delivery Method: Nasal cannula Placement Confirmation: positive ETCO2

## 2021-08-17 NOTE — Transfer of Care (Signed)
Immediate Anesthesia Transfer of Care Note  Patient: Jerry [name]  Procedure(s) Performed: CATARACT EXTRACTION PHACO AND INTRAOCULAR LENS PLACEMENT (IOC) LEFT 3.48 00:30.9 (Left: Eye)  Patient Location: PACU  Anesthesia Type: MAC  Level of Consciousness: awake, alert  and patient cooperative  Airway and Oxygen Therapy: Patient Spontanous Breathing and Patient connected to supplemental oxygen  Post-op Assessment: Post-op Vital signs reviewed, Patient's Cardiovascular Status Stable, Respiratory Function Stable, Patent Airway and No signs of Nausea or vomiting  Post-op Vital Signs: Reviewed and stable  Complications: No notable events documented.

## 2021-08-17 NOTE — H&P (Signed)
[facility]   Primary Care Physician:  Marguerita Merles, MD Ophthalmologist: Dr. Benay Pillow  Pre-Procedure History & Physical: HPI:  Jerry [name] is a 76 y.o. male here for cataract surgery.   Past Medical History:  Diagnosis Date   Abdominal wall mass of right lower quadrant    Abnormal finding on EKG    Anemia    patient unaware of this diagnosis   Atherosclerosis of lower extremity with claudication ([place])    Cigarette smoker    Hearing loss    Hyperlipidemia    Leg DVT (deep venous thromboembolism), acute, left (HCC)    Peripheral vascular disease (HCC)    blockages in both extremities   Vitamin D deficiency    patient unaware of this diagnosis   Wears dentures    Full upper, Partial lower    Past Surgical History:  Procedure Laterality Date   ANGIOPLASTY ILLIAC ARTERY Left 10/03/2017   Procedure: ANGIOPLASTY ILIAC ARTERY;  Surgeon: Algernon Huxley, MD;  Location: [facility] ORS;  Service: Vascular;  Laterality: Left;   BACK SURGERY  1990   Three sx in lower back. metal in lower back   COLONOSCOPY  01/2010   ENDARTERECTOMY FEMORAL Left 10/03/2017   Procedure: ENDARTERECTOMY FEMORAL;  Surgeon: Algernon Huxley, MD;  Location: [facility] ORS;  Service: Vascular;  Laterality: Left;   Left Leg stent Left 2009   fem fem bypass   LOWER EXTREMITY ANGIOGRAPHY Left 09/19/2017   Procedure: LOWER EXTREMITY ANGIOGRAPHY;  Surgeon: Algernon Huxley, MD;  Location: [place] [facility];  Service: Cardiovascular;  Laterality: Left;   LOWER EXTREMITY ANGIOGRAPHY Right 01/07/2018   Procedure: LOWER EXTREMITY ANGIOGRAPHY;  Surgeon: Algernon Huxley, MD;  Location: [place] [facility];  Service: Cardiovascular;  Laterality: Right;   PTCA Right 08/2017   VASCULAR SURGERY Left 2009   fem fem bypass     Prior to Admission medications   Medication Sig Start Date End Date Taking? Authorizing Provider  acetaminophen (TYLENOL) 325 MG tablet Take 650 mg by mouth every 6 (six) hours as needed for moderate pain.    Yes [provider]  ASPIRIN LOW DOSE 81 MG EC tablet TAKE 1 TABLET EVERY DAY 11/09/20  Yes Kris Hartmann, NP  atorvastatin (LIPITOR) 10 MG tablet TAKE 1 TABLET EVERY DAY 11/09/20  Yes Kris Hartmann, NP  Calcium Carbonate (CALCIUM 600 PO) Take by mouth daily.   Yes [provider]  cholecalciferol (VITAMIN D) 1000 units tablet Take 1,000 Units by mouth daily.   Yes [provider]  clopidogrel (PLAVIX) 75 MG tablet TAKE 1 TABLET EVERY DAY 11/09/20  Yes Kris Hartmann, NP  docusate sodium (COLACE) 100 MG capsule Take 100 mg by mouth 2 (two) times daily.   Yes [provider]  ibuprofen (ADVIL,MOTRIN) 600 MG tablet Take 1 tablet (600 mg total) by mouth every 8 (eight) hours as needed. 12/06/17  Yes Sable Feil, PA-C  Multiple Vitamin (MULTIVITAMIN) capsule Take 1 capsule by mouth as needed.   Yes [provider]  solifenacin (VESICARE) 5 MG tablet Take 1 tablet (5 mg total) by mouth daily. 05/27/20  Yes Stoioff, Ronda Fairly, MD  tamsulosin (FLOMAX) 0.4 MG CAPS capsule Take 0.4 mg by mouth.   Yes [provider]    Allergies as of 07/29/2021   (No Known Allergies)    Family History  Problem Relation Age of Onset   Colon cancer Neg Hx     Social  History   Socioeconomic History   Marital status: Married    Spouse name: Not on file   Number of children: 6   Years of education: Not on file   Highest education level: Not on file  Occupational History   Not on file  Tobacco Use   Smoking status: Every Day    Packs/day: 0.50    Years: 40.00    Pack years: 20.00    Types: Cigarettes   Smokeless tobacco: Never   Tobacco comments:       Vaping Use   Vaping Use: Never used  Substance and Sexual Activity   Alcohol use: Never   Drug use: Never   Sexual activity: Not Currently  Other Topics Concern   Not on file  Social History Narrative   Not on file   Social Determinants of Health   Financial Resource Strain: Not on file   Food Insecurity: Not on file  Transportation Needs: Not on file  Physical Activity: Not on file  Stress: Not on file  Social Connections: Not on file  Intimate Partner Violence: Not on file    Review of Systems: See HPI, otherwise negative ROS  Physical Exam: BP 140/67   Pulse (!) 50   Temp 97.7 F (36.5 C)   Ht '5\' 5"'$  (1.651 m)   Wt 59.4 kg   SpO2 98%   BMI 21.80 kg/m  General:   Alert, cooperative in NAD Head:  Normocephalic and atraumatic. Respiratory:  Normal work of breathing. Cardiovascular:  RRR  Impression/Plan: Jerry [name] is here for cataract surgery.  Risks, benefits, limitations, and alternatives regarding cataract surgery have been reviewed with the patient.  Questions have been answered.  All parties agreeable.   Benay Pillow, MD  08/17/2021, 8:01 AM

## 2021-08-17 NOTE — Op Note (Signed)
OPERATIVE NOTE  Jerry [name] [id number] 08/17/2021   PREOPERATIVE DIAGNOSIS:  Nuclear sclerotic cataract left eye.  H25.12   POSTOPERATIVE DIAGNOSIS:      Nuclear sclerotic cataract left eye.   Zonular weakness, left eye.   PROCEDURE:   CPT [phone], Complex phacoemusification with posterior chamber intraocular lens placement of the left eye, requiring trypan blue for visualization of the capsule and placement of a capsular tension ring.  LENS:   Implant Name Type Inv. Item Serial No. Manufacturer Lot No. LRB No. Used Action  LENS IOL TECNIS EYHANCE 20.0 - [id number] Intraocular Lens LENS IOL TECNIS EYHANCE 20.0 [id number] [employer]  Left 1 Implanted      Procedure(s): CATARACT EXTRACTION PHACO AND INTRAOCULAR LENS PLACEMENT (IOC) LEFT 3.48 00:30.9 (Left)  DIB00 +20.0   ULTRASOUND TIME: 0 minutes 30 seconds.  CDE 3.48   SURGEON:  Benay Pillow, MD, MPH   ANESTHESIA:  Topical with tetracaine drops augmented with 1% preservative-free intracameral lidocaine.  ESTIMATED BLOOD LOSS: <1 mL   COMPLICATIONS:  None.   DESCRIPTION OF PROCEDURE:  The patient was identified in the holding room and transported to the operating room and placed in the supine position under the operating microscope.  The left eye was identified as the operative eye and it was prepped and draped in the usual sterile ophthalmic fashion.   The patient exhibited some head movement during prep, so the head was taped into position.   A 1.0 millimeter clear-corneal paracentesis was made at the 5:00 position. 0.5 ml of preservative-free 1% lidocaine with epinephrine was injected into the anterior chamber.  The anterior chamber was filled with Healon 5 viscoelastic.  A 2.4 millimeter keratome was used to make a near-clear corneal incision at the 2:00 position.  A curvilinear capsulorrhexis was made with a cystotome and capsulorrhexis forceps.  Balanced salt solution was used to hydrodissect and hydrodelineate the  nucleus.   Phacoemulsification was then used in stop and chop fashion to remove the lens nucleus and epinucleus.    There was significant zonular laxity evident despite gentle maneuvers.  A capsular tension ring was placed without difficulty.  Trypan blue was instilled to better visualize the capsule for the remainder of the case.  The remaining cortex was then removed using the irrigation and aspiration handpiece. Healon was then placed into the capsular bag to distend it for lens placement.  A lens was then injected into the capsular bag.  The remaining viscoelastic was aspirated.  There was a small amount of residual subincisional cortex, less than one clock hour and not visually significant.   Wounds were hydrated with balanced salt solution.  The anterior chamber was inflated to a physiologic pressure with balanced salt solution.   Intracameral vigamox 0.1 mL undiltued was injected into the eye and a drop placed onto the ocular surface.  The main  wound was leaking.  A 10-0 nylon suture was placed.  The patient was taken to the recovery room in stable condition without complications of anesthesia or surgery  Benay Pillow 08/17/2021, 8:53 AM

## 2021-08-17 NOTE — Anesthesia Postprocedure Evaluation (Signed)
Anesthesia Post Note  Patient: Jerry [name]  Procedure(s) Performed: CATARACT EXTRACTION PHACO AND INTRAOCULAR LENS PLACEMENT (IOC) LEFT 3.48 00:30.9 (Left: Eye)     Patient location during evaluation: PACU Anesthesia Type: MAC Level of consciousness: awake and alert Pain management: pain level controlled Vital Signs Assessment: post-procedure vital signs reviewed and stable Respiratory status: spontaneous breathing Cardiovascular status: blood pressure returned to baseline Postop Assessment: no apparent nausea or vomiting, adequate PO intake and no headache Anesthetic complications: no   There were no known notable events for this encounter.  Adele Barthel Trevonne Nyland

## 2021-08-18 ENCOUNTER — Encounter: Payer: Self-pay | Admitting: Ophthalmology

## 2021-08-29 ENCOUNTER — Other Ambulatory Visit: Payer: Medicare PPO

## 2021-08-29 DIAGNOSIS — C61 Malignant neoplasm of prostate: Secondary | ICD-10-CM

## 2021-08-30 LAB — PSA: Prostate Specific Ag, Serum: 0.6 ng/mL (ref 0.0–4.0)

## 2021-08-31 ENCOUNTER — Encounter: Payer: Self-pay | Admitting: Urology

## 2021-08-31 ENCOUNTER — Ambulatory Visit (INDEPENDENT_AMBULATORY_CARE_PROVIDER_SITE_OTHER): Payer: Medicare PPO | Admitting: Urology

## 2021-08-31 VITALS — BP 123/78 | HR 49 | Ht 65.0 in | Wt 132.0 lb

## 2021-08-31 DIAGNOSIS — Z8546 Personal history of malignant neoplasm of prostate: Secondary | ICD-10-CM | POA: Diagnosis not present

## 2021-08-31 DIAGNOSIS — R399 Unspecified symptoms and signs involving the genitourinary system: Secondary | ICD-10-CM | POA: Diagnosis not present

## 2021-08-31 DIAGNOSIS — C61 Malignant neoplasm of prostate: Secondary | ICD-10-CM

## 2021-08-31 NOTE — Progress Notes (Signed)
08/31/2021 9:12 AM   Jerry [name] 03/22/1946 [id number]  Referring provider: Marguerita Merles, [place] [address] [place],  [place] [address]  Chief Complaint  Patient presents with   Prostate Cancer    Urologic history: 1.  Prostate cancer -T2 intermediate (unfavorable) risk prostate cancer -Left prostate nodule and PI-RADS 5 lesion on MRI -Fusion biopsy 04/2018; ROI positive Gleason 4+3 -Additional positive cores on standard template 3+/4+3 -IMRT + ADT; IMRT completed 08/2018; first leuprolide 06/2018 (18 months ADT with final injection June 2021)  HPI: 76 y.o. male presents for follow-up.  Remains on tamsulosin and Solifenacin with stable lower urinary tract symptoms Denies dysuria, gross hematuria No flank, abdominal or pelvic pain Notes occasional blood per rectum.  Does not remember date of last colonoscopy PSA 08/29/2021 was 0.6 (0.2 11/2020; < 0.1 05/2020)   PMH: Past Medical History:  Diagnosis Date   Abdominal wall mass of right lower quadrant    Abnormal finding on EKG    Anemia    patient unaware of this diagnosis   Atherosclerosis of lower extremity with claudication ([place])    Cigarette smoker    Hearing loss    Hyperlipidemia    Leg DVT (deep venous thromboembolism), acute, left (HCC)    Peripheral vascular disease (HCC)    blockages in both extremities   Vitamin D deficiency    patient unaware of this diagnosis   Wears dentures    Full upper, Partial lower    Surgical History: Past Surgical History:  Procedure Laterality Date   ANGIOPLASTY ILLIAC ARTERY Left 10/03/2017   Procedure: ANGIOPLASTY ILIAC ARTERY;  Surgeon: Algernon Huxley, MD;  Location: [facility];  Service: Vascular;  Laterality: Left;   BACK SURGERY  1990   Three sx in lower back. metal in lower back   CATARACT EXTRACTION W/PHACO Left 08/17/2021   Procedure: CATARACT EXTRACTION PHACO AND INTRAOCULAR LENS PLACEMENT (IOC) LEFT 3.48 00:30.9;  Surgeon: Eulogio Bear, MD;  Location: [facility];  Service: Ophthalmology;  Laterality: Left;   COLONOSCOPY  01/2010   ENDARTERECTOMY FEMORAL Left 10/03/2017   Procedure: ENDARTERECTOMY FEMORAL;  Surgeon: Algernon Huxley, MD;  Location: [facility];  Service: Vascular;  Laterality: Left;   Left Leg stent Left 2009   fem fem bypass   LOWER EXTREMITY ANGIOGRAPHY Left 09/19/2017   Procedure: LOWER EXTREMITY ANGIOGRAPHY;  Surgeon: Algernon Huxley, MD;  Location: [facility] [facility];  Service: Cardiovascular;  Laterality: Left;   LOWER EXTREMITY ANGIOGRAPHY Right 01/07/2018   Procedure: LOWER EXTREMITY ANGIOGRAPHY;  Surgeon: Algernon Huxley, MD;  Location: [facility];  Service: Cardiovascular;  Laterality: Right;   PTCA Right 08/2017   VASCULAR SURGERY Left 2009   fem fem bypass     Home Medications:  Allergies as of 08/31/2021   No Known Allergies      Medication List        Accurate as of August 31, 2021  9:12 AM. If you have any questions, ask your nurse or doctor.          acetaminophen 325 MG tablet Commonly known as: TYLENOL Take 650 mg by mouth every 6 (six) hours as needed for moderate pain.   Aspirin Low Dose 81 MG tablet Generic drug: aspirin EC TAKE 1 TABLET EVERY DAY   atorvastatin 10 MG tablet Commonly known as: LIPITOR TAKE 1 TABLET EVERY DAY   CALCIUM 600 PO Take by mouth daily.   cholecalciferol 1000 units tablet Commonly  known as: VITAMIN D Take 1,000 Units by mouth daily.   clopidogrel 75 MG tablet Commonly known as: PLAVIX TAKE 1 TABLET EVERY DAY   docusate sodium 100 MG capsule Commonly known as: COLACE Take 100 mg by mouth 2 (two) times daily.   ibuprofen 600 MG tablet Commonly known as: ADVIL Take 1 tablet (600 mg total) by mouth every 8 (eight) hours as needed.   multivitamin capsule Take 1 capsule by mouth as needed.   solifenacin 5 MG tablet Commonly known as: VESICARE Take 1 tablet (5 mg total) by mouth daily.   tamsulosin 0.4 MG Caps capsule Commonly known as:  FLOMAX Take 0.4 mg by mouth.        Allergies: No Known Allergies  Family History: Family History  Problem Relation Age of Onset   Colon cancer Neg Hx     Social History:  reports that he has been smoking cigarettes. He has a 20.00 pack-year smoking history. He has never used smokeless tobacco. He reports that he does not drink alcohol and does not use drugs.   Physical Exam: BP 123/78   Pulse (!) 49   Ht '5\' 5"'$  (1.651 m)   Wt 132 lb (59.9 kg)   BMI 21.97 kg/m   Constitutional:  Alert and oriented, No acute distress. HEENT: [place] AT, moist mucus membranes.  Trachea midline, no masses. Cardiovascular: No clubbing, cyanosis, or edema. Respiratory: Normal respiratory effort, no increased work of breathing.   Assessment & Plan:    1.  T2 intermediate risk prostate cancer Rising PSA We discussed doubling time concerning for prostate cancer recurrence Recommend PSMA/PET  2.  Lower urinary tract symptoms Stable on tamsulosin/Solifenacin  3.  Hematochezia Follow-up with PCP    Abbie Sons, MD  [facility] [address], [place], [place] [address] [phone]

## 2021-09-05 ENCOUNTER — Encounter: Payer: Self-pay | Admitting: Urology

## 2021-09-07 NOTE — Discharge Instructions (Signed)

## 2021-09-12 ENCOUNTER — Ambulatory Visit: Payer: Medicare PPO | Admitting: Anesthesiology

## 2021-09-12 ENCOUNTER — Encounter
Admission: RE | Disposition: A | Discharge status: Home / Self Care | Payer: Self-pay | Source: Home / Self Care | Attending: Ophthalmology

## 2021-09-12 ENCOUNTER — Ambulatory Visit
Admission: RE | Admit: 2021-09-12 | Discharge: 2021-09-12 | Disposition: A | Discharge status: Home / Self Care | Payer: Medicare PPO | Attending: Ophthalmology | Admitting: Ophthalmology

## 2021-09-12 ENCOUNTER — Other Ambulatory Visit: Payer: Self-pay

## 2021-09-12 ENCOUNTER — Encounter: Payer: Self-pay | Admitting: Ophthalmology

## 2021-09-12 DIAGNOSIS — D649 Anemia, unspecified: Secondary | ICD-10-CM | POA: Diagnosis not present

## 2021-09-12 DIAGNOSIS — E785 Hyperlipidemia, unspecified: Secondary | ICD-10-CM | POA: Diagnosis not present

## 2021-09-12 DIAGNOSIS — I739 Peripheral vascular disease, unspecified: Secondary | ICD-10-CM | POA: Diagnosis not present

## 2021-09-12 DIAGNOSIS — R935 Abnormal findings on diagnostic imaging of other abdominal regions, including retroperitoneum: Secondary | ICD-10-CM

## 2021-09-12 DIAGNOSIS — H2511 Age-related nuclear cataract, right eye: Secondary | ICD-10-CM | POA: Diagnosis present

## 2021-09-12 DIAGNOSIS — D759 Disease of blood and blood-forming organs, unspecified: Secondary | ICD-10-CM | POA: Insufficient documentation

## 2021-09-12 DIAGNOSIS — Z7902 Long term (current) use of antithrombotics/antiplatelets: Secondary | ICD-10-CM | POA: Diagnosis not present

## 2021-09-12 DIAGNOSIS — F17210 Nicotine dependence, cigarettes, uncomplicated: Secondary | ICD-10-CM | POA: Insufficient documentation

## 2021-09-12 DIAGNOSIS — Z95828 Presence of other vascular implants and grafts: Secondary | ICD-10-CM | POA: Diagnosis not present

## 2021-09-12 DIAGNOSIS — Z86718 Personal history of other venous thrombosis and embolism: Secondary | ICD-10-CM | POA: Insufficient documentation

## 2021-09-12 HISTORY — PX: CATARACT EXTRACTION W/PHACO: SHX586

## 2021-09-12 SURGERY — PHACOEMULSIFICATION, CATARACT, WITH IOL INSERTION
Anesthesia: Monitor Anesthesia Care | Site: Eye | Laterality: Right

## 2021-09-12 MED ORDER — ONDANSETRON HCL 4 MG/2ML IJ SOLN: 4.0000 mg | Freq: Once | INTRAMUSCULAR | Status: DC | PRN

## 2021-09-12 MED ORDER — SIGHTPATH DOSE#1 SODIUM HYALURONATE 10 MG/ML IO SOLUTION
PREFILLED_SYRINGE | INTRAOCULAR | Status: DC | PRN
  Administered 2021-09-12: 0.85 mL via INTRAOCULAR

## 2021-09-12 MED ORDER — LACTATED RINGERS IV SOLN: INTRAVENOUS | Status: DC

## 2021-09-12 MED ORDER — FENTANYL CITRATE (PF) 100 MCG/2ML IJ SOLN
INTRAMUSCULAR | Status: DC | PRN
  Administered 2021-09-12: 50 ug via INTRAVENOUS

## 2021-09-12 MED ORDER — ARMC OPHTHALMIC DILATING DROPS
1.0000 | OPHTHALMIC | Status: DC | PRN
  Administered 2021-09-12 (×3): 1 via OPHTHALMIC

## 2021-09-12 MED ORDER — SIGHTPATH DOSE#1 SODIUM HYALURONATE 23 MG/ML IO SOLUTION
PREFILLED_SYRINGE | INTRAOCULAR | Status: DC | PRN
  Administered 2021-09-12: 0.6 mL via INTRAOCULAR

## 2021-09-12 MED ORDER — LIDOCAINE HCL (PF) 2 % IJ SOLN
INTRAOCULAR | Status: DC | PRN
  Administered 2021-09-12: 1 mL via INTRAOCULAR

## 2021-09-12 MED ORDER — MOXIFLOXACIN HCL 0.5 % OP SOLN
OPHTHALMIC | Status: DC | PRN
  Administered 2021-09-12: 0.2 mL via OPHTHALMIC

## 2021-09-12 MED ORDER — ACETAMINOPHEN 160 MG/5ML PO SOLN: 325.0000 mg | ORAL | Status: DC | PRN

## 2021-09-12 MED ORDER — TETRACAINE HCL 0.5 % OP SOLN
1.0000 [drp] | OPHTHALMIC | Status: DC | PRN
  Administered 2021-09-12 (×3): 1 [drp] via OPHTHALMIC

## 2021-09-12 MED ORDER — ACETAMINOPHEN 325 MG PO TABS: 650.0000 mg | ORAL_TABLET | ORAL | Status: DC | PRN

## 2021-09-12 MED ORDER — SIGHTPATH DOSE#1 BSS IO SOLN
INTRAOCULAR | Status: DC | PRN
  Administered 2021-09-12: 134 mL via OPHTHALMIC

## 2021-09-12 MED ORDER — SIGHTPATH DOSE#1 BSS IO SOLN
INTRAOCULAR | Status: DC | PRN
  Administered 2021-09-12: 15 mL

## 2021-09-12 MED ORDER — MIDAZOLAM HCL 2 MG/2ML IJ SOLN
INTRAMUSCULAR | Status: DC | PRN
  Administered 2021-09-12: 1 mg via INTRAVENOUS

## 2021-09-12 SURGICAL SUPPLY — 14 items
CATARACT SUITE SIGHTPATH (MISCELLANEOUS) ×2 IMPLANT
DISSECTOR HYDRO NUCLEUS 50X22 (MISCELLANEOUS) ×2 IMPLANT
FEE CATARACT SUITE SIGHTPATH (MISCELLANEOUS) ×1 IMPLANT
GLOVE SURG GAMMEX PI TX LF 7.5 (GLOVE) ×2 IMPLANT
GLOVE SURG SYN 8.5  E (GLOVE) ×2
GLOVE SURG SYN 8.5 E (GLOVE) ×1 IMPLANT
GLOVE SURG SYN 8.5 PF PI (GLOVE) ×1 IMPLANT
LENS IOL TECNIS EYHANCE 20.5 (Intraocular Lens) ×1 IMPLANT
NDL FILTER BLUNT 18X1 1/2 (NEEDLE) ×1 IMPLANT
NEEDLE FILTER BLUNT 18X 1/2SAF (NEEDLE) ×1
NEEDLE FILTER BLUNT 18X1 1/2 (NEEDLE) ×1 IMPLANT
SYR 3ML LL SCALE MARK (SYRINGE) ×2 IMPLANT
SYR 5ML LL (SYRINGE) ×2 IMPLANT
WATER STERILE IRR 250ML POUR (IV SOLUTION) ×2 IMPLANT

## 2021-09-12 NOTE — Transfer of Care (Signed)
Immediate Anesthesia Transfer of Care Note  Patient: Jerry [name]  Procedure(s) Performed: CATARACT EXTRACTION PHACO AND INTRAOCULAR LENS PLACEMENT (IOC) RIGHT 5.74 00:42.3 (Right: Eye)  Patient Location: PACU  Anesthesia Type: MAC  Level of Consciousness: awake, alert  and patient cooperative  Airway and Oxygen Therapy: Patient Spontanous Breathing and Patient connected to supplemental oxygen  Post-op Assessment: Post-op Vital signs reviewed, Patient's Cardiovascular Status Stable, Respiratory Function Stable, Patent Airway and No signs of Nausea or vomiting  Post-op Vital Signs: Reviewed and stable  Complications: No notable events documented.

## 2021-09-12 NOTE — Anesthesia Preprocedure Evaluation (Signed)
Anesthesia Evaluation  Patient identified by MRN, date of birth, ID band Patient awake    Reviewed: Allergy & Precautions, NPO status , Patient's Chart, lab work & pertinent test results, reviewed documented beta blocker date and time   History of Anesthesia Complications Negative for: history of anesthetic complications  Airway Mallampati: III  TM Distance: >3 FB Neck ROM: Limited    Dental  (+) Upper Dentures, Partial Lower   Pulmonary Current SmokerPatient did not abstain from smoking.,    breath sounds clear to auscultation       Cardiovascular (-) angina+ Peripheral Vascular Disease (s/p lower extremity stent, on Plavix)  (-) DOE  Rhythm:Regular Rate:Normal   HLD   Neuro/Psych    GI/Hepatic neg GERD  ,  Endo/Other    Renal/GU      Musculoskeletal   Abdominal   Peds  Hematology  (+) Blood dyscrasia, anemia ,  H/o DVT   Anesthesia Other Findings   Reproductive/Obstetrics                             Anesthesia Physical Anesthesia Plan  ASA: 3  Anesthesia Plan: MAC   Post-op Pain Management:    Induction: Intravenous  PONV Risk Score and Plan: 0 and TIVA, Midazolam and Treatment may vary due to age or medical condition  Airway Management Planned: Nasal Cannula  Additional Equipment:   Intra-op Plan:   Post-operative Plan:   Informed Consent: I have reviewed the patients History and Physical, chart, labs and discussed the procedure including the risks, benefits and alternatives for the proposed anesthesia with the patient or authorized representative who has indicated his/her understanding and acceptance.       Plan Discussed with: CRNA and Anesthesiologist  Anesthesia Plan Comments:         Anesthesia Quick Evaluation

## 2021-09-12 NOTE — Anesthesia Postprocedure Evaluation (Signed)
Anesthesia Post Note  Patient: Jerry [name]  Procedure(s) Performed: CATARACT EXTRACTION PHACO AND INTRAOCULAR LENS PLACEMENT (IOC) RIGHT 5.74 00:42.3 (Right: Eye)     Patient location during evaluation: PACU Anesthesia Type: MAC Level of consciousness: awake and alert Pain management: pain level controlled Vital Signs Assessment: post-procedure vital signs reviewed and stable Respiratory status: spontaneous breathing, nonlabored ventilation, respiratory function stable and patient connected to nasal cannula oxygen Cardiovascular status: stable and blood pressure returned to baseline Postop Assessment: no apparent nausea or vomiting Anesthetic complications: no   No notable events documented.  Rameen Quinney A  Jaycelyn Orrison

## 2021-09-12 NOTE — Op Note (Signed)
OPERATIVE NOTE  Jerry [name] [id number] 09/12/2021   PREOPERATIVE DIAGNOSIS:  Nuclear sclerotic cataract right eye.  H25.11   POSTOPERATIVE DIAGNOSIS:    Nuclear sclerotic cataract right eye.     PROCEDURE:  Phacoemusification with posterior chamber intraocular lens placement of the right eye   LENS:   Implant Name Type Inv. Item Serial No. Manufacturer Lot No. LRB No. Used Action  LENS IOL TECNIS EYHANCE 20.5 - [id number] Intraocular Lens LENS IOL TECNIS EYHANCE 20.5 [id number] [employer]  Right 1 Implanted       Procedure(s): CATARACT EXTRACTION PHACO AND INTRAOCULAR LENS PLACEMENT (IOC) RIGHT 5.74 00:42.3 (Right)  DIB00 +20.5   ULTRASOUND TIME: 0 minutes 42 seconds.  CDE 5.74   SURGEON:  Benay Pillow, MD, MPH  ANESTHESIOLOGIST: Anesthesiologist: Heniser, Fredric Dine, MD CRNA: Dionne Bucy, CRNA   ANESTHESIA:  Topical with tetracaine drops augmented with 1% preservative-free intracameral lidocaine.  ESTIMATED BLOOD LOSS: less than 1 mL.   COMPLICATIONS:  None.   DESCRIPTION OF PROCEDURE:  The patient was identified in the holding room and transported to the operating room and placed in the supine position under the operating microscope.  The right eye was identified as the operative eye and it was prepped and draped in the usual sterile ophthalmic fashion.   A 1.0 millimeter clear-corneal paracentesis was made at the 10:30 position. 0.5 ml of preservative-free 1% lidocaine with epinephrine was injected into the anterior chamber.  The anterior chamber was filled with Healon 5 viscoelastic.  A 2.4 millimeter keratome was used to make a near-clear corneal incision at the 8:00 position.  A curvilinear capsulorrhexis was made with a cystotome and capsulorrhexis forceps.  Balanced salt solution was used to hydrodissect and hydrodelineate the nucleus.   Phacoemulsification was then used in stop and chop fashion to remove the lens nucleus and epinucleus.  The remaining cortex was then  removed using the irrigation and aspiration handpiece. Healon was then placed into the capsular bag to distend it for lens placement.  A lens was then injected into the capsular bag.  The remaining viscoelastic was aspirated.   Wounds were hydrated with balanced salt solution.  The anterior chamber was inflated to a physiologic pressure with balanced salt solution.   Intracameral vigamox 0.1 mL undiluted was injected into the eye and a drop placed onto the ocular surface.  No wound leaks were noted.  The patient was taken to the recovery room in stable condition without complications of anesthesia or surgery  Benay Pillow 09/12/2021, 10:38 AM

## 2021-09-12 NOTE — H&P (Signed)
[facility]   Primary Care Physician:  Marguerita Merles, MD Ophthalmologist: Dr. Benay Pillow  Pre-Procedure History & Physical: HPI:  Jerry [name] is a 76 y.o. male here for cataract surgery.   Past Medical History:  Diagnosis Date   Abdominal wall mass of right lower quadrant    Abnormal finding on EKG    Anemia    patient unaware of this diagnosis   Atherosclerosis of lower extremity with claudication ([place])    Cigarette smoker    Hearing loss    Hyperlipidemia    Leg DVT (deep venous thromboembolism), acute, left (HCC)    Peripheral vascular disease (HCC)    blockages in both extremities   Vitamin D deficiency    patient unaware of this diagnosis   Wears dentures    Full upper, Partial lower    Past Surgical History:  Procedure Laterality Date   ANGIOPLASTY ILLIAC ARTERY Left 10/03/2017   Procedure: ANGIOPLASTY ILIAC ARTERY;  Surgeon: Algernon Huxley, MD;  Location: [facility];  Service: Vascular;  Laterality: Left;   BACK SURGERY  1990   Three sx in lower back. metal in lower back   CATARACT EXTRACTION W/PHACO Left 08/17/2021   Procedure: CATARACT EXTRACTION PHACO AND INTRAOCULAR LENS PLACEMENT (IOC) LEFT 3.48 00:30.9;  Surgeon: Eulogio Bear, MD;  Location: [place];  Service: Ophthalmology;  Laterality: Left;   COLONOSCOPY  01/2010   ENDARTERECTOMY FEMORAL Left 10/03/2017   Procedure: ENDARTERECTOMY FEMORAL;  Surgeon: Algernon Huxley, MD;  Location: [facility];  Service: Vascular;  Laterality: Left;   Left Leg stent Left 2009   fem fem bypass   LOWER EXTREMITY ANGIOGRAPHY Left 09/19/2017   Procedure: LOWER EXTREMITY ANGIOGRAPHY;  Surgeon: Algernon Huxley, MD;  Location: [place] [facility];  Service: Cardiovascular;  Laterality: Left;   LOWER EXTREMITY ANGIOGRAPHY Right 01/07/2018   Procedure: LOWER EXTREMITY ANGIOGRAPHY;  Surgeon: Algernon Huxley, MD;  Location: [place] [facility];  Service: Cardiovascular;  Laterality: Right;   PTCA Right 08/2017   VASCULAR  SURGERY Left 2009   fem fem bypass     Prior to Admission medications   Medication Sig Start Date End Date Taking? Authorizing Provider  acetaminophen (TYLENOL) 325 MG tablet Take 650 mg by mouth every 6 (six) hours as needed for moderate pain.   Yes [provider]  ASPIRIN LOW DOSE 81 MG EC tablet TAKE 1 TABLET EVERY DAY 11/09/20  Yes Kris Hartmann, NP  atorvastatin (LIPITOR) 10 MG tablet TAKE 1 TABLET EVERY DAY 11/09/20  Yes Kris Hartmann, NP  Calcium Carbonate (CALCIUM 600 PO) Take by mouth daily.   Yes [provider]  cholecalciferol (VITAMIN D) 1000 units tablet Take 1,000 Units by mouth daily.   Yes [provider]  clopidogrel (PLAVIX) 75 MG tablet TAKE 1 TABLET EVERY DAY 11/09/20  Yes Kris Hartmann, NP  docusate sodium (COLACE) 100 MG capsule Take 100 mg by mouth 2 (two) times daily.   Yes [provider]  ibuprofen (ADVIL,MOTRIN) 600 MG tablet Take 1 tablet (600 mg total) by mouth every 8 (eight) hours as needed. 12/06/17  Yes Sable Feil, PA-C  Multiple Vitamin (MULTIVITAMIN) capsule Take 1 capsule by mouth as needed.   Yes [provider]  solifenacin (VESICARE) 5 MG tablet Take 1 tablet (5 mg total) by mouth daily. 05/27/20  Yes Stoioff, Ronda Fairly, MD  tamsulosin (FLOMAX) 0.4 MG CAPS capsule Take 0.4 mg by mouth.  Yes [provider]    Allergies as of 08/18/2021   (No Known Allergies)    Family History  Problem Relation Age of Onset   Colon cancer Neg Hx     Social History   Socioeconomic History   Marital status: Married    Spouse name: Not on file   Number of children: 6   Years of education: Not on file   Highest education level: Not on file  Occupational History   Not on file  Tobacco Use   Smoking status: Every Day    Packs/day: 0.50    Years: 40.00    Total pack years: 20.00    Types: Cigarettes   Smokeless tobacco: Never   Tobacco comments:       Vaping Use   Vaping Use: Never used   Substance and Sexual Activity   Alcohol use: Never   Drug use: Never   Sexual activity: Not Currently  Other Topics Concern   Not on file  Social History Narrative   Not on file   Social Determinants of Health   Financial Resource Strain: Not on file  Food Insecurity: Not on file  Transportation Needs: Not on file  Physical Activity: Not on file  Stress: Not on file  Social Connections: Not on file  Intimate Partner Violence: Not on file    Review of Systems: See HPI, otherwise negative ROS  Physical Exam: BP 135/64   Pulse (!) 46   Temp 97.7 F (36.5 C) (Temporal)   Ht '5\' 5"'$  (1.651 m)   Wt 56.9 kg   SpO2 99%   BMI 20.88 kg/m  General:   Alert, cooperative in NAD Head:  Normocephalic and atraumatic. Respiratory:  Normal work of breathing. Cardiovascular:  RRR  Impression/Plan: Jerry [name] is here for cataract surgery.  Risks, benefits, limitations, and alternatives regarding cataract surgery have been reviewed with the patient.  Questions have been answered.  All parties agreeable.   Benay Pillow, MD  09/12/2021, 10:05 AM

## 2021-09-12 NOTE — Anesthesia Procedure Notes (Signed)
Procedure Name: MAC Date/Time: 09/12/2021 10:12 AM  Performed by: Dionne Bucy, CRNAPre-anesthesia Checklist: Patient identified, Emergency Drugs available, Suction available, Patient being monitored and Timeout performed Patient Re-evaluated:Patient Re-evaluated prior to induction Oxygen Delivery Method: Nasal cannula Placement Confirmation: positive ETCO2

## 2021-09-13 ENCOUNTER — Encounter: Payer: Self-pay | Admitting: Ophthalmology

## 2021-09-19 ENCOUNTER — Ambulatory Visit
Admission: RE | Admit: 2021-09-19 | Discharge: 2021-09-19 | Disposition: A | Discharge status: Home / Self Care | Payer: Medicare PPO | Source: Ambulatory Visit | Attending: Urology | Admitting: Urology

## 2021-09-19 DIAGNOSIS — J019 Acute sinusitis, unspecified: Secondary | ICD-10-CM | POA: Insufficient documentation

## 2021-09-19 DIAGNOSIS — C61 Malignant neoplasm of prostate: Secondary | ICD-10-CM | POA: Insufficient documentation

## 2021-09-19 DIAGNOSIS — I700 Atherosclerosis of aorta: Secondary | ICD-10-CM | POA: Diagnosis not present

## 2021-09-19 MED ORDER — PIFLIFOLASTAT F 18 (PYLARIFY) INJECTION
9.0000 | Freq: Once | INTRAVENOUS | Status: AC
  Administered 2021-09-19: 9.75 via INTRAVENOUS

## 2021-09-23 ENCOUNTER — Telehealth: Payer: Self-pay | Admitting: Family Medicine

## 2021-09-23 NOTE — Telephone Encounter (Signed)
-----   Message from Abbie Sons, MD sent at 09/23/2021  7:26 AM EDT ----- PSMA scan showed no evidence of prostate cancer recurrence.  Schedule 46-monthfollow-up office visit with PSA prior

## 2021-09-23 NOTE — Telephone Encounter (Signed)
LMOM for patient to return call.

## 2021-09-28 NOTE — Telephone Encounter (Signed)
Patient's wife notified and appointments have been scheduled.

## 2021-09-29 ENCOUNTER — Emergency Department
Admission: EM | Admit: 2021-09-29 | Discharge: 2021-09-29 | Disposition: A | Discharge status: Home / Self Care | Payer: Medicare PPO | Attending: Emergency Medicine | Admitting: Emergency Medicine

## 2021-09-29 ENCOUNTER — Other Ambulatory Visit: Payer: Self-pay

## 2021-09-29 ENCOUNTER — Emergency Department: Payer: Medicare PPO

## 2021-09-29 DIAGNOSIS — Y990 Civilian activity done for income or pay: Secondary | ICD-10-CM | POA: Insufficient documentation

## 2021-09-29 DIAGNOSIS — Z720 Tobacco use: Secondary | ICD-10-CM | POA: Insufficient documentation

## 2021-09-29 DIAGNOSIS — W228XXA Striking against or struck by other objects, initial encounter: Secondary | ICD-10-CM | POA: Insufficient documentation

## 2021-09-29 DIAGNOSIS — Z23 Encounter for immunization: Secondary | ICD-10-CM | POA: Insufficient documentation

## 2021-09-29 DIAGNOSIS — S6992XA Unspecified injury of left wrist, hand and finger(s), initial encounter: Secondary | ICD-10-CM | POA: Diagnosis present

## 2021-09-29 DIAGNOSIS — S61217A Laceration without foreign body of left little finger without damage to nail, initial encounter: Secondary | ICD-10-CM | POA: Diagnosis not present

## 2021-09-29 MED ORDER — TETANUS-DIPHTH-ACELL PERTUSSIS 5-2.5-18.5 LF-MCG/0.5 IM SUSY
0.5000 mL | PREFILLED_SYRINGE | Freq: Once | INTRAMUSCULAR | Status: AC
  Administered 2021-09-29: 0.5 mL via INTRAMUSCULAR
  Filled 2021-09-29: qty 0.5

## 2021-09-29 MED ORDER — LIDOCAINE HCL (PF) 1 % IJ SOLN
5.0000 mL | Freq: Once | INTRAMUSCULAR | Status: AC
  Administered 2021-09-29: 5 mL via INTRADERMAL
  Filled 2021-09-29: qty 5

## 2021-09-29 NOTE — ED Notes (Signed)
Spoke with Deneise Lever at [facility]  [phone])  states no UDS required

## 2021-09-29 NOTE — ED Notes (Signed)
See triage note  Presents with injury to left 5th finger  States he got it hit by a door at work. small laceration noted

## 2021-09-29 NOTE — ED Provider Notes (Signed)
[facility] Provider Note    Event Date/Time   First MD Initiated Contact with Patient 09/29/21 1023     (approximate)   History   Finger Injury   HPI  Jerry [name] is a 76 y.o. male who presents today for evaluation of the left fifth finger injury.  Patient reports that he was cleaning and accidentally got his finger stuck in the door.  Numbness or tingling.  He is unsure of his last tetanus shot.  Patient Active Problem List   Diagnosis Date Noted   Prostate nodule 02/12/2018   Abnormal MRI, pelvis 02/12/2018   Incontinence of urine 12/21/2017   Atherosclerosis with claudication of extremity ([place]) 10/03/2017   Abnormal finding on EKG 09/25/2017   Abdominal wall mass of right lower quadrant 09/14/2017   Loss of weight 09/14/2017   Hyperlipidemia 09/04/2017   Tobacco use disorder 09/04/2017   Atherosclerosis of lower extremity with claudication ([place]) 09/04/2017   Claudication ([place]) 06/14/2017          Physical Exam   Triage Vital Signs: ED Triage Vitals  Enc Vitals Group     BP 09/29/21 1022 134/74     Pulse Rate 09/29/21 1022 69     Resp 09/29/21 1022 19     Temp 09/29/21 1022 98.1 F (36.7 C)     Temp Source 09/29/21 1022 Oral     SpO2 09/29/21 1022 95 %     Weight 09/29/21 1027 118 lb 2.7 oz (53.6 kg)     Height 09/29/21 1027 '5\' 5"'$  (1.651 m)     Head Circumference --      Peak Flow --      Pain Score --      Pain Loc --      Pain Edu? --      Excl. in [place]? --     Most recent vital signs: Vitals:   09/29/21 1022  BP: 134/74  Pulse: 69  Resp: 19  Temp: 98.1 F (36.7 C)  SpO2: 95%    Physical Exam Vitals and nursing note reviewed.  Constitutional:      General: Awake and alert. No acute distress.    Appearance: Normal appearance. The patient is normal weight.  HENT:     Head: Normocephalic and atraumatic.     Mouth: Mucous membranes are moist.  Eyes:     General: PERRL. Normal EOMs        Right eye: No discharge.         Left eye: No discharge.     Conjunctiva/sclera: Conjunctivae normal.  Cardiovascular:     Rate and Rhythm: Normal rate and regular rhythm.     Pulses: Normal pulses.     Heart sounds: Normal heart sounds Pulmonary:     Effort: Pulmonary effort is normal. No respiratory distress.     Breath sounds: Normal breath sounds.  Abdominal:     Abdomen is soft. There is no abdominal tenderness. No rebound or guarding. No distention. Musculoskeletal:        General: No swelling. Normal range of motion.     Cervical back: Normal range of motion and neck supple.  Left hand: 5th finger with 2 cm laceration to the pad of the finger.  There is no fingernail or nailbed injury.  He is able to flex and extend at isolated PIP and DIP against resistance.  Sensation intact light touch throughout Skin:    General: Skin is warm and dry.  Capillary Refill: Capillary refill takes less than 2 seconds.     Findings: No rash.  Neurological:     Mental Status: The patient is awake and alert.      ED Results / Procedures / Treatments   Labs (all labs ordered are listed, but only abnormal results are displayed) Labs Reviewed - No data to display   EKG     RADIOLOGY I independently reviewed and interpreted imaging and agree with radiologists findings.     PROCEDURES:  Critical Care performed:   Marland KitchenMarland KitchenLaceration Repair  Date/Time: 09/29/2021 12:02 PM  Performed by: Marquette Old, PA-C Authorized by: Marquette Old, PA-C   Consent:    Consent obtained:  Verbal   Consent given by:  Patient   Risks, benefits, and alternatives were discussed: yes     Risks discussed:  Infection, need for additional repair, nerve damage, poor wound healing, poor cosmetic result, pain, retained foreign body, tendon damage and vascular damage   Alternatives discussed:  No treatment and delayed treatment Universal protocol:    Procedure explained and questions answered to patient or proxy's satisfaction: yes      Relevant documents present and verified: yes     Test results available: yes     Imaging studies available: yes     Required blood products, implants, devices, and special equipment available: yes     Site/side marked: yes     Immediately prior to procedure, a time out was called: yes     Patient identity confirmed:  Verbally with patient Anesthesia:    Anesthesia method:  Nerve block   Block needle gauge:  27 G   Block anesthetic:  Lidocaine 1% w/o epi   Block injection procedure:  Anatomic landmarks identified, anatomic landmarks palpated, introduced needle, negative aspiration for blood and incremental injection   Block outcome:  Anesthesia achieved Laceration details:    Location:  Finger   Finger location:  L small finger   Length (cm):  2   Depth (mm):  2 Pre-procedure details:    Preparation:  Patient was prepped and draped in usual sterile fashion and imaging obtained to evaluate for foreign bodies Exploration:    Limited defect created (wound extended): no     Hemostasis achieved with:  Direct pressure   Imaging outcome: foreign body not noted     Wound exploration: wound explored through full range of motion and entire depth of wound visualized     Wound extent: no foreign bodies/material noted, no nerve damage noted, no tendon damage noted and no underlying fracture noted     Contaminated: no   Treatment:    Area cleansed with:  Soap and water and saline   Amount of cleaning:  Extensive   Irrigation solution:  Sterile saline and tap water   Irrigation method:  Pressure wash   Debridement:  None   Undermining:  None   Scar revision: no   Skin repair:    Repair method:  Sutures   Suture size:  5-0   Suture material:  Nylon   Suture technique:  Simple interrupted   Number of sutures:  4 Repair type:    Repair type:  Simple Post-procedure details:    Dressing:  Non-adherent dressing   Procedure completion:  Tolerated well, no immediate  complications    MEDICATIONS ORDERED IN ED: Medications  lidocaine (PF) (XYLOCAINE) 1 % injection 5 mL (5 mLs Intradermal Given by Other 09/29/21 1058)  Tdap (BOOSTRIX) injection 0.5 mL (0.5  mLs Intramuscular Given 09/29/21 1057)     IMPRESSION / MDM / ASSESSMENT AND PLAN / ED COURSE  I reviewed the triage vital signs and the nursing notes.   Differential diagnosis includes, but is not limited to, laceration, fracture, retained foreign body.  Patient is neurovascularly intact.  Sensation intact to light touch throughout finger, do not suspect nerve injury.  Able to flex and extend at isolated DIP and PIP, do not suspect tendon injury.  No fingernail or nailbed involvement. Wound fully probed and evaluated, no retained foreign body.  X-rays negative for foreign body or osseous injury.  Wound was anesthetized and irrigated extensively, closed with sutures.  We discussed suture care and timeline for removal.  Tdap was updated.  Patient understands and agrees with plan.  Discharged in stable condition.   Patient's presentation is most consistent with acute complicated illness / injury requiring diagnostic workup.     FINAL CLINICAL IMPRESSION(S) / ED DIAGNOSES   Final diagnoses:  Laceration of left little finger without foreign body without damage to nail, initial encounter     Rx / DC Orders   ED Discharge Orders     None        Note:  This document was prepared using Dragon voice recognition software and may include unintentional dictation errors.   Emeline Gins 09/29/21 1231    Carrie Mew, MD 10/02/21 0001

## 2021-09-29 NOTE — Discharge Instructions (Signed)
You were evaluated in the emergency department for a laceration. It was repaired with sutures. Keep the area clean and dry.  Wash multiple times per day with soap and water.  Do not go into the ocean or swimming pool.  Return to the emergency department or your primary care physician's office in 7 days for suture removal.  Return to the emergency department for:  -- Fever > 100.4F -- Increase pain in the wound -- Increase redness and swelling -- Pus coming from the wound -- Wound bleeds more than a small amount or it does not stop -- Wound edges come apart -- Severe pain -- Weakness or numbness in the affected area  Or any other new or worsening symptoms. It was a pleasure caring for you.  

## 2021-09-29 NOTE — ED Triage Notes (Signed)
Pt states he smashed his left pinkie finger in a door at work today., has a small lac with controlled bleeding

## 2021-10-11 ENCOUNTER — Encounter (INDEPENDENT_AMBULATORY_CARE_PROVIDER_SITE_OTHER): Payer: Self-pay | Admitting: Nurse Practitioner

## 2021-10-11 ENCOUNTER — Ambulatory Visit (INDEPENDENT_AMBULATORY_CARE_PROVIDER_SITE_OTHER): Payer: Medicare PPO

## 2021-10-11 ENCOUNTER — Ambulatory Visit (INDEPENDENT_AMBULATORY_CARE_PROVIDER_SITE_OTHER): Payer: Medicare PPO | Admitting: Nurse Practitioner

## 2021-10-11 VITALS — BP 118/69 | HR 60 | Resp 16 | Wt 131.8 lb

## 2021-10-11 DIAGNOSIS — F17200 Nicotine dependence, unspecified, uncomplicated: Secondary | ICD-10-CM | POA: Diagnosis not present

## 2021-10-11 DIAGNOSIS — E785 Hyperlipidemia, unspecified: Secondary | ICD-10-CM

## 2021-10-11 DIAGNOSIS — I70219 Atherosclerosis of native arteries of extremities with intermittent claudication, unspecified extremity: Secondary | ICD-10-CM

## 2021-10-11 NOTE — Progress Notes (Signed)
Subjective:    Patient ID: Jerry [name], male    DOB: 1945/10/20, 76 y.o.   MRN: [id number] Chief Complaint  Patient presents with   Follow-up    Ultrasound follow up    Jerry [name] is a 76 year old male that returns to the office for followup and review of the noninvasive studies.   There have been no interval changes in lower extremity symptoms. No interval shortening of the patient's claudication distance or development of rest pain symptoms. No new ulcers or wounds have occurred since the last visit.  There have been no significant changes to the patient's overall health care.  The patient denies amaurosis fugax or recent TIA symptoms. There are no documented recent neurological changes noted. There is no history of DVT, PE or superficial thrombophlebitis. The patient denies recent episodes of angina or shortness of breath.   ABI Rt=1.02 and Lt=1.02  (previous ABI's Rt=1.01 and Lt=1.03) Duplex ultrasound of the bilateral tibial arteries reveals primarily biphasic waveforms with good toe waveforms bilaterally.    Review of Systems  Cardiovascular:  Negative for leg swelling.  Skin:  Negative for wound.  All other systems reviewed and are negative.      Objective:   Physical Exam Vitals reviewed.  HENT:     Head: Normocephalic.  Cardiovascular:     Rate and Rhythm: Normal rate.     Pulses: Normal pulses.  Pulmonary:     Effort: Pulmonary effort is normal.  Skin:    General: Skin is warm and dry.  Neurological:     Mental Status: He is alert and oriented to person, place, and time.  Psychiatric:        Mood and Affect: Mood normal.        Behavior: Behavior normal.        Thought Content: Thought content normal.        Judgment: Judgment normal.     BP 118/69 (BP Location: Right Arm)   Pulse 60   Resp 16   Wt 131 lb 12.8 oz (59.8 kg)   BMI 21.93 kg/m   Past Medical History:  Diagnosis Date   Abdominal wall mass of right lower quadrant    Abnormal  finding on EKG    Anemia    patient unaware of this diagnosis   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)    Cigarette smoker    Hearing loss    Hyperlipidemia    Leg DVT (deep venous thromboembolism), acute, left (HCC)    Peripheral vascular disease (HCC)    blockages in both extremities   Vitamin D deficiency    patient unaware of this diagnosis   Wears dentures    Full upper, Partial lower    Social History   Socioeconomic History   Marital status: Married    Spouse name: Not on file   Number of children: 6   Years of education: Not on file   Highest education level: Not on file  Occupational History   Not on file  Tobacco Use   Smoking status: Every Day    Packs/day: 0.50    Years: 40.00    Total pack years: 20.00    Types: Cigarettes   Smokeless tobacco: Never   Tobacco comments:       Vaping Use   Vaping Use: Never used  Substance and Sexual Activity   Alcohol use: Never   Drug use: Never   Sexual activity: Not Currently  Other Topics Concern  Not on file  Social History Narrative   Not on file   Social Determinants of Health   Financial Resource Strain: Not on file  Food Insecurity: Not on file  Transportation Needs: Not on file  Physical Activity: Not on file  Stress: Not on file  Social Connections: Not on file  Intimate Partner Violence: Not on file    Past Surgical History:  Procedure Laterality Date   ANGIOPLASTY ILLIAC ARTERY Left 10/03/2017   Procedure: ANGIOPLASTY ILIAC ARTERY;  Surgeon: Algernon Huxley, MD;  Location: [facility];  Service: Vascular;  Laterality: Left;   [place]   Three sx in lower back. metal in lower back   CATARACT EXTRACTION W/PHACO Left 08/17/2021   Procedure: CATARACT EXTRACTION PHACO AND INTRAOCULAR LENS PLACEMENT (IOC) LEFT 3.48 00:30.9;  Surgeon: Eulogio Bear, MD;  Location: [place];  Service: Ophthalmology;  Laterality: Left;   CATARACT EXTRACTION W/PHACO Right 09/12/2021    Procedure: CATARACT EXTRACTION PHACO AND INTRAOCULAR LENS PLACEMENT (IOC) RIGHT 5.74 00:42.3;  Surgeon: Eulogio Bear, MD;  Location: [place];  Service: Ophthalmology;  Laterality: Right;   COLONOSCOPY  01/2010   ENDARTERECTOMY FEMORAL Left 10/03/2017   Procedure: ENDARTERECTOMY FEMORAL;  Surgeon: Algernon Huxley, MD;  Location: [facility];  Service: Vascular;  Laterality: Left;   Left Leg stent Left 2009   fem fem bypass   LOWER EXTREMITY ANGIOGRAPHY Left 09/19/2017   Procedure: LOWER EXTREMITY ANGIOGRAPHY;  Surgeon: Algernon Huxley, MD;  Location: [place] [facility];  Service: Cardiovascular;  Laterality: Left;   LOWER EXTREMITY ANGIOGRAPHY Right 01/07/2018   Procedure: LOWER EXTREMITY ANGIOGRAPHY;  Surgeon: Algernon Huxley, MD;  Location: [place] [facility];  Service: Cardiovascular;  Laterality: Right;   PTCA Right 08/2017   VASCULAR SURGERY Left 2009   fem fem bypass     Family History  Problem Relation Age of Onset   Colon cancer Neg Hx     No Known Allergies     Latest Ref Rng & Units 08/15/2018    8:38 AM 08/01/2018    8:32 AM 10/05/2017    5:21 AM  CBC  WBC 4.0 - 10.5 K/uL 7.2  7.9  9.2   Hemoglobin 13.0 - 17.0 g/dL 14.4  14.3  11.5   Hematocrit 39.0 - 52.0 % 42.9  42.4  32.5   Platelets 150 - 400 K/uL 211  225  184       CMP     Component Value Date/Time   NA 141 10/05/2017 0521   NA 139 07/18/2011 0934   K 3.7 10/05/2017 0521   K 4.4 07/18/2011 0934   CL 107 10/05/2017 0521   CL 103 07/18/2011 0934   CO2 26 10/05/2017 0521   CO2 25 07/18/2011 0934   GLUCOSE 98 10/05/2017 0521   GLUCOSE 183 (H) 07/18/2011 0934   BUN 15 01/04/2018 1021   BUN 23 (H) 07/18/2011 0934   CREATININE 1.05 01/04/2018 1021   CREATININE 1.47 (H) 07/18/2011 0934   CALCIUM 8.9 10/05/2017 0521   CALCIUM 10.1 07/18/2011 0934   PROT 9.1 (H) 07/18/2011 0934   ALBUMIN 5.1 (H) 07/18/2011 0934   AST 27 07/18/2011 0934   ALT 19 07/18/2011 0934   ALKPHOS 66 07/18/2011 0934    BILITOT 1.2 (H) 07/18/2011 0934   GFRNONAA >60 01/04/2018 1021   GFRNONAA 49 (L) 07/18/2011 0934   GFRAA >60 01/04/2018 1021   GFRAA 56 (L) 07/18/2011 8756  No results found.     Assessment & Plan:   1. Atherosclerosis of lower extremity with claudication ([place])  Recommend:  The patient has evidence of atherosclerosis of the lower extremities with claudication.  The patient does not voice lifestyle limiting changes at this point in time.  Noninvasive studies do not suggest clinically significant change.  No invasive studies, angiography or surgery at this time The patient should continue walking and begin a more formal exercise program.  The patient should continue antiplatelet therapy and aggressive treatment of the lipid abnormalities  No changes in the patient's medications at this time  Continued surveillance is indicated as atherosclerosis is likely to progress with time.    The patient will continue follow up with noninvasive studies as ordered.    2. Hyperlipidemia, unspecified hyperlipidemia type Continue statin as ordered and reviewed, no changes at this time   3. Tobacco use disorder Smoking cessation was discussed, 3-10 minutes spent on this topic specifically    Current Outpatient Medications on File Prior to Visit  Medication Sig Dispense Refill   acetaminophen (TYLENOL) 325 MG tablet Take 650 mg by mouth every 6 (six) hours as needed for moderate pain.     ASPIRIN LOW DOSE 81 MG EC tablet TAKE 1 TABLET EVERY DAY 90 tablet 3   atorvastatin (LIPITOR) 10 MG tablet TAKE 1 TABLET EVERY DAY 90 tablet 3   Calcium Carbonate (CALCIUM 600 PO) Take by mouth daily.     cholecalciferol (VITAMIN D) 1000 units tablet Take 1,000 Units by mouth daily.     clopidogrel (PLAVIX) 75 MG tablet TAKE 1 TABLET EVERY DAY 90 tablet 3   docusate sodium (COLACE) 100 MG capsule Take 100 mg by mouth 2 (two) times daily.     ibuprofen (ADVIL,MOTRIN) 600 MG tablet Take 1 tablet (600 mg  total) by mouth every 8 (eight) hours as needed. 15 tablet 0   Multiple Vitamin (MULTIVITAMIN) capsule Take 1 capsule by mouth as needed.     solifenacin (VESICARE) 5 MG tablet Take 1 tablet (5 mg total) by mouth daily. 30 tablet 0   tamsulosin (FLOMAX) 0.4 MG CAPS capsule Take 0.4 mg by mouth.     No current facility-administered medications on file prior to visit.    There are no Patient Instructions on file for this visit. No follow-ups on file.   Kris Hartmann, NP

## 2022-01-23 ENCOUNTER — Encounter (INDEPENDENT_AMBULATORY_CARE_PROVIDER_SITE_OTHER): Payer: Self-pay

## 2022-03-23 ENCOUNTER — Emergency Department
Admission: EM | Admit: 2022-03-23 | Discharge: 2022-03-23 | Disposition: A | Discharge status: Home / Self Care | Payer: Medicare PPO | Attending: Emergency Medicine | Admitting: Emergency Medicine

## 2022-03-23 ENCOUNTER — Other Ambulatory Visit: Payer: Self-pay

## 2022-03-23 ENCOUNTER — Emergency Department: Payer: Medicare PPO

## 2022-03-23 DIAGNOSIS — J069 Acute upper respiratory infection, unspecified: Secondary | ICD-10-CM | POA: Insufficient documentation

## 2022-03-23 DIAGNOSIS — Z1152 Encounter for screening for COVID-19: Secondary | ICD-10-CM | POA: Insufficient documentation

## 2022-03-23 DIAGNOSIS — R21 Rash and other nonspecific skin eruption: Secondary | ICD-10-CM | POA: Diagnosis not present

## 2022-03-23 DIAGNOSIS — R051 Acute cough: Secondary | ICD-10-CM

## 2022-03-23 DIAGNOSIS — R42 Dizziness and giddiness: Secondary | ICD-10-CM | POA: Diagnosis present

## 2022-03-23 LAB — URINALYSIS, ROUTINE W REFLEX MICROSCOPIC
Bilirubin Urine: NEGATIVE
Glucose, UA: NEGATIVE mg/dL
Hgb urine dipstick: NEGATIVE
Ketones, ur: NEGATIVE mg/dL
Leukocytes,Ua: NEGATIVE
Nitrite: NEGATIVE
Protein, ur: NEGATIVE mg/dL
Specific Gravity, Urine: 1.017 (ref 1.005–1.030)
pH: 5 (ref 5.0–8.0)

## 2022-03-23 LAB — COMPREHENSIVE METABOLIC PANEL
ALT: 12 U/L (ref 0–44)
AST: 20 U/L (ref 15–41)
Albumin: 4.5 g/dL (ref 3.5–5.0)
Alkaline Phosphatase: 58 U/L (ref 38–126)
Anion gap: 10 (ref 5–15)
BUN: 16 mg/dL (ref 8–23)
CO2: 24 mmol/L (ref 22–32)
Calcium: 9.1 mg/dL (ref 8.9–10.3)
Chloride: 102 mmol/L (ref 98–111)
Creatinine, Ser: 1.3 mg/dL — ABNORMAL HIGH (ref 0.61–1.24)
GFR, Estimated: 56 mL/min — ABNORMAL LOW (ref >60)
Glucose, Bld: 121 mg/dL — ABNORMAL HIGH (ref 70–99)
Potassium: 3.8 mmol/L (ref 3.5–5.1)
Sodium: 136 mmol/L (ref 135–145)
Total Bilirubin: 1.1 mg/dL (ref 0.3–1.2)
Total Protein: 8.3 g/dL — ABNORMAL HIGH (ref 6.5–8.1)

## 2022-03-23 LAB — CBC
HCT: 46.3 % (ref 39.0–52.0)
Hemoglobin: 15.6 g/dL (ref 13.0–17.0)
MCH: 31.3 pg (ref 26.0–34.0)
MCHC: 33.7 g/dL (ref 30.0–36.0)
MCV: 93 fL (ref 80.0–100.0)
Platelets: 203 10*3/uL (ref 150–400)
RBC: 4.98 MIL/uL (ref 4.22–5.81)
RDW: 12.9 % (ref 11.5–15.5)
WBC: 7.9 10*3/uL (ref 4.0–10.5)
nRBC: 0 % (ref 0.0–0.2)

## 2022-03-23 LAB — RESP PANEL BY RT-PCR (RSV, FLU A&B, COVID)  RVPGX2
Influenza A by PCR: NEGATIVE
Influenza B by PCR: NEGATIVE
Resp Syncytial Virus by PCR: NEGATIVE
SARS Coronavirus 2 by RT PCR: NEGATIVE

## 2022-03-23 MED ORDER — AMOXICILLIN 500 MG PO CAPS: 1000.0000 mg | ORAL_CAPSULE | Freq: Three times a day (TID) | ORAL | 0 refills | Status: AC

## 2022-03-23 MED ORDER — HYDROCORTISONE 1 % EX OINT: 1.0000 | TOPICAL_OINTMENT | Freq: Two times a day (BID) | CUTANEOUS | 0 refills | Status: DC

## 2022-03-23 NOTE — ED Triage Notes (Signed)
First Nurse Note:  ARrives from [facility] for ED evaluation of 3 day history of change in gait, speech and memory.

## 2022-03-23 NOTE — ED Provider Notes (Signed)
[facility] Provider Note    Event Date/Time   First MD Initiated Contact with Patient 03/23/22 1336     (approximate)   History   Nausea, Rash, and Dizziness   HPI  Jerry [name] is a 76 y.o. male who presents with multiple complaints.  He reports that he has had fatigue and some dizziness intermittently over the last few days.  He also complains of a rash on his right leg.  Occasionally he has a "knot "that protrudes above his umbilicus.  No vomiting but intermittent nausea.     Physical Exam   Triage Vital Signs: ED Triage Vitals  Enc Vitals Group     BP 03/23/22 0933 136/82     Pulse Rate 03/23/22 0933 72     Resp 03/23/22 0933 20     Temp 03/23/22 0933 97.8 F (36.6 C)     Temp Source 03/23/22 0933 Oral     SpO2 03/23/22 0933 96 %     Weight 03/23/22 0938 60 kg (132 lb 4.4 oz)     Height 03/23/22 0938 1.651 m ('5\' 5"'$ )     Head Circumference --      Peak Flow --      Pain Score 03/23/22 0937 6     Pain Loc --      Pain Edu? --      Excl. in [place]? --     Most recent vital signs: Vitals:   03/23/22 0933 03/23/22 1355  BP: 136/82   Pulse: 72   Resp: 20 18  Temp: 97.8 F (36.6 C)   SpO2: 96%      General: Awake, no distress.  CV:  Good peripheral perfusion.  Resp:  Normal effort.  Abd:  No distention.  Soft, nontender, no clear palpable hernia Other:  Rash to the lower extremity, not consistent with cellulitis   ED Results / Procedures / Treatments   Labs (all labs ordered are listed, but only abnormal results are displayed) Labs Reviewed  URINALYSIS, ROUTINE W REFLEX MICROSCOPIC - Abnormal; Notable for the following components:      Result Value   Color, Urine YELLOW (*)    APPearance HAZY (*)    All other components within normal limits  COMPREHENSIVE METABOLIC PANEL - Abnormal; Notable for the following components:   Glucose, Bld 121 (*)    Creatinine, Ser 1.30 (*)    Total Protein 8.3 (*)    GFR, Estimated 56 (*)     All other components within normal limits  RESP PANEL BY RT-PCR (RSV, FLU A&B, COVID)  RVPGX2  CBC  CBG MONITORING, ED     EKG  ED ECG REPORT I, Lavonia Drafts, the attending physician, personally viewed and interpreted this ECG.  Date: 03/23/2022  Rhythm: normal sinus rhythm QRS Axis: normal Intervals: normal ST/T Wave abnormalities: normal Narrative Interpretation: no evidence of acute ischemia    RADIOLOGY CT head viewed interpret by me, no ICH    PROCEDURES:  Critical Care performed:   Procedures   MEDICATIONS ORDERED IN ED: Medications - No data to display   IMPRESSION / MDM / [place] / ED COURSE  I reviewed the triage vital signs and the nursing notes. Patient's presentation is most consistent with acute illness / injury with system symptoms.  Patient with multiple complaints, likely unrelated as detailed above.  Will trial hydrocortisone cream for rash, not consistent with a cellulitis.  Lab work reviewed and is quite reassuring, viral  illness suspected for fatigue and dizziness, influenza and COVID-negative  CT head negative.  CBC and CMP are reassuring.  Patient also complains of a cough which she did not initially mention, it is a loud productive cough, lungs appear clear to auscultation however will cover with amoxicillin in case patient has a early pneumonia which is causing his fatigue and dizziness      FINAL CLINICAL IMPRESSION(S) / ED DIAGNOSES   Final diagnoses:  Rash  Acute cough  Viral upper respiratory infection     Rx / DC Orders   ED Discharge Orders          Ordered    amoxicillin (AMOXIL) 500 MG capsule  3 times daily        03/23/22 1344    hydrocortisone 1 % ointment  2 times daily        03/23/22 1344             Note:  This document was prepared using Dragon voice recognition software and may include unintentional dictation errors.   Lavonia Drafts, MD 03/23/22 1620

## 2022-03-23 NOTE — ED Triage Notes (Signed)
Pt reports for the past couple of weeks he has been feeling dizzy when he stands and for the last few days he has had numbness in his left hand. Pt also reports has a rash on his right leg and general body aches all over his body.

## 2022-03-31 ENCOUNTER — Other Ambulatory Visit: Payer: Medicare PPO

## 2022-04-03 ENCOUNTER — Ambulatory Visit (INDEPENDENT_AMBULATORY_CARE_PROVIDER_SITE_OTHER): Payer: Medicare PPO | Admitting: Urology

## 2022-04-03 ENCOUNTER — Encounter: Payer: Self-pay | Admitting: Urology

## 2022-04-03 VITALS — BP 145/84 | HR 90 | Ht 65.0 in | Wt 126.0 lb

## 2022-04-03 DIAGNOSIS — R399 Unspecified symptoms and signs involving the genitourinary system: Secondary | ICD-10-CM

## 2022-04-03 DIAGNOSIS — R634 Abnormal weight loss: Secondary | ICD-10-CM | POA: Diagnosis not present

## 2022-04-03 DIAGNOSIS — C61 Malignant neoplasm of prostate: Secondary | ICD-10-CM

## 2022-04-03 DIAGNOSIS — N3941 Urge incontinence: Secondary | ICD-10-CM

## 2022-04-03 DIAGNOSIS — Z8546 Personal history of malignant neoplasm of prostate: Secondary | ICD-10-CM

## 2022-04-03 NOTE — Progress Notes (Signed)
04/03/2022 8:39 AM   Jerry [name] [birth year] [id number]  Referring provider: Marguerita Merles, [place] [address] [place],  [place] [address]  Chief Complaint  Patient presents with   Prostate Cancer    Urologic history: 1.  Prostate cancer -T2 intermediate (unfavorable) risk prostate cancer -Left prostate nodule and PI-RADS 5 lesion on MRI -Fusion biopsy 04/2018; ROI positive Gleason 4+3 -Additional positive cores on standard template 3+/4+3 -IMRT + ADT; IMRT completed 08/2018; first leuprolide 06/2018 (18 months ADT with final injection June 2021)  HPI: 77 y.o. male presents for 6 month follow-up.  Stable LUTS Complains of weight loss and some upper extremity paresthesias PSA bump to 0.30 August 2020 and PSMA PET showed no evidence of recurrent/metastatic prostate cancer   PMH: Past Medical History:  Diagnosis Date   Abdominal wall mass of right lower quadrant    Abnormal finding on EKG    Anemia    patient unaware of this diagnosis   Atherosclerosis of lower extremity with claudication ([place])    Cigarette smoker    Hearing loss    Hyperlipidemia    Leg DVT (deep venous thromboembolism), acute, left (HCC)    Peripheral vascular disease (HCC)    blockages in both extremities   Vitamin D deficiency    patient unaware of this diagnosis   Wears dentures    Full upper, Partial lower    Surgical History: Past Surgical History:  Procedure Laterality Date   ANGIOPLASTY ILLIAC ARTERY Left 10/03/2017   Procedure: ANGIOPLASTY ILIAC ARTERY;  Surgeon: Algernon Huxley, MD;  Location: [facility];  Service: Vascular;  Laterality: Left;   BACK SURGERY  1990   Three sx in lower back. metal in lower back   CATARACT EXTRACTION W/PHACO Left 08/17/2021   Procedure: CATARACT EXTRACTION PHACO AND INTRAOCULAR LENS PLACEMENT (IOC) LEFT 3.48 00:30.9;  Surgeon: Eulogio Bear, MD;  Location: [place];  Service: Ophthalmology;  Laterality: Left;   CATARACT EXTRACTION W/PHACO Right  09/12/2021   Procedure: CATARACT EXTRACTION PHACO AND INTRAOCULAR LENS PLACEMENT (IOC) RIGHT 5.74 00:42.3;  Surgeon: Eulogio Bear, MD;  Location: [place];  Service: Ophthalmology;  Laterality: Right;   COLONOSCOPY  01/2010   ENDARTERECTOMY FEMORAL Left 10/03/2017   Procedure: ENDARTERECTOMY FEMORAL;  Surgeon: Algernon Huxley, MD;  Location: [facility];  Service: Vascular;  Laterality: Left;   Left Leg stent Left 2009   fem fem bypass   LOWER EXTREMITY ANGIOGRAPHY Left 09/19/2017   Procedure: LOWER EXTREMITY ANGIOGRAPHY;  Surgeon: Algernon Huxley, MD;  Location: [place] [facility];  Service: Cardiovascular;  Laterality: Left;   LOWER EXTREMITY ANGIOGRAPHY Right 01/07/2018   Procedure: LOWER EXTREMITY ANGIOGRAPHY;  Surgeon: Algernon Huxley, MD;  Location: [place] [facility];  Service: Cardiovascular;  Laterality: Right;   PTCA Right 08/2017   VASCULAR SURGERY Left 2009   fem fem bypass     Home Medications:  Allergies as of 04/03/2022   No Known Allergies      Medication List        Accurate as of April 03, 2022  8:39 AM. If you have any questions, ask your nurse or doctor.          acetaminophen 325 MG tablet Commonly known as: TYLENOL Take 650 mg by mouth every 6 (six) hours as needed for moderate pain.   Aspirin Low Dose 81 MG tablet Generic drug: aspirin EC TAKE 1 TABLET EVERY DAY   atorvastatin 10 MG tablet Commonly  known as: LIPITOR TAKE 1 TABLET EVERY DAY   CALCIUM 600 PO Take by mouth daily.   cholecalciferol 1000 units tablet Commonly known as: VITAMIN D Take 1,000 Units by mouth daily.   clopidogrel 75 MG tablet Commonly known as: PLAVIX TAKE 1 TABLET EVERY DAY   docusate sodium 100 MG capsule Commonly known as: COLACE Take 100 mg by mouth 2 (two) times daily.   hydrocortisone 1 % ointment Apply 1 Application topically 2 (two) times daily.   ibuprofen 600 MG tablet Commonly known as: ADVIL Take 1 tablet (600 mg total) by mouth every 8  (eight) hours as needed.   multivitamin capsule Take 1 capsule by mouth as needed.   solifenacin 5 MG tablet Commonly known as: VESICARE Take 1 tablet (5 mg total) by mouth daily.   tamsulosin 0.4 MG Caps capsule Commonly known as: FLOMAX Take 0.4 mg by mouth.        Allergies: No Known Allergies  Family History: Family History  Problem Relation Age of Onset   Colon cancer Neg Hx     Social History:  reports that he has been smoking cigarettes. He has a 20.00 pack-year smoking history. He has never used smokeless tobacco. He reports that he does not drink alcohol and does not use drugs.   Physical Exam: BP (!) 145/84   Pulse 90   Ht '5\' 5"'$  (1.651 m)   Wt 126 lb (57.2 kg)   BMI 20.97 kg/m   Constitutional:  Alert and oriented, No acute distress. HEENT: [place] AT Respiratory: Normal respiratory effort, no increased work of breathing.   Assessment & Plan:    1.  T2 intermediate risk prostate cancer Recent PSMA/PET showed no evidence of recurrent/metastatic disease PSA today If stable follow-up 6 months  2.  Lower urinary tract symptoms Stable on tamsulosin/Solifenacin  3.  Weight loss Recommend PCP follow-up for further evaluation   Abbie Sons, [facility] [address] [place], [place] [address] [phone]

## 2022-04-04 LAB — PSA: Prostate Specific Ag, Serum: 0.2 ng/mL (ref 0.0–4.0)

## 2022-04-05 ENCOUNTER — Telehealth: Payer: Self-pay | Admitting: *Deleted

## 2022-04-05 NOTE — Telephone Encounter (Signed)
-----   Message from Abbie Sons, MD sent at 04/04/2022  8:40 PM EST ----- PSA looks good at 0.2.  Follow-up as scheduled

## 2022-04-05 NOTE — Telephone Encounter (Signed)
Left message per [facility]

## 2022-04-14 ENCOUNTER — Telehealth: Payer: Self-pay

## 2022-04-14 NOTE — Telephone Encounter (Signed)
Tried reaching out to patient to confirm new patient appointment with Dr. Grayland Ormond. No answer and was unable to leave a VM.

## 2022-04-17 ENCOUNTER — Encounter: Payer: Self-pay | Admitting: Oncology

## 2022-04-17 ENCOUNTER — Inpatient Hospital Stay: Payer: Medicare PPO | Attending: Oncology | Admitting: Oncology

## 2022-04-17 ENCOUNTER — Inpatient Hospital Stay: Payer: Medicare PPO

## 2022-04-17 VITALS — BP 133/88 | HR 58 | Temp 96.6°F | Resp 16 | Ht 65.0 in | Wt 127.0 lb

## 2022-04-17 DIAGNOSIS — R634 Abnormal weight loss: Secondary | ICD-10-CM

## 2022-04-17 DIAGNOSIS — C61 Malignant neoplasm of prostate: Secondary | ICD-10-CM | POA: Insufficient documentation

## 2022-04-17 DIAGNOSIS — F17210 Nicotine dependence, cigarettes, uncomplicated: Secondary | ICD-10-CM | POA: Insufficient documentation

## 2022-04-17 DIAGNOSIS — Z79899 Other long term (current) drug therapy: Secondary | ICD-10-CM | POA: Diagnosis not present

## 2022-04-17 DIAGNOSIS — Z8546 Personal history of malignant neoplasm of prostate: Secondary | ICD-10-CM | POA: Insufficient documentation

## 2022-04-17 DIAGNOSIS — N402 Nodular prostate without lower urinary tract symptoms: Secondary | ICD-10-CM

## 2022-04-17 LAB — COMPREHENSIVE METABOLIC PANEL
ALT: 9 U/L (ref 0–44)
AST: 19 U/L (ref 15–41)
Albumin: 4.5 g/dL (ref 3.5–5.0)
Alkaline Phosphatase: 46 U/L (ref 38–126)
Anion gap: 10 (ref 5–15)
BUN: 12 mg/dL (ref 8–23)
CO2: 24 mmol/L (ref 22–32)
Calcium: 9 mg/dL (ref 8.9–10.3)
Chloride: 102 mmol/L (ref 98–111)
Creatinine, Ser: 1.19 mg/dL (ref 0.61–1.24)
GFR, Estimated: >60 mL/min (ref >60)
Glucose, Bld: 94 mg/dL (ref 70–99)
Potassium: 4.1 mmol/L (ref 3.5–5.1)
Sodium: 136 mmol/L (ref 135–145)
Total Bilirubin: 1.1 mg/dL (ref 0.3–1.2)
Total Protein: 7.8 g/dL (ref 6.5–8.1)

## 2022-04-17 LAB — CBC WITH DIFFERENTIAL/PLATELET
Abs Immature Granulocytes: 0.06 10*3/uL (ref 0.00–0.07)
Basophils Absolute: 0 10*3/uL (ref 0.0–0.1)
Basophils Relative: 1 %
Eosinophils Absolute: 0.3 10*3/uL (ref 0.0–0.5)
Eosinophils Relative: 6 %
HCT: 42.9 % (ref 39.0–52.0)
Hemoglobin: 14.9 g/dL (ref 13.0–17.0)
Immature Granulocytes: 1 %
Lymphocytes Relative: 27 %
Lymphs Abs: 1.4 10*3/uL (ref 0.7–4.0)
MCH: 31.3 pg (ref 26.0–34.0)
MCHC: 34.7 g/dL (ref 30.0–36.0)
MCV: 90.1 fL (ref 80.0–100.0)
Monocytes Absolute: 0.6 10*3/uL (ref 0.1–1.0)
Monocytes Relative: 11 %
Neutro Abs: 2.8 10*3/uL (ref 1.7–7.7)
Neutrophils Relative %: 54 %
Platelets: 191 10*3/uL (ref 150–400)
RBC: 4.76 MIL/uL (ref 4.22–5.81)
RDW: 13.2 % (ref 11.5–15.5)
WBC: 5.1 10*3/uL (ref 4.0–10.5)
nRBC: 0 % (ref 0.0–0.2)

## 2022-04-17 NOTE — Progress Notes (Signed)
[place] Work  Initial Assessment   Jerry [name] is a 77 y.o. year old male contacted by phone. Clinical Social Work was referred by medical provider for assessment of psychosocial needs.   SDOH (Social Determinants of Health) assessments performed: Yes SDOH Interventions    Flowsheet Row Office Visit from 04/17/2022 in [place] at [place] Interventions   Housing Interventions Other (Comment)       SDOH Screenings   Food Insecurity: Food Insecurity Present (04/17/2022)  Housing: Medium Risk (04/17/2022)  Transportation Needs: No Transportation Needs (04/17/2022)  Utilities: Not At Risk (04/17/2022)  Depression (PHQ2-9): Low Risk  (04/17/2022)  Financial Resource Strain: Medium Risk (04/17/2022)  Tobacco Use: High Risk (04/17/2022)     Distress Screen completed: No     No data to display            Family/Social Information:  Housing Arrangement: patient lives with his wife Jerry [name]. Family members/support persons in your life? Family, Friends, and [employer] concerns: yes  Employment: Working part time at a school. Income source: Employment Financial concerns: Yes, due to illness and/or loss of work during treatment Type of concern: Secretary/administrator access concerns: no Religious or spiritual practice: Yes-Patient belongs to [employer] [place] Currently in place:  Medicare.  Coping/ Adjustment to diagnosis: Patient understands treatment plan and what happens next? yes Concerns about diagnosis and/or treatment: Losing my job and/or losing income Patient reported stressors: Transportation.  Patient is having difficulty making his car payments. Hopes and/or priorities: Family Patient enjoys time with family/ friends Current coping skills/ strengths: Capable of independent living , General fund of knowledge , Physical Health , Religious Affiliation , and Supportive family/friends     SUMMARY: Current  SDOH Barriers:  Transportation  Clinical Social Work Clinical Goal(s):  Explore community resource options for unmet needs related to:  Transportation  Interventions: Discussed common feeling and emotions when being diagnosed with cancer, and the importance of support during treatment Informed patient of the support team roles and support services at [facility] Provided [place] contact information and encouraged patient to call with any questions or concerns Provided education regarding the [employer] and made referral to [employer].  Patient stated his other needs were being met.  Informed him that Jerry Amas, LCSW, is the primary CSW for [facility] and that she would return next week.  He said he understood.   Follow Up Plan: Patient will contact CSW with any support or resource needs Patient verbalizes understanding of plan: Yes    Rodman Pickle Ja Ohman, LCSW

## 2022-04-17 NOTE — Progress Notes (Signed)
[place]  [phone] [phone]  ID: Jerry [name] OB: [birth year]  MR#: [id number]  AOZ#:[id number]  Patient Care Team: Marguerita Merles, MD as PCP - General (Family Medicine)  CHIEF COMPLAINT: Unintentional weight loss, history of stage IIc prostate cancer.  INTERVAL HISTORY: Patient is a 77 year old male with a history of prostate cancer treated with IMRT and Lupron.  Patient is a poor historian.  He complains of weight loss, but timeframe is unclear.  He has no neurologic complaints.  He denies any recent fevers or illnesses.  He has a fair appetite.  He has no chest pain, shortness of breath, cough, or hemoptysis.  He denies any nausea, vomiting, constipation, or diarrhea.  He has no urinary complaints.  Patient offers no further specific complaints today.  REVIEW OF SYSTEMS:   Review of Systems  Constitutional:  Positive for weight loss. Negative for fever and malaise/fatigue.  Respiratory: Negative.  Negative for cough, hemoptysis and shortness of breath.   Cardiovascular: Negative.  Negative for chest pain and leg swelling.  Gastrointestinal: Negative.  Negative for abdominal pain, blood in stool and melena.  Genitourinary: Negative.  Negative for dysuria.  Musculoskeletal: Negative.  Negative for back pain.  Skin: Negative.  Negative for rash.  Neurological: Negative.  Negative for dizziness, focal weakness, weakness and headaches.  Psychiatric/Behavioral: Negative.  The patient is not nervous/anxious.     As per HPI. Otherwise, a complete review of systems is negative.  PAST MEDICAL HISTORY: Past Medical History:  Diagnosis Date   Abdominal wall mass of right lower quadrant    Abnormal finding on EKG    Anemia    patient unaware of this diagnosis   Atherosclerosis of lower extremity with claudication ([place])    Cigarette smoker    Hearing loss    Hyperlipidemia    Leg DVT (deep venous thromboembolism), acute, left (HCC)     Peripheral vascular disease (HCC)    blockages in both extremities   Vitamin D deficiency    patient unaware of this diagnosis   Wears dentures    Full upper, Partial lower    PAST SURGICAL HISTORY: Past Surgical History:  Procedure Laterality Date   ANGIOPLASTY ILLIAC ARTERY Left 10/03/2017   Procedure: ANGIOPLASTY ILIAC ARTERY;  Surgeon: Algernon Huxley, MD;  Location: [facility];  Service: Vascular;  Laterality: Left;   BACK SURGERY  1990   Three sx in lower back. metal in lower back   CATARACT EXTRACTION W/PHACO Left 08/17/2021   Procedure: CATARACT EXTRACTION PHACO AND INTRAOCULAR LENS PLACEMENT (IOC) LEFT 3.48 00:30.9;  Surgeon: Eulogio Bear, MD;  Location: [place];  Service: Ophthalmology;  Laterality: Left;   CATARACT EXTRACTION W/PHACO Right 09/12/2021   Procedure: CATARACT EXTRACTION PHACO AND INTRAOCULAR LENS PLACEMENT (IOC) RIGHT 5.74 00:42.3;  Surgeon: Eulogio Bear, MD;  Location: [place];  Service: Ophthalmology;  Laterality: Right;   COLONOSCOPY  01/2010   ENDARTERECTOMY FEMORAL Left 10/03/2017   Procedure: ENDARTERECTOMY FEMORAL;  Surgeon: Algernon Huxley, MD;  Location: [facility];  Service: Vascular;  Laterality: Left;   Left Leg stent Left 2009   fem fem bypass   LOWER EXTREMITY ANGIOGRAPHY Left 09/19/2017   Procedure: LOWER EXTREMITY ANGIOGRAPHY;  Surgeon: Algernon Huxley, MD;  Location: [place] [facility];  Service: Cardiovascular;  Laterality: Left;   LOWER EXTREMITY ANGIOGRAPHY Right 01/07/2018   Procedure: LOWER EXTREMITY ANGIOGRAPHY;  Surgeon: Algernon Huxley, MD;  Location: [place] [facility];  Service: Cardiovascular;  Laterality: Right;   PTCA Right 08/2017   VASCULAR SURGERY Left 2009   fem fem bypass     FAMILY HISTORY: Family History  Problem Relation Age of Onset   Colon cancer Neg Hx     ADVANCED DIRECTIVES (Y/N):  N  HEALTH MAINTENANCE: Social History   Tobacco Use   Smoking status: Every Day    Packs/day: 0.50     Years: 40.00    Total pack years: 20.00    Types: Cigarettes   Smokeless tobacco: Never   Tobacco comments:       Vaping Use   Vaping Use: Never used  Substance Use Topics   Alcohol use: Never   Drug use: Never     Colonoscopy:  PAP:  Bone density:  Lipid panel:  No Known Allergies  Current Outpatient Medications  Medication Sig Dispense Refill   acetaminophen (TYLENOL) 325 MG tablet Take 650 mg by mouth every 6 (six) hours as needed for moderate pain. (Patient not taking: Reported on 04/17/2022)     ASPIRIN LOW DOSE 81 MG EC tablet TAKE 1 TABLET EVERY DAY (Patient not taking: Reported on 04/17/2022) 90 tablet 3   atorvastatin (LIPITOR) 10 MG tablet TAKE 1 TABLET EVERY DAY (Patient not taking: Reported on 04/17/2022) 90 tablet 3   Calcium Carbonate (CALCIUM 600 PO) Take by mouth daily. (Patient not taking: Reported on 04/17/2022)     cholecalciferol (VITAMIN D) 1000 units tablet Take 1,000 Units by mouth daily. (Patient not taking: Reported on 04/17/2022)     clopidogrel (PLAVIX) 75 MG tablet TAKE 1 TABLET EVERY DAY (Patient not taking: Reported on 04/17/2022) 90 tablet 3   docusate sodium (COLACE) 100 MG capsule Take 100 mg by mouth 2 (two) times daily. (Patient not taking: Reported on 04/17/2022)     hydrocortisone 1 % ointment Apply 1 Application topically 2 (two) times daily. (Patient not taking: Reported on 04/17/2022) 30 g 0   ibuprofen (ADVIL,MOTRIN) 600 MG tablet Take 1 tablet (600 mg total) by mouth every 8 (eight) hours as needed. (Patient not taking: Reported on 04/17/2022) 15 tablet 0   Multiple Vitamin (MULTIVITAMIN) capsule Take 1 capsule by mouth as needed. (Patient not taking: Reported on 04/17/2022)     solifenacin (VESICARE) 5 MG tablet Take 1 tablet (5 mg total) by mouth daily. (Patient not taking: Reported on 04/17/2022) 30 tablet 0   tamsulosin (FLOMAX) 0.4 MG CAPS capsule Take 0.4 mg by mouth. (Patient not taking: Reported on 04/17/2022)     No current  facility-administered medications for this visit.    OBJECTIVE: Vitals:   04/17/22 0851  BP: 133/88  Pulse: (!) 58  Resp: 16  Temp: (!) 96.6 F (35.9 C)  SpO2: 100%     Body mass index is 21.13 kg/m.    ECOG FS:0 - Asymptomatic  General: Well-developed, well-nourished, no acute distress. Eyes: Pink conjunctiva, anicteric sclera. HEENT: Normocephalic, moist mucous membranes. Lungs: No audible wheezing or coughing. Heart: Regular rate and rhythm. Abdomen: Soft, nontender, no obvious distention. Musculoskeletal: No edema, cyanosis, or clubbing. Neuro: Alert, answering all questions appropriately. Cranial nerves grossly intact. Skin: No rashes or petechiae noted. Psych: Normal affect. Lymphatics: No cervical, calvicular, axillary or inguinal LAD.   LAB RESULTS:  Lab Results  Component Value Date   NA 136 04/17/2022   K 4.1 04/17/2022   CL 102 04/17/2022   CO2 24 04/17/2022   GLUCOSE 94 04/17/2022   BUN 12 04/17/2022   CREATININE 1.19 04/17/2022  CALCIUM 9.0 04/17/2022   PROT 7.8 04/17/2022   ALBUMIN 4.5 04/17/2022   AST 19 04/17/2022   ALT 9 04/17/2022   ALKPHOS 46 04/17/2022   BILITOT 1.1 04/17/2022   GFRNONAA >60 04/17/2022   GFRAA >60 01/04/2018    Lab Results  Component Value Date   WBC 5.1 04/17/2022   NEUTROABS 2.8 04/17/2022   HGB 14.9 04/17/2022   HCT 42.9 04/17/2022   MCV 90.1 04/17/2022   PLT 191 04/17/2022     STUDIES: CT Head Wo Contrast  Result Date: 03/23/2022 CLINICAL DATA:  77 year old male with dizziness for the past few weeks. More recent onset left hand numbness. Generalized body ache. Lower extremity rash. EXAM: CT HEAD WITHOUT CONTRAST TECHNIQUE: Contiguous axial images were obtained from the base of the skull through the vertex without intravenous contrast. RADIATION DOSE REDUCTION: This exam was performed according to the departmental dose-optimization program which includes automated exposure control, adjustment of the mA and/or kV  according to patient size and/or use of iterative reconstruction technique. COMPARISON:  Head CT 11/05/2005. FINDINGS: Brain: Cerebral volume is within normal limits for age. No midline shift, ventriculomegaly, mass effect, evidence of mass lesion, intracranial hemorrhage or evidence of cortically based acute infarction. Gray-white differentiation is within normal limits for age. No convincing encephalomalacia. Vascular: Calcified atherosclerosis at the skull base.6 No suspicious intracranial vascular hyperdensity. Skull: Chronic anterior left maxillary deformity is stable since 2007. Congenital incomplete ossification of the posterior C1 ring. No acute osseous abnormality identified. Sinuses/Orbits: Bilateral maxillary sinus mucoperiosteal thickening is chronic but progressed since 2007. Mild to moderate additional bilateral ethmoid and sphenoid sinus mucosal thickening. Tympanic cavities and mastoids are clear. Other: No acute orbit or scalp soft tissue finding. IMPRESSION: 1. Normal for age non contrast CT appearance of the Brain. 2. Bilateral paranasal sinus disease appears chronic versus acute on chronic, and progressed since 2007. Electronically Signed   By: Genevie Ann M.D.   On: 03/23/2022 09:59    ASSESSMENT: Unintentional weight loss, history of stage IIc prostate cancer.  PLAN:    Unintentional weight loss: Unclear etiology.  Patient is a poor historian and difficult to ascertain length of time.  Complete metabolic panel and CBC from today are within normal limits.  TSH and thyroid panel are pending at time of dictation.  Will get CT of chest, abdomen, and pelvis for completeness.  Return to clinic in approximately 3 weeks to discuss the results. History of stage IIc prostate cancer: Diagnosed in February 2020, Gleason's 4+3.  Patient underwent IMRT in June 2020.  He also received several injections of Lupron most recently in June 2021.  His most recent PSA on April 03, 2022 was reported 0.2.   Continue follow-up with urology as scheduled.  Patient expressed understanding and was in agreement with this plan. He also understands that He can call clinic at any time with any questions, concerns, or complaints.    Cancer Staging  Prostate cancer [facility]) Staging form: Prostate, AJCC 8th Edition - Clinical stage from 04/17/2022: Stage IIC (cT2a, cN0, cM0, PSA: 3.7, Grade Group: 3) - Signed by Lloyd Huger, MD on 04/17/2022 Stage prefix: Initial diagnosis Prostate specific antigen (PSA) range: Less than 10 Gleason score: 7 Histologic grading system: 5 grade system   Lloyd Huger, MD   04/17/2022 1:09 PM

## 2022-04-17 NOTE — Progress Notes (Signed)
Patient ID: Jerry [name], male   DOB: [birth year], 77 y.o.   MRN: [id number]  Chief Complaint: Right-sided periumbilical discomfort  History of Present Illness Jerry [name] is a 77 y.o. male with a past intermittent pain to the right side of the periumbilical area, seemingly exacerbated with coughing.  Patient denies any exacerbation with heavy weightlifting.  He reports he can massage the area bit and make it better.  Reportedly a lump goes away.  He denies any pain in either groin area, denying any bulge there either.  He has a CT scan scheduled for January 29.  Past Medical History Past Medical History:  Diagnosis Date   Abdominal wall mass of right lower quadrant    Abnormal finding on EKG    Anemia    patient unaware of this diagnosis   Atherosclerosis of lower extremity with claudication ([place])    Cigarette smoker    Hearing loss    Hyperlipidemia    Leg DVT (deep venous thromboembolism), acute, left (HCC)    Peripheral vascular disease (HCC)    blockages in both extremities   Vitamin D deficiency    patient unaware of this diagnosis   Wears dentures    Full upper, Partial lower      Past Surgical History:  Procedure Laterality Date   ANGIOPLASTY ILLIAC ARTERY Left 10/03/2017   Procedure: ANGIOPLASTY ILIAC ARTERY;  Surgeon: Algernon Huxley, MD;  Location: [facility];  Service: Vascular;  Laterality: Left;   BACK SURGERY  1990   Three sx in lower back. metal in lower back   CATARACT EXTRACTION W/PHACO Left 08/17/2021   Procedure: CATARACT EXTRACTION PHACO AND INTRAOCULAR LENS PLACEMENT (IOC) LEFT 3.48 00:30.9;  Surgeon: Eulogio Bear, MD;  Location: [facility];  Service: Ophthalmology;  Laterality: Left;   CATARACT EXTRACTION W/PHACO Right 09/12/2021   Procedure: CATARACT EXTRACTION PHACO AND INTRAOCULAR LENS PLACEMENT (IOC) RIGHT 5.74 00:42.3;  Surgeon: Eulogio Bear, MD;  Location: [facility];  Service: Ophthalmology;  Laterality: Right;   COLONOSCOPY   01/2010   ENDARTERECTOMY FEMORAL Left 10/03/2017   Procedure: ENDARTERECTOMY FEMORAL;  Surgeon: Algernon Huxley, MD;  Location: [facility];  Service: Vascular;  Laterality: Left;   Left Leg stent Left 2009   fem fem bypass   LOWER EXTREMITY ANGIOGRAPHY Left 09/19/2017   Procedure: LOWER EXTREMITY ANGIOGRAPHY;  Surgeon: Algernon Huxley, MD;  Location: [facility];  Service: Cardiovascular;  Laterality: Left;   LOWER EXTREMITY ANGIOGRAPHY Right 01/07/2018   Procedure: LOWER EXTREMITY ANGIOGRAPHY;  Surgeon: Algernon Huxley, MD;  Location: [facility];  Service: Cardiovascular;  Laterality: Right;   PTCA Right 08/2017   VASCULAR SURGERY Left 2009   fem fem bypass     No Known Allergies  No current outpatient medications on file.   No current facility-administered medications for this visit.    Family History Family History  Problem Relation Age of Onset   Colon cancer Neg Hx       Social History Social History   Tobacco Use   Smoking status: Every Day    Packs/day: 0.50    Years: 40.00    Total pack years: 20.00    Types: Cigarettes   Smokeless tobacco: Never   Tobacco comments:       Vaping Use   Vaping Use: Never used  Substance Use Topics   Alcohol use: Never   Drug use: Never        Review of  Systems  Unable to perform ROS: Other     Physical Exam Blood pressure 123/78, pulse 67, temperature 98 F (36.7 C), temperature source Oral, height 5' 5.5" (1.664 m), weight 129 lb 9.6 oz (58.8 kg), SpO2 98 %. Last Weight  Most recent update: 04/18/2022  9:34 AM    Weight  58.8 kg (129 lb 9.6 oz)             CONSTITUTIONAL: Well developed, and nourished, appropriately responsive and aware without distress.   EYES: Sclera non-icteric.   EARS, NOSE, MOUTH AND THROAT:  The oropharynx is clear. Oral mucosa is pink and moist.   Hearing is intact to voice.  NECK: Trachea is midline, and there is no jugular venous distension.  LYMPH NODES:  Lymph nodes in the  neck are not appreciated. RESPIRATORY:  Lungs are clear, and breath sounds are equal bilaterally. Normal respiratory effort without pathologic use of accessory muscles. CARDIOVASCULAR: Heart is regular in rate and rhythm.  Well perfused.  GI: The abdomen is soft, nontender, and nondistended. There were no palpable masses. I did not appreciate hepatosplenomegaly. There were normal bowel sounds. GU: None circumcised phallus.  Testes descended bilaterally without appreciable abnormality.  There is readily palpable Valsalva wave in the left groin, far greater than the right.  No remarkable bulge appreciated in either groin, no appreciable hernia sac. MUSCULOSKELETAL:  Symmetrical muscle tone appreciated in all four extremities.    SKIN: Skin turgor is normal. No pathologic skin lesions appreciated.  NEUROLOGIC:  Motor and sensation appear grossly normal.  Cranial nerves are grossly without defect. PSYCH:  Alert and oriented to person, place and time. Affect is appropriate for situation.  Data Reviewed I have personally reviewed what is currently available of the patient's imaging, recent labs and medical records.   Labs:     Latest Ref Rng & Units 04/17/2022    9:52 AM 03/23/2022    9:41 AM 08/15/2018    8:38 AM  CBC  WBC 4.0 - 10.5 K/uL 5.1  7.9  7.2   Hemoglobin 13.0 - 17.0 g/dL 14.9  15.6  14.4   Hematocrit 39.0 - 52.0 % 42.9  46.3  42.9   Platelets 150 - 400 K/uL 191  203  211       Latest Ref Rng & Units 04/17/2022    9:52 AM 03/23/2022    9:41 AM 01/04/2018   10:21 AM  CMP  Glucose 70 - 99 mg/dL 94  121    BUN 8 - 23 mg/dL '12  16  15   '$ Creatinine 0.61 - 1.24 mg/dL 1.19  1.30  1.05   Sodium 135 - 145 mmol/L 136  136    Potassium 3.5 - 5.1 mmol/L 4.1  3.8    Chloride 98 - 111 mmol/L 102  102    CO2 22 - 32 mmol/L 24  24    Calcium 8.9 - 10.3 mg/dL 9.0  9.1    Total Protein 6.5 - 8.1 g/dL 7.8  8.3    Total Bilirubin 0.3 - 1.2 mg/dL 1.1  1.1    Alkaline Phos 38 - 126 U/L 46  58     AST 15 - 41 U/L 19  20    ALT 0 - 44 U/L 9  12        Imaging: Radiological images reviewed:  CT pending Within last 24 hrs: No results found.  Assessment    Periumbilical pain, potentially an area of the spigelian hernia.  Nothing on clinical exam. Does not appear to be symptomatic regarding possible patency of left inguinal canal. Patient Active Problem List   Diagnosis Date Noted   Prostate cancer ([place]) 04/17/2022   Prostate nodule 02/12/2018   Abnormal MRI, pelvis 02/12/2018   Incontinence of urine 12/21/2017   Atherosclerosis with claudication of extremity ([place]) 10/03/2017   Abnormal finding on EKG 09/25/2017   Abdominal wall mass of right lower quadrant 09/14/2017   Loss of weight 09/14/2017   Hyperlipidemia 09/04/2017   Tobacco use disorder 09/04/2017   Atherosclerosis of lower extremity with claudication ([place]) 09/04/2017   Claudication ([place]) 06/14/2017    Plan    Doubt spigelian hernia, groin hernias do not appear to be significant nor symptomatic at this point in time.  Will follow-up CT scan examination and revisit this patient shortly after.  Face-to-face time spent with the patient and accompanying care providers(if present) was 30 minutes, with more than 50% of the time spent counseling, educating, and coordinating care of the patient.    These notes generated with voice recognition software. I apologize for typographical errors.  Ronny Bacon M.D., FACS 04/18/2022, 10:07 AM

## 2022-04-18 ENCOUNTER — Ambulatory Visit (INDEPENDENT_AMBULATORY_CARE_PROVIDER_SITE_OTHER): Payer: Medicare PPO | Admitting: Surgery

## 2022-04-18 ENCOUNTER — Encounter: Payer: Self-pay | Admitting: Surgery

## 2022-04-18 VITALS — BP 123/78 | HR 67 | Temp 98.0°F | Ht 65.5 in | Wt 129.6 lb

## 2022-04-18 DIAGNOSIS — R1033 Periumbilical pain: Secondary | ICD-10-CM

## 2022-04-18 NOTE — Patient Instructions (Signed)
If you have any concerns or questions, please feel free to call our office.   Hernia, Adult     A hernia happens when an organ or tissue inside your body pushes out through a weak spot in the muscles of your belly (abdomen). This makes a bulge. The bulge may be: In a scar from a surgery that was done in your belly (incisional hernia). Near your belly button (umbilical hernia). In your groin (inguinal hernia). Your groin is the area where your leg meets your lower belly. If you are a male, this type could also be in your scrotum. In your upper thigh (femoral hernia). Inside your belly (hiatal hernia). This happens when your stomach slides above the muscle between your belly and your chest (diaphragm). What are the causes? This condition may be caused by: Lifting heavy things. Coughing over a long period of time. Having trouble pooping (constipation). Trouble pooping can lead to straining. A cut from surgery in your belly. A physical problem that is present at birth. Being very overweight. Smoking. Too much fluid in your belly. A testicle that has not moved down into the scrotum, in males. What are the signs or symptoms? The main symptom is a bulge in the area of the hernia, but a bulge may not always be seen. It may grow bigger or be easier to see when you cough or strain (such as when lifting something heavy). A hernia that can be pushed back into the belly rarely causes pain. A hernia that cannot be pushed back into the belly may lose its blood supply. This may cause: Pain. Fever. A feeling like you may vomit, and vomiting. Swelling. Trouble pooping. How is this treated? A hernia that is small and painless may not need to be treated. A hernia that is large or painful may be treated with surgery. Surgery to treat a hernia involves pushing the bulge back into place and repairing the weak area of the muscle or belly. Follow these instructions at home: Activity Avoid straining the  muscles near your hernia. This can happen when you: Lift something heavy. Poop (have a bowel movement). Do not lift anything that is heavier than 10 lb (4.5 kg), or the limit that you are told. When you lift something heavy, use your leg muscles. Do not use your back muscles to lift. Prevent trouble pooping If told by your doctor, take steps to prevent trouble pooping. You may need to: Drink enough fluid to keep your pee (urine) pale yellow. Take medicines. You will be told what medicines to take. Eat foods that are high in fiber. These include beans, whole grains, and fresh fruits and vegetables. Limit foods that are high in fat and sugar. These include fried or sweet foods. General instructions When you cough, try to cough gently. You may try to push your hernia back in by gently pressing on it when you are lying down. Do not try to force the bulge back in if it will not go in easily. If you are overweight, work with your doctor to lose weight safely. Do not smoke or use any products that contain nicotine or tobacco. If you need help quitting, ask your doctor. If you will be having surgery, watch your hernia for changes in shape, size, or color. Tell your doctor if you see any changes. Take over-the-counter and prescription medicines only as told by your doctor. Keep all follow-up visits. Contact a doctor if: You get new pain, swelling, or redness near your  hernia. You poop fewer times in a week than normal. You have trouble pooping. You have poop that is more dry than normal. You have poop that is harder or larger than normal. Get help right away if: You have a fever or chills. You have belly pain that gets worse. You feel like you may vomit, or you vomit. Your hernia cannot be pushed in by gently pressing on it when you are lying down. Your hernia: Changes in shape or size. Changes color. Feels hard, or it hurts when you touch it. These symptoms may be an emergency. Get help  right away. Call your local emergency services (911 in the [place] Do not wait to see if the symptoms will go away. Do not drive yourself to the hospital. Summary A hernia happens when an organ or tissue inside your body pushes out through a weak spot in the belly muscles. This creates a bulge. If your hernia is small and it does not hurt, you may not need treatment. If your hernia is large or it hurts, you may need surgery. If you will be having surgery, watch your hernia for changes in shape, size, or color. Tell your doctor about any changes. This information is not intended to replace advice given to you by your health care provider. Make sure you discuss any questions you have with your health care provider. Document Revised: 10/20/2019 Document Reviewed: 10/20/2019 Elsevier Patient Education  [place].

## 2022-04-19 LAB — THYROID PANEL WITH TSH
Free Thyroxine Index: 2 (ref 1.2–4.9)
T3 Uptake Ratio: 25 % (ref 24–39)
T4, Total: 7.8 ug/dL (ref 4.5–12.0)
TSH: 1.16 u[IU]/mL (ref 0.450–4.500)

## 2022-04-20 NOTE — Progress Notes (Signed)
Spoke with patient regarding the Jerry [name], he will bring in proof of income at his next apt, 04/24/2022.

## 2022-04-24 ENCOUNTER — Ambulatory Visit
Admission: RE | Admit: 2022-04-24 | Discharge: 2022-04-24 | Disposition: A | Discharge status: Home / Self Care | Payer: Medicare PPO | Source: Ambulatory Visit | Attending: Oncology | Admitting: Oncology

## 2022-04-24 DIAGNOSIS — I2510 Atherosclerotic heart disease of native coronary artery without angina pectoris: Secondary | ICD-10-CM | POA: Diagnosis not present

## 2022-04-24 DIAGNOSIS — K760 Fatty (change of) liver, not elsewhere classified: Secondary | ICD-10-CM | POA: Insufficient documentation

## 2022-04-24 DIAGNOSIS — C61 Malignant neoplasm of prostate: Secondary | ICD-10-CM | POA: Diagnosis not present

## 2022-04-24 DIAGNOSIS — R634 Abnormal weight loss: Secondary | ICD-10-CM | POA: Diagnosis present

## 2022-04-24 DIAGNOSIS — R911 Solitary pulmonary nodule: Secondary | ICD-10-CM | POA: Diagnosis not present

## 2022-04-24 MED ORDER — IOHEXOL 300 MG/ML  SOLN
80.0000 mL | Freq: Once | INTRAMUSCULAR | Status: AC | PRN
  Administered 2022-04-24: 80 mL via INTRAVENOUS

## 2022-04-27 ENCOUNTER — Ambulatory Visit: Payer: Medicare PPO | Admitting: Surgery

## 2022-05-08 ENCOUNTER — Encounter: Payer: Self-pay | Admitting: Oncology

## 2022-05-08 ENCOUNTER — Inpatient Hospital Stay: Payer: Medicare PPO | Attending: Oncology | Admitting: Oncology

## 2022-05-08 VITALS — BP 134/74 | HR 55 | Temp 96.5°F | Resp 16 | Ht 65.5 in | Wt 129.0 lb

## 2022-05-08 DIAGNOSIS — R634 Abnormal weight loss: Secondary | ICD-10-CM | POA: Diagnosis present

## 2022-05-08 DIAGNOSIS — Z8546 Personal history of malignant neoplasm of prostate: Secondary | ICD-10-CM | POA: Insufficient documentation

## 2022-05-08 DIAGNOSIS — Z86718 Personal history of other venous thrombosis and embolism: Secondary | ICD-10-CM | POA: Insufficient documentation

## 2022-05-08 DIAGNOSIS — F17210 Nicotine dependence, cigarettes, uncomplicated: Secondary | ICD-10-CM | POA: Diagnosis not present

## 2022-05-08 DIAGNOSIS — R911 Solitary pulmonary nodule: Secondary | ICD-10-CM | POA: Insufficient documentation

## 2022-05-08 NOTE — Progress Notes (Signed)
[facility]  [phone] [phone]  ID: Jerry [name] OB: [birth year]  MR#: [id number]  [id number]  Patient Care Team: Marguerita Merles, MD as PCP - General (Family Medicine)  CHIEF COMPLAINT: Unintentional weight loss, history of stage IIc prostate cancer.  INTERVAL HISTORY: Patient returns to clinic today for further evaluation and discussion of his imaging results.  He currently feels well and is at his baseline.  He does not complain of any further weight loss.  He has no neurologic complaints.  He denies any recent fevers or illnesses.  He has a fair appetite.  He has no chest pain, shortness of breath, cough, or hemoptysis.  He denies any nausea, vomiting, constipation, or diarrhea.  He has no urinary complaints.  Patient offers no specific complaints today.  REVIEW OF SYSTEMS:   Review of Systems  Constitutional: Negative.  Negative for fever, malaise/fatigue and weight loss.  Respiratory: Negative.  Negative for cough, hemoptysis and shortness of breath.   Cardiovascular: Negative.  Negative for chest pain and leg swelling.  Gastrointestinal: Negative.  Negative for abdominal pain, blood in stool and melena.  Genitourinary: Negative.  Negative for dysuria.  Musculoskeletal: Negative.  Negative for back pain.  Skin: Negative.  Negative for rash.  Neurological: Negative.  Negative for dizziness, focal weakness, weakness and headaches.  Psychiatric/Behavioral: Negative.  The patient is not nervous/anxious.     As per HPI. Otherwise, a complete review of systems is negative.  PAST MEDICAL HISTORY: Past Medical History:  Diagnosis Date   Abdominal wall mass of right lower quadrant    Abnormal finding on EKG    Anemia    patient unaware of this diagnosis   Atherosclerosis of lower extremity with claudication ([place])    Cigarette smoker    Hearing loss    Hyperlipidemia    Leg DVT (deep venous thromboembolism), acute, left (HCC)     Peripheral vascular disease (HCC)    blockages in both extremities   Vitamin D deficiency    patient unaware of this diagnosis   Wears dentures    Full upper, Partial lower    PAST SURGICAL HISTORY: Past Surgical History:  Procedure Laterality Date   ANGIOPLASTY ILLIAC ARTERY Left 10/03/2017   Procedure: ANGIOPLASTY ILIAC ARTERY;  Surgeon: Algernon Huxley, MD;  Location: [facility];  Service: Vascular;  Laterality: Left;   BACK SURGERY  1990   Three sx in lower back. metal in lower back   CATARACT EXTRACTION W/PHACO Left 08/17/2021   Procedure: CATARACT EXTRACTION PHACO AND INTRAOCULAR LENS PLACEMENT (IOC) LEFT 3.48 00:30.9;  Surgeon: Eulogio Bear, MD;  Location: [facility];  Service: Ophthalmology;  Laterality: Left;   CATARACT EXTRACTION W/PHACO Right 09/12/2021   Procedure: CATARACT EXTRACTION PHACO AND INTRAOCULAR LENS PLACEMENT (IOC) RIGHT 5.74 00:42.3;  Surgeon: Eulogio Bear, MD;  Location: [facility];  Service: Ophthalmology;  Laterality: Right;   COLONOSCOPY  01/2010   ENDARTERECTOMY FEMORAL Left 10/03/2017   Procedure: ENDARTERECTOMY FEMORAL;  Surgeon: Algernon Huxley, MD;  Location: [facility];  Service: Vascular;  Laterality: Left;   Left Leg stent Left 2009   fem fem bypass   LOWER EXTREMITY ANGIOGRAPHY Left 09/19/2017   Procedure: LOWER EXTREMITY ANGIOGRAPHY;  Surgeon: Algernon Huxley, MD;  Location: [facility];  Service: Cardiovascular;  Laterality: Left;   LOWER EXTREMITY ANGIOGRAPHY Right 01/07/2018   Procedure: LOWER EXTREMITY ANGIOGRAPHY;  Surgeon: Algernon Huxley, MD;  Location: [facility];  Service: Cardiovascular;  Laterality: Right;   PTCA Right 08/2017   VASCULAR SURGERY Left 2009   fem fem bypass     FAMILY HISTORY: Family History  Problem Relation Age of Onset   Colon cancer Neg Hx     ADVANCED DIRECTIVES (Y/N):  N  HEALTH MAINTENANCE: Social History   Tobacco Use   Smoking status: Every Day    Packs/day: 0.50     Years: 40.00    Total pack years: 20.00    Types: Cigarettes   Smokeless tobacco: Never   Tobacco comments:       Vaping Use   Vaping Use: Never used  Substance Use Topics   Alcohol use: Never   Drug use: Never     Colonoscopy:  PAP:  Bone density:  Lipid panel:  No Known Allergies  No current outpatient medications on file.   No current facility-administered medications for this visit.    OBJECTIVE: Vitals:   05/08/22 1022  BP: 134/74  Pulse: (!) 55  Resp: 16  Temp: (!) 96.5 F (35.8 C)  SpO2: 100%     Body mass index is 21.14 kg/m.    ECOG FS:0 - Asymptomatic  General: Well-developed, well-nourished, no acute distress. Eyes: Pink conjunctiva, anicteric sclera. HEENT: Normocephalic, moist mucous membranes. Lungs: No audible wheezing or coughing. Heart: Regular rate and rhythm. Abdomen: Soft, nontender, no obvious distention. Musculoskeletal: No edema, cyanosis, or clubbing. Neuro: Alert, answering all questions appropriately. Cranial nerves grossly intact. Skin: No rashes or petechiae noted. Psych: Normal affect.  LAB RESULTS:  Lab Results  Component Value Date   NA 136 04/17/2022   K 4.1 04/17/2022   CL 102 04/17/2022   CO2 24 04/17/2022   GLUCOSE 94 04/17/2022   BUN 12 04/17/2022   CREATININE 1.19 04/17/2022   CALCIUM 9.0 04/17/2022   PROT 7.8 04/17/2022   ALBUMIN 4.5 04/17/2022   AST 19 04/17/2022   ALT 9 04/17/2022   ALKPHOS 46 04/17/2022   BILITOT 1.1 04/17/2022   GFRNONAA >60 04/17/2022   GFRAA >60 01/04/2018    Lab Results  Component Value Date   WBC 5.1 04/17/2022   NEUTROABS 2.8 04/17/2022   HGB 14.9 04/17/2022   HCT 42.9 04/17/2022   MCV 90.1 04/17/2022   PLT 191 04/17/2022     STUDIES: CT CHEST ABDOMEN PELVIS W CONTRAST  Result Date: 04/24/2022 CLINICAL DATA:  Prostate cancer * Tracking Code: BO * EXAM: CT CHEST, ABDOMEN, AND PELVIS WITH CONTRAST TECHNIQUE: Multidetector CT imaging of the chest, abdomen and pelvis was  performed following the standard protocol during bolus administration of intravenous contrast. RADIATION DOSE REDUCTION: This exam was performed according to the departmental dose-optimization program which includes automated exposure control, adjustment of the mA and/or kV according to patient size and/or use of iterative reconstruction technique. CONTRAST:  72m OMNIPAQUE IOHEXOL 300 MG/ML  SOLN COMPARISON:  PET-CT 09/19/2021. Old CT scan without contrast 07/18/2011 FINDINGS: CT CHEST FINDINGS Cardiovascular: Heart is nonenlarged. No pericardial effusion. Normal caliber thoracic aorta with some scattered partially calcified atherosclerotic plaque. Coronary artery calcifications are seen. 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 There is significant plaque distally on this branch with a focal moderate stenosis approaching close to 60%. Please see series 4, image 65. Please correlate with any symptoms. Also stenosis seen at the left subclavian artery origin. Mediastinum/Nodes: No specific abnormal lymph node enlargement identified in the axillary region, hilum or mediastinum. Slightly patulous thoracic esophagus. Lungs/Pleura: No consolidation, pneumothorax or effusion. Scattered emphysematous changes  are seen including paraseptal and centrilobular. There peripheral areas of interstitial septal thickening bilaterally with areas of scarring and fibrosis. Small nodule along the inferior left costophrenic angle on series 3, image 117 was not seen previously and measures 4 mm. Musculoskeletal: Mild degenerative changes seen along the spine. There are some old healed right lateral rib fractures identified. CT ABDOMEN PELVIS FINDINGS Hepatobiliary: Diffuse fatty liver infiltration. Patent portal vein. Gallbladder is nondilated. No space-occupying liver lesion. Pancreas: Preserved pancreatic parenchyma with some mild ectasia of the pancreatic duct, nonspecific. No mass lesion. Spleen: Spleen is nonenlarged.  Adrenals/Urinary Tract: Right adrenal gland is slightly diffusely thickened, nonspecific. Once again there is a left adrenal nodule measuring 2.3 cm. On previous exams this had density consistent with an adenoma. There is some mild atrophy seen in both kidneys without clear enhancing mass or collecting system dilatation. Right-sided Bosniak 1 renal cysts identified anteriorly with diameter of 11 mm and Hounsfield units of 18. No specific imaging follow-up. The ureters have a normal course and caliber down to the bladder. Relatively collapsed urinary bladder. Stomach/Bowel: On this non oral contrast exam, the large bowel has a normal course and caliber with scattered colonic stool. There is moderate fluid in the stomach. Small bowel is nondilated. Vascular/Lymphatic: Diffuse vascular calcifications are identified. There is in area of endograft in place. The lumens of the graft are patent extend down to the external iliac vessels. There is an endoleak identified. Favor a type 2. Diameter of the aorta in total however is only 2.1 by 2.1 cm. There is a femoral to femoral artery bypass graft which appears occluded. No abnormal lymph node enlargement identified in the abdomen and pelvis. Reproductive: Prostate is unremarkable. Other: No specific abnormal lymph node enlargement present in the abdomen and pelvis. Musculoskeletal: Degenerative changes seen along the spine and pelvis. Deformity of the right iliac bone is likely a donor site for the fixation hardware along the lower lumbar spine at L5-S1. Trace listhesis at L4-5. Multilevel osteophytes and disc bulging. IMPRESSION: No bowel obstruction, free air or free fluid. No developing mass lesion or lymph node enlargement. Emphysematous lung changes. 4 mm lung nodule. Although likely benign, if the patient is high-risk, given the morphology and/or location of this nodule a non-contrast chest CT can be considered in 12 months.This recommendation follows the consensus  statement: Guidelines for Management of Incidental Pulmonary Nodules Detected on CT Images: From the Fleischner Society 2017; Radiology 2017; 284:228-243. Diffuse atherosclerotic changes identified. 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 Separate mild stenosis along the left subclavian artery origin. Please correlate with symptoms. Aortic endograft in place with an endoleak noted. This could be a type 2 leak. The aorta however is not dilated. Dedicated workup when appropriate and correlate with symptoms. Femoral to femoral artery bypass graft appears occluded. Fatty liver infiltration. Electronically Signed   By: Jill Side M.D.   On: 04/24/2022 14:02    ASSESSMENT: Unintentional weight loss, history of stage IIc prostate cancer.  PLAN:    Unintentional weight loss: Patient is a poor historian and difficult to ascertain length of time.  All of patient's laboratory work including complete metabolic panel, CBC, and thyroid panel are all within normal limits.  CT of the chest, abdomen, and pelvis did not reveal any significant pathology to explain his weight loss.  No intervention is needed at this time.  Patient will return to clinic in 1 year with repeat CT scan of the chest to evaluate the  4 mm pulmonary nodule with follow-up 2 to 3 days later.  History of stage IIc prostate cancer: Diagnosed in February 2020, Gleason's 4+3.  Patient underwent IMRT in June 2020.  He also received several injections of Lupron most recently in June 2021.  His most recent PSA on April 03, 2022 was reported 0.2.  Continue follow-up with urology as scheduled. Pulmonary nodule: Likely benign, but will repeat CT scan in 12 months as above.  Patient expressed understanding and was in agreement with this plan. He also understands that He can call clinic at any time with any questions, concerns, or complaints.    Cancer Staging  Prostate cancer [facility]) Staging form: Prostate, AJCC 8th  Edition - Clinical stage from 04/17/2022: Stage IIC (cT2a, cN0, cM0, PSA: 3.7, Grade Group: 3) - Signed by Lloyd Huger, MD on 04/17/2022 Stage prefix: Initial diagnosis Prostate specific antigen (PSA) range: Less than 10 Gleason score: 7 Histologic grading system: 5 grade system   Lloyd Huger, MD   05/08/2022 1:47 PM

## 2022-05-09 NOTE — Progress Notes (Signed)
Did not receive paperwork for Jerry [name] from patient, called to speak with him. Left him a voicemail asking that he return my call.

## 2022-05-10 ENCOUNTER — Encounter: Payer: Self-pay | Admitting: Gastroenterology

## 2022-05-26 ENCOUNTER — Encounter: Payer: Self-pay | Admitting: Emergency Medicine

## 2022-05-26 ENCOUNTER — Other Ambulatory Visit (INDEPENDENT_AMBULATORY_CARE_PROVIDER_SITE_OTHER): Payer: Self-pay | Admitting: Nurse Practitioner

## 2022-05-26 ENCOUNTER — Encounter
Admission: EM | Disposition: A | Discharge status: Home / Self Care | Payer: Self-pay | Source: Home / Self Care | Attending: Vascular Surgery

## 2022-05-26 ENCOUNTER — Other Ambulatory Visit: Payer: Self-pay

## 2022-05-26 ENCOUNTER — Inpatient Hospital Stay
Admission: EM | Admit: 2022-05-26 | Discharge: 2022-06-01 | DRG: 271 | Disposition: A | Discharge status: Home / Self Care | Payer: Medicare PPO | Attending: Vascular Surgery | Admitting: Vascular Surgery

## 2022-05-26 ENCOUNTER — Encounter (INDEPENDENT_AMBULATORY_CARE_PROVIDER_SITE_OTHER): Payer: Self-pay | Admitting: Nurse Practitioner

## 2022-05-26 ENCOUNTER — Ambulatory Visit (INDEPENDENT_AMBULATORY_CARE_PROVIDER_SITE_OTHER): Payer: Medicare PPO

## 2022-05-26 ENCOUNTER — Ambulatory Visit (INDEPENDENT_AMBULATORY_CARE_PROVIDER_SITE_OTHER): Payer: Medicare PPO | Admitting: Nurse Practitioner

## 2022-05-26 VITALS — BP 124/69 | HR 56 | Resp 14 | Ht 65.0 in | Wt 124.0 lb

## 2022-05-26 DIAGNOSIS — Z9841 Cataract extraction status, right eye: Secondary | ICD-10-CM | POA: Diagnosis not present

## 2022-05-26 DIAGNOSIS — I70621 Atherosclerosis of nonbiological bypass graft(s) of the extremities with rest pain, right leg: Secondary | ICD-10-CM | POA: Diagnosis present

## 2022-05-26 DIAGNOSIS — Z86718 Personal history of other venous thrombosis and embolism: Secondary | ICD-10-CM | POA: Diagnosis not present

## 2022-05-26 DIAGNOSIS — E785 Hyperlipidemia, unspecified: Secondary | ICD-10-CM

## 2022-05-26 DIAGNOSIS — Z95828 Presence of other vascular implants and grafts: Secondary | ICD-10-CM | POA: Diagnosis not present

## 2022-05-26 DIAGNOSIS — M79604 Pain in right leg: Secondary | ICD-10-CM

## 2022-05-26 DIAGNOSIS — I998 Other disorder of circulatory system: Principal | ICD-10-CM

## 2022-05-26 DIAGNOSIS — Z9889 Other specified postprocedural states: Secondary | ICD-10-CM | POA: Diagnosis not present

## 2022-05-26 DIAGNOSIS — I70221 Atherosclerosis of native arteries of extremities with rest pain, right leg: Secondary | ICD-10-CM | POA: Diagnosis present

## 2022-05-26 DIAGNOSIS — Y838 Other surgical procedures as the cause of abnormal reaction of the patient, or of later complication, without mention of misadventure at the time of the procedure: Secondary | ICD-10-CM | POA: Diagnosis present

## 2022-05-26 DIAGNOSIS — F17210 Nicotine dependence, cigarettes, uncomplicated: Secondary | ICD-10-CM | POA: Diagnosis present

## 2022-05-26 DIAGNOSIS — H9190 Unspecified hearing loss, unspecified ear: Secondary | ICD-10-CM | POA: Diagnosis present

## 2022-05-26 DIAGNOSIS — Z961 Presence of intraocular lens: Secondary | ICD-10-CM | POA: Diagnosis present

## 2022-05-26 DIAGNOSIS — R410 Disorientation, unspecified: Secondary | ICD-10-CM | POA: Diagnosis not present

## 2022-05-26 DIAGNOSIS — Z9842 Cataract extraction status, left eye: Secondary | ICD-10-CM

## 2022-05-26 DIAGNOSIS — F17200 Nicotine dependence, unspecified, uncomplicated: Secondary | ICD-10-CM | POA: Diagnosis not present

## 2022-05-26 DIAGNOSIS — Z952 Presence of prosthetic heart valve: Secondary | ICD-10-CM

## 2022-05-26 DIAGNOSIS — I70229 Atherosclerosis of native arteries of extremities with rest pain, unspecified extremity: Secondary | ICD-10-CM | POA: Diagnosis not present

## 2022-05-26 DIAGNOSIS — I739 Peripheral vascular disease, unspecified: Secondary | ICD-10-CM | POA: Diagnosis not present

## 2022-05-26 DIAGNOSIS — R451 Restlessness and agitation: Secondary | ICD-10-CM | POA: Diagnosis not present

## 2022-05-26 DIAGNOSIS — Z91199 Patient's noncompliance with other medical treatment and regimen due to unspecified reason: Secondary | ICD-10-CM | POA: Diagnosis not present

## 2022-05-26 DIAGNOSIS — T82856A Stenosis of peripheral vascular stent, initial encounter: Secondary | ICD-10-CM | POA: Diagnosis present

## 2022-05-26 DIAGNOSIS — I743 Embolism and thrombosis of arteries of the lower extremities: Secondary | ICD-10-CM

## 2022-05-26 HISTORY — PX: LOWER EXTREMITY ANGIOGRAPHY: CATH118251

## 2022-05-26 LAB — CBC
HCT: 43.2 % (ref 39.0–52.0)
Hemoglobin: 14.5 g/dL (ref 13.0–17.0)
MCH: 31.4 pg (ref 26.0–34.0)
MCHC: 33.6 g/dL (ref 30.0–36.0)
MCV: 93.5 fL (ref 80.0–100.0)
Platelets: 200 10*3/uL (ref 150–400)
RBC: 4.62 MIL/uL (ref 4.22–5.81)
RDW: 13.3 % (ref 11.5–15.5)
WBC: 7.7 10*3/uL (ref 4.0–10.5)
nRBC: 0 % (ref 0.0–0.2)

## 2022-05-26 LAB — BASIC METABOLIC PANEL
Anion gap: 10 (ref 5–15)
BUN: 17 mg/dL (ref 8–23)
CO2: 24 mmol/L (ref 22–32)
Calcium: 9.4 mg/dL (ref 8.9–10.3)
Chloride: 103 mmol/L (ref 98–111)
Creatinine, Ser: 1.25 mg/dL — ABNORMAL HIGH (ref 0.61–1.24)
GFR, Estimated: 59 mL/min — ABNORMAL LOW (ref >60)
Glucose, Bld: 96 mg/dL (ref 70–99)
Potassium: 4 mmol/L (ref 3.5–5.1)
Sodium: 137 mmol/L (ref 135–145)

## 2022-05-26 LAB — APTT: aPTT: 32 seconds (ref 24–36)

## 2022-05-26 LAB — PROTIME-INR
INR: 1.1 (ref 0.8–1.2)
Prothrombin Time: 13.8 seconds (ref 11.4–15.2)

## 2022-05-26 LAB — TYPE AND SCREEN
ABO/RH(D): O POS
Antibody Screen: NEGATIVE

## 2022-05-26 LAB — GLUCOSE, CAPILLARY: Glucose-Capillary: 112 mg/dL — ABNORMAL HIGH (ref 70–99)

## 2022-05-26 SURGERY — LOWER EXTREMITY ANGIOGRAPHY
Anesthesia: Moderate Sedation | Laterality: Right

## 2022-05-26 MED ORDER — HEPARIN (PORCINE) 25000 UT/250ML-% IV SOLN
900.0000 [IU]/h | INTRAVENOUS | Status: DC
  Administered 2022-05-26: 900 [IU]/h via INTRAVENOUS
  Filled 2022-05-26: qty 250

## 2022-05-26 MED ORDER — MIDAZOLAM HCL 2 MG/ML PO SYRP: 8.0000 mg | ORAL_SOLUTION | Freq: Once | ORAL | Status: DC | PRN

## 2022-05-26 MED ORDER — TIROFIBAN (AGGRASTAT) BOLUS VIA INFUSION
25.0000 ug/kg | Freq: Once | INTRAVENOUS | Status: AC
  Filled 2022-05-26: qty 29

## 2022-05-26 MED ORDER — LABETALOL HCL 5 MG/ML IV SOLN: 10.0000 mg | INTRAVENOUS | Status: DC | PRN

## 2022-05-26 MED ORDER — CEFAZOLIN SODIUM-DEXTROSE 2-4 GM/100ML-% IV SOLN
INTRAVENOUS | Status: AC
  Administered 2022-05-26: 2 g via INTRAVENOUS
  Filled 2022-05-26: qty 100

## 2022-05-26 MED ORDER — FENTANYL CITRATE (PF) 100 MCG/2ML IJ SOLN
INTRAMUSCULAR | Status: DC | PRN
  Administered 2022-05-26: 25 ug via INTRAVENOUS
  Administered 2022-05-26: 50 ug via INTRAVENOUS
  Administered 2022-05-26: 25 ug via INTRAVENOUS

## 2022-05-26 MED ORDER — SODIUM CHLORIDE 0.9% FLUSH
3.0000 mL | INTRAVENOUS | Status: DC | PRN
  Administered 2022-05-31: 3 mL via INTRAVENOUS

## 2022-05-26 MED ORDER — TIROFIBAN HCL IV 12.5 MG/250 ML
0.1500 ug/kg/min | INTRAVENOUS | Status: DC
  Administered 2022-05-26: 0.15 ug/kg/min via INTRAVENOUS

## 2022-05-26 MED ORDER — HYDRALAZINE HCL 20 MG/ML IJ SOLN: 5.0000 mg | INTRAMUSCULAR | Status: DC | PRN

## 2022-05-26 MED ORDER — ATORVASTATIN CALCIUM 10 MG PO TABS
10.0000 mg | ORAL_TABLET | Freq: Every day | ORAL | Status: DC
  Administered 2022-05-27 – 2022-05-31 (×5): 10 mg via ORAL
  Filled 2022-05-26 (×6): qty 1

## 2022-05-26 MED ORDER — HEPARIN SODIUM (PORCINE) 1000 UNIT/ML IJ SOLN
INTRAMUSCULAR | Status: AC
  Filled 2022-05-26: qty 10

## 2022-05-26 MED ORDER — DIPHENHYDRAMINE HCL 50 MG/ML IJ SOLN: 50.0000 mg | Freq: Once | INTRAMUSCULAR | Status: DC | PRN

## 2022-05-26 MED ORDER — OXYCODONE HCL 5 MG PO TABS
5.0000 mg | ORAL_TABLET | ORAL | Status: DC | PRN
  Administered 2022-05-26: 10 mg via ORAL
  Filled 2022-05-26: qty 1
  Filled 2022-05-26: qty 2

## 2022-05-26 MED ORDER — ACETAMINOPHEN 325 MG PO TABS: 650.0000 mg | ORAL_TABLET | ORAL | Status: DC | PRN

## 2022-05-26 MED ORDER — CEFAZOLIN SODIUM-DEXTROSE 2-4 GM/100ML-% IV SOLN: 2.0000 g | INTRAVENOUS | Status: AC

## 2022-05-26 MED ORDER — DEXMEDETOMIDINE HCL IN NACL 400 MCG/100ML IV SOLN
0.0000 ug/kg/h | INTRAVENOUS | Status: DC
  Administered 2022-05-26: 0.4 ug/kg/h via INTRAVENOUS
  Administered 2022-05-27: 0.3 ug/kg/h via INTRAVENOUS
  Filled 2022-05-26 (×2): qty 100

## 2022-05-26 MED ORDER — TIROFIBAN HCL IV 12.5 MG/250 ML
INTRAVENOUS | Status: AC
  Administered 2022-05-26: 1405 ug via INTRAVENOUS
  Filled 2022-05-26: qty 250

## 2022-05-26 MED ORDER — METHYLPREDNISOLONE SODIUM SUCC 125 MG IJ SOLR: 125.0000 mg | Freq: Once | INTRAMUSCULAR | Status: DC | PRN

## 2022-05-26 MED ORDER — CHLORHEXIDINE GLUCONATE CLOTH 2 % EX PADS: 6.0000 | MEDICATED_PAD | Freq: Once | CUTANEOUS | Status: DC

## 2022-05-26 MED ORDER — ONDANSETRON HCL 4 MG/2ML IJ SOLN: 4.0000 mg | Freq: Four times a day (QID) | INTRAMUSCULAR | Status: DC | PRN

## 2022-05-26 MED ORDER — FAMOTIDINE 20 MG PO TABS: 40.0000 mg | ORAL_TABLET | Freq: Once | ORAL | Status: DC | PRN

## 2022-05-26 MED ORDER — HYDRALAZINE HCL 20 MG/ML IJ SOLN
INTRAMUSCULAR | Status: AC
  Administered 2022-05-26: 10 mg via INTRAVENOUS
  Filled 2022-05-26: qty 1

## 2022-05-26 MED ORDER — FENTANYL CITRATE (PF) 100 MCG/2ML IJ SOLN
INTRAMUSCULAR | Status: AC
  Filled 2022-05-26: qty 2

## 2022-05-26 MED ORDER — SODIUM CHLORIDE 0.9 % IV SOLN: INTRAVENOUS | Status: DC

## 2022-05-26 MED ORDER — HYDRALAZINE HCL 20 MG/ML IJ SOLN
10.0000 mg | INTRAMUSCULAR | Status: DC | PRN
  Administered 2022-05-26: 10 mg via INTRAVENOUS

## 2022-05-26 MED ORDER — MIDAZOLAM HCL 2 MG/2ML IJ SOLN
INTRAMUSCULAR | Status: DC | PRN
  Administered 2022-05-26: 2 mg via INTRAVENOUS
  Administered 2022-05-26: 1 mg via INTRAVENOUS

## 2022-05-26 MED ORDER — CEFAZOLIN SODIUM-DEXTROSE 2-4 GM/100ML-% IV SOLN: 2.0000 g | INTRAVENOUS | Status: DC

## 2022-05-26 MED ORDER — HYDROMORPHONE HCL 1 MG/ML IJ SOLN: 1.0000 mg | Freq: Once | INTRAMUSCULAR | Status: DC | PRN

## 2022-05-26 MED ORDER — MORPHINE SULFATE (PF) 4 MG/ML IV SOLN
2.0000 mg | INTRAVENOUS | Status: DC | PRN
  Administered 2022-05-26: 2 mg via INTRAVENOUS
  Filled 2022-05-26: qty 1

## 2022-05-26 MED ORDER — HEPARIN SODIUM (PORCINE) 1000 UNIT/ML IJ SOLN
INTRAMUSCULAR | Status: DC | PRN
  Administered 2022-05-26: 6000 [IU] via INTRAVENOUS

## 2022-05-26 MED ORDER — HYDROMORPHONE HCL 1 MG/ML IJ SOLN: 0.5000 mg | Freq: Once | INTRAMUSCULAR | Status: AC

## 2022-05-26 MED ORDER — MIDAZOLAM HCL 2 MG/2ML IJ SOLN
INTRAMUSCULAR | Status: AC
  Filled 2022-05-26: qty 4

## 2022-05-26 MED ORDER — SODIUM CHLORIDE 0.9% FLUSH
3.0000 mL | Freq: Two times a day (BID) | INTRAVENOUS | Status: DC
  Administered 2022-05-27 – 2022-05-31 (×9): 3 mL via INTRAVENOUS

## 2022-05-26 MED ORDER — SODIUM CHLORIDE 0.9 % IV SOLN: 250.0000 mL | INTRAVENOUS | Status: DC | PRN

## 2022-05-26 MED ORDER — HEPARIN BOLUS VIA INFUSION
3300.0000 [IU] | Freq: Once | INTRAVENOUS | Status: AC
  Administered 2022-05-26: 3300 [IU] via INTRAVENOUS
  Filled 2022-05-26: qty 3300

## 2022-05-26 MED ORDER — FENTANYL CITRATE (PF) 100 MCG/2ML IJ SOLN: 12.5000 ug | Freq: Once | INTRAMUSCULAR | Status: DC | PRN

## 2022-05-26 MED ORDER — HYDROMORPHONE HCL 1 MG/ML IJ SOLN
INTRAMUSCULAR | Status: AC
  Administered 2022-05-26: 0.5 mg via INTRAVENOUS
  Filled 2022-05-26: qty 0.5

## 2022-05-26 SURGICAL SUPPLY — 21 items
BALLN LUTONIX DCB 7X60X130 (BALLOONS) ×1
BALLN ULTRVRSE 8X60X75C (BALLOONS) ×1
BALLOON LUTONIX DCB 7X60X130 (BALLOONS) IMPLANT
BALLOON ULTRVRSE 8X60X75C (BALLOONS) IMPLANT
CANISTER PENUMBRA ENGINE (MISCELLANEOUS) IMPLANT
CANNULA 5F STIFF (CANNULA) IMPLANT
CATH ANGIO 5F PIGTAIL 65CM (CATHETERS) IMPLANT
CATH INDIGO CAT6 KIT (CATHETERS) IMPLANT
DEVICE STARCLOSE SE CLOSURE (Vascular Products) IMPLANT
GLIDEWIRE ADV .035X180CM (WIRE) IMPLANT
GOWN STRL REUS W/ TWL LRG LVL3 (GOWN DISPOSABLE) ×1 IMPLANT
GOWN STRL REUS W/TWL LRG LVL3 (GOWN DISPOSABLE) ×1
KIT ENCORE 26 ADVANTAGE (KITS) IMPLANT
KIT MICROPUNCTURE NIT STIFF (SHEATH) IMPLANT
PACK ANGIOGRAPHY (CUSTOM PROCEDURE TRAY) ×1 IMPLANT
SHEATH BRITE TIP 5FRX11 (SHEATH) IMPLANT
SHEATH BRITE TIP 6FRX11 (SHEATH) IMPLANT
SYR MEDRAD MARK 7 150ML (SYRINGE) IMPLANT
TUBING CONTRAST HIGH PRESS 72 (TUBING) IMPLANT
WIRE GUIDERIGHT .035X150 (WIRE) IMPLANT
WIRE SUPRACORE 300CM (WIRE) IMPLANT

## 2022-05-26 NOTE — ED Provider Notes (Signed)
[facility] Provider Note    Event Date/Time   First MD Initiated Contact with Patient 05/26/22 1430     (approximate)   History   Leg Numbness   HPI  Jerry [name] is a 77 y.o. male patient reports he had previous surgeries with Dr. Laurence [name].  Since Sunday of last week he has been experiencing some pain and feels like his right leg is a little cold  He went to vascular surgery clinic today and they sent him to the emergency department.  Right now he reports that he is not in a lot of pain and it was worse a few days ago.  Discomfort in his right lower leg.  He has no other concerns other than he reports he is "getting old"   Reviewed clinic note today and it notes "1. 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 (HCC) Today noninvasive studies show an occluded femorofemoral bypass graft.  No flow detected in the distal AT, PT or peroneal arteries by duplex.  Due to the critical nature of the patient's lower extremity symptoms as well as how long they have been ongoing, I recommended that the patient present to the emergency room for evaluation and treatment.  I discussed with patient that he will need to undergo an angiogram of his lower extremity in order to achieve reperfusion.  The patient has undergone this before."  Physical Exam   Triage Vital Signs: ED Triage Vitals  Enc Vitals Group     BP 05/26/22 1423 (!) 150/74     Pulse Rate 05/26/22 1423 69     Resp 05/26/22 1423 18     Temp 05/26/22 1423 97.9 F (36.6 C)     Temp Source 05/26/22 1423 Oral     SpO2 05/26/22 1423 100 %     Weight --      Height --      Head Circumference --      Peak Flow --      Pain Score 05/26/22 1424 10     Pain Loc --      Pain Edu? --      Excl. in [place]? --     Most recent vital signs: Vitals:   05/26/22 1423  BP: (!) 150/74  Pulse: 69  Resp: 18  Temp: 97.9 F (36.6 C)  SpO2: 100%     General: Awake, no distress.  CV:  Good  peripheral perfusion in all extremities except slightly cool to touch right foot when compared to the left.  Resp:  Normal effort.  Abd:  No distention.  Other:  No palpable pulses noted in the right foot/ankle.  Slightly cool to touch of the toes but not obviously purpuric.   ED Results / Procedures / Treatments   Labs (all labs ordered are listed, but only abnormal results are displayed) Labs Reviewed  CBC  BASIC METABOLIC PANEL  PROTIME-INR     EKG     RADIOLOGY     PROCEDURES:  Critical Care performed: Yes, see critical care procedure note(s)  Procedures   MEDICATIONS ORDERED IN ED: Medications - No data to display   IMPRESSION / MDM / [place] / ED COURSE  I reviewed the triage vital signs and the nursing notes.                              Differential diagnosis includes, but  is not limited to, critical limb ischemia.  Patient has no detectable palpable pulses in the right lower extremity and per clinic note from vascular surgery has critical limb ischemia referred to the emergency department.  I sent notification to Jerry [name] and Dr. Delana [name] at 2:35 PM requesting consult and notifying them of patient arrival.  Currently alert well-oriented without distress.  Appears to be somewhat chronic and that symptoms started roughly Sunday of a week ago.  Await recommendations from vascular surgery but based on their note it seems the plan is to proceed likely with an angiogram per their team.  CRITICAL CARE Performed by: Jerry [name]   Total critical care time: 25 minutes  Critical care time was exclusive of separately billable procedures and treating other patients.  Critical care was necessary to treat or prevent imminent or life-threatening deterioration.  Critical care was time spent personally by me on the following activities: development of treatment plan with patient and/or surrogate as well as nursing, discussions with consultants, evaluation  of patient's response to treatment, examination of patient, obtaining history from patient or surrogate, ordering and performing treatments and interventions, ordering and review of laboratory studies, ordering and review of radiographic studies, pulse oximetry and re-evaluation of patient's condition.   Patient's presentation is most consistent with acute presentation with potential threat to life or bodily function.    Labs interpreted as normal CBC and metabolic panel  Patient reports he takes no prescription medications.  Denies any history of bleeding disorder or bleeding problems.  Discussed with our vascular surgery team who knows the patient, they recommend he be started on heparin which I have ordered.  Patient is understanding agreeable with need for hospitalization and pending vascular surgery consultation    Consulted with and suggests consultation discussed via OR staff with Dr. Ronalee [name]  ----------------------------------------- 3:21 PM on 05/26/2022 ----------------------------------------- Patient be admitted to the service of Dr. Delana [name] under vascular surgery.  FINAL CLINICAL IMPRESSION(S) / ED DIAGNOSES   Final diagnoses:  Acute lower limb ischemia     Rx / DC Orders   ED Discharge Orders     None        Note:  This document was prepared using Dragon voice recognition software and may include unintentional dictation errors.   Jerry Kitten, MD 05/26/22 [phone]

## 2022-05-26 NOTE — H&P (View-Only) (Signed)
Subjective:    Patient ID: Jerry [name], male    DOB: March 20, 1946, 77 y.o.   MRN: [id number] Chief Complaint  Patient presents with   Follow-up    ultrasound    Jerry [name] is a 77 year old male who presents today as an urgent referral from his primary care provider [name] in regards to severe pain that is been going on his right lower extremity for the last week.  He notes that the pain started on Monday and has since progressed.  He notes that he has extreme issues with claudication-like pain.  He notes that he has severe rest pain like symptoms.  He has not been able to sleep since this began on Monday.  Currently there are no open wounds or ulcerations but the patient's foot is somewhat dusky and cool to the touch.  Today noninvasive studies show an absent ABI on the right and ABI of 1.02 on the left.  The patient has a noted occluded femorofemoral bypass graft.  No flow has been detected in the distal AT PT or peroneal arteries via duplex.  The patient has had a history of multiple revascularizations with the most recent in 2019.    Review of Systems  Cardiovascular:        Claudication rest pain  Musculoskeletal:  Positive for gait problem.  All other systems reviewed and are negative.      Objective:   Physical Exam Vitals reviewed.  HENT:     Head: Normocephalic.  Cardiovascular:     Rate and Rhythm: Normal rate.     Pulses:          Dorsalis pedis pulses are 0 on the right side.       Posterior tibial pulses are 0 on the right side.  Pulmonary:     Effort: Pulmonary effort is normal.  Skin:    General: Skin is cool and dry.     Coloration: Skin is ashen.  Neurological:     Mental Status: He is alert and oriented to person, place, and time.  Psychiatric:        Mood and Affect: Mood normal.        Behavior: Behavior normal.        Thought Content: Thought content normal.        Judgment: Judgment normal.     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)    Cigarette smoker    Hearing loss    Hyperlipidemia    Leg DVT (deep venous thromboembolism), acute, left (HCC)    Peripheral vascular disease (HCC)    blockages in both extremities   Vitamin D deficiency    patient unaware of this diagnosis   Wears dentures    Full upper, Partial lower    Social History   Socioeconomic History   Marital status: Married    Spouse name: Not on file   Number of children: 6   Years of education: Not on file   Highest education level: Not on file  Occupational History   Not on file  Tobacco Use   Smoking status: Every Day    Packs/day: 0.50    Years: 40.00    Total pack years: 20.00    Types: Cigarettes   Smokeless tobacco: Never   Tobacco comments:       Vaping Use   Vaping Use: Never used  Substance and Sexual Activity   Alcohol use: Never   Drug use: Never   Sexual activity: Not Currently  Other Topics Concern   Not on file  Social History Narrative   Not on file   Social Determinants of Health   Financial Resource Strain: Medium Risk (04/17/2022)   Overall Financial Resource Strain (CARDIA)    Difficulty of Paying Living Expenses: Somewhat hard  Food Insecurity: Food Insecurity Present (04/17/2022)   Hunger Vital Sign    Worried About Running Out of Food in the Last Year: Sometimes true    Ran Out of Food in the Last Year: Sometimes true  Transportation Needs: No Transportation Needs (04/17/2022)   PRAPARE - Hydrologist (Medical): No    Lack of Transportation (Non-Medical): No  Physical Activity: Not on file  Stress: Not on file  Social Connections: Not on file  Intimate Partner Violence: Not At Risk  (04/17/2022)   Humiliation, Afraid, Rape, and Kick questionnaire    Fear of Current or Ex-Partner: No    Emotionally Abused: No    Physically Abused: No    Sexually Abused: No    Past Surgical History:  Procedure Laterality Date   ANGIOPLASTY ILLIAC ARTERY Left 10/03/2017   Procedure: ANGIOPLASTY ILIAC ARTERY;  Surgeon: Algernon Huxley, MD;  Location: [facility];  Service: Vascular;  Laterality: Left;   BACK SURGERY  1990   Three sx in lower back. metal in lower back   CATARACT EXTRACTION W/PHACO Left 08/17/2021   Procedure: CATARACT EXTRACTION PHACO AND INTRAOCULAR LENS PLACEMENT (IOC) LEFT 3.48 00:30.9;  Surgeon: Eulogio Bear, MD;  Location: [place];  Service: Ophthalmology;  Laterality: Left;   CATARACT EXTRACTION W/PHACO Right 09/12/2021   Procedure: CATARACT EXTRACTION PHACO AND INTRAOCULAR LENS PLACEMENT (IOC) RIGHT 5.74 00:42.3;  Surgeon: Eulogio Bear, MD;  Location: [place];  Service: Ophthalmology;  Laterality: Right;   COLONOSCOPY  01/2010   ENDARTERECTOMY FEMORAL Left 10/03/2017   Procedure: ENDARTERECTOMY FEMORAL;  Surgeon: Algernon Huxley, MD;  Location: [facility];  Service: Vascular;  Laterality: Left;   Left Leg stent Left 2009   fem fem bypass   LOWER EXTREMITY ANGIOGRAPHY Left 09/19/2017   Procedure: LOWER EXTREMITY ANGIOGRAPHY;  Surgeon: Algernon Huxley, MD;  Location: [place] [facility];  Service: Cardiovascular;  Laterality: Left;   LOWER EXTREMITY ANGIOGRAPHY Right 01/07/2018   Procedure: LOWER EXTREMITY ANGIOGRAPHY;  Surgeon: Algernon Huxley, MD;  Location: [place] [facility];  Service: Cardiovascular;  Laterality: Right;   PTCA Right 08/2017   VASCULAR SURGERY Left 2009   fem fem bypass     Family History  Problem Relation Age of Onset   Colon cancer Neg Hx     Not on File     Latest Ref Rng & Units 04/17/2022    9:52 AM 03/23/2022    9:41 AM 08/15/2018    8:38 AM  CBC  WBC 4.0 - 10.5 K/uL 5.1  7.9  7.2   Hemoglobin 13.0 - 17.0  g/dL 14.9  15.6  14.4   Hematocrit 39.0 - 52.0 % 42.9  46.3  42.9   Platelets 150 -  400 K/uL 191  203  211       CMP     Component Value Date/Time   NA 136 04/17/2022 0952   NA 139 07/18/2011 0934   K 4.1 04/17/2022 0952   K 4.4 07/18/2011 0934   CL 102 04/17/2022 0952   CL 103 07/18/2011 0934   CO2 24 04/17/2022 0952   CO2 25 07/18/2011 0934   GLUCOSE 94 04/17/2022 0952   GLUCOSE 183 (H) 07/18/2011 0934   BUN 12 04/17/2022 0952   BUN 23 (H) 07/18/2011 0934   CREATININE 1.19 04/17/2022 0952   CREATININE 1.47 (H) 07/18/2011 0934   CALCIUM 9.0 04/17/2022 0952   CALCIUM 10.1 07/18/2011 0934   PROT 7.8 04/17/2022 0952   PROT 9.1 (H) 07/18/2011 0934   ALBUMIN 4.5 04/17/2022 0952   ALBUMIN 5.1 (H) 07/18/2011 0934   AST 19 04/17/2022 0952   AST 27 07/18/2011 0934   ALT 9 04/17/2022 0952   ALT 19 07/18/2011 0934   ALKPHOS 46 04/17/2022 0952   ALKPHOS 66 07/18/2011 0934   BILITOT 1.1 04/17/2022 0952   BILITOT 1.2 (H) 07/18/2011 0934   GFRNONAA >60 04/17/2022 0952   GFRNONAA 49 (L) 07/18/2011 0934   GFRAA >60 01/04/2018 1021   GFRAA 56 (L) 07/18/2011 0934     No results found.     Assessment & Plan:   1. 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 (HCC) Today noninvasive studies show an occluded femorofemoral bypass graft.  No flow detected in the distal AT, PT or peroneal arteries by duplex.  Due to the critical nature of the patient's lower extremity symptoms as well as how long they have been ongoing, I recommended that the patient present to the emergency room for evaluation and treatment.  I discussed with patient that he will need to undergo an angiogram of his lower extremity in order to achieve reperfusion.  The patient has undergone this before.  Following risk, benefits and alternative discussions.  The patient understands and is agreeable to proceed.  2. Hyperlipidemia, unspecified hyperlipidemia type Continue statin as  ordered and reviewed, no changes at this time  3. Tobacco use disorder Smoking cessation was discussed, 3-10 minutes spent on this topic specifically   No current outpatient medications on file prior to visit.   No current facility-administered medications on file prior to visit.    There are no Patient Instructions on file for this visit. No follow-ups on file.   Kris Hartmann, NP

## 2022-05-26 NOTE — Progress Notes (Signed)
ANTICOAGULATION CONSULT NOTE - Initial Consult  Pharmacy Consult for IV heparin Indication:  limb ischemia  No Known Allergies  Patient Measurements:   Heparin Dosing Weight: 56.2 kg  Vital Signs: Temp: 98.2 F (36.8 C) (03/01 2130) Temp Source: Oral (03/01 2130) BP: 127/63 (03/01 2130) Pulse Rate: 77 (03/01 2130)  Labs: Recent Labs    05/26/22 1445  HGB 14.5  HCT 43.2  PLT 200  APTT 32  LABPROT 13.8  INR 1.1  CREATININE 1.25*     Estimated Creatinine Clearance: 38.7 mL/min (A) (by C-G formula based on SCr of 1.25 mg/dL (H)).  Medical History: Past Medical History:  Diagnosis Date   Abdominal wall mass of right lower quadrant    Abnormal finding on EKG    Anemia    patient unaware of this diagnosis   Atherosclerosis of lower extremity with claudication ([place])    Cigarette smoker    Hearing loss    Hyperlipidemia    Leg DVT (deep venous thromboembolism), acute, left (HCC)    Peripheral vascular disease (HCC)    blockages in both extremities   Vitamin D deficiency    patient unaware of this diagnosis   Wears dentures    Full upper, Partial lower   Medications:  No PTA anticoagulation per my chart review  Assessment: 77 year old male presenting with right leg numbness being admitted with critical limb ischemia of right lower extremity with nonbiologyical bypass graft and plans for angiogram.  H&H stable. INR 1.1, aPTT 32.  S/p mechanical thrombectomy of the right external iliac artery and right common iliac artery   Goal of Therapy:  Heparin level 0.3-0.7 units/ml Monitor platelets by anticoagulation protocol: Yes   Plan:  Tirofiban '@0'$ .15 mcg/kg/min until 05/27/22 '@0700'$ , then follow up converting back to IV heparin.  Wynelle Cleveland 05/26/2022,10:01 PM

## 2022-05-26 NOTE — Progress Notes (Signed)
Jerry [name] is a 77 year old male who presents today as an urgent referral from his primary care provider [name] in regards to severe pain that is been going on his right lower extremity for the last week. He notes that the pain started on Monday and has since progressed. He notes that he has extreme issues with claudication-like pain. He notes that he has severe rest pain like symptoms. He has not been able to sleep since this began on Monday. Currently there are no open wounds or ulcerations but the patient's foot is somewhat dusky and cool to the touch. Today noninvasive studies show an absent ABI on the right and ABI of 1.02 on the left. The patient has a noted occluded femorofemoral bypass graft. No flow has been detected in the distal AT PT or peroneal arteries via duplex. The patient has had a history of multiple revascularizations with the most recent  by Dr [name] in 2019.   Patient was sent directly to the emergency department after the office visit with vascular surgery earlier today.  Upon arrival [name] was notified.  I then went to speak to the patient.  We discussed the procedure, benefits, risks, and complications.  Patient was noted to have general anesthesia with his last angiogram in specials.  Patient was made aware that this would not happen today and that the patient if agrees to have a procedure would be taken care of with moderate sedation.  Patient verbalizes understanding.  All the patient's questions were answered.  Patient would like to proceed as soon as possible.  Patient's last meal was 6 AM this morning and he will be kept NPO.  Patient has agreed to undergo right lower extremity angiogram with possible intervention with lysis catheter placement for tPA overnight and placement of triple-lumen catheter.  Patient will be taken to ICU for observation and close monitoring overnight.  Patient will need to return to the vascular lab tomorrow 05/27/22 for completion of  the procedure with removal of the lysis catheter and tPA. As stated above. This procedure and process was explained in detail with the patient and he agrees to proceed.   Patient had a CBC, BMP, PTT, PT drawn in the emergency department.  All of those labs have been reviewed.  I ordered a type and cross to be done prior to the procedure knowing he will be receiving tPA therapy.  Plan was discussed with [name] and he is in agreement with the plan.

## 2022-05-26 NOTE — Interval H&P Note (Signed)
History and Physical Interval Note:  05/26/2022 4:38 PM  Jerry [name]  has presented today for surgery, with the diagnosis of critical limb ischemia.  The various methods of treatment have been discussed with the patient and family. After consideration of risks, benefits and other options for treatment, the patient has consented to  Procedure(s): Lower Extremity Angiography (Right) as a surgical intervention.  The patient's history has been reviewed, patient examined, no change in status, stable for surgery.  I have reviewed the patient's chart and labs.  Questions were answered to the patient's satisfaction.     Hortencia Pilar

## 2022-05-26 NOTE — ED Notes (Addendum)
Report given to [facility]

## 2022-05-26 NOTE — Progress Notes (Signed)
Subjective:    Patient ID: Jerry [name], male    DOB: [birth year], 77 y.o.   MRN: [id number] Chief Complaint  Patient presents with   Follow-up    ultrasound    Jerry [name] is a 77 year old male who presents today as an urgent referral from his primary care provider [name] in regards to severe pain that is been going on his right lower extremity for the last week.  He notes that the pain started on Monday and has since progressed.  He notes that he has extreme issues with claudication-like pain.  He notes that he has severe rest pain like symptoms.  He has not been able to sleep since this began on Monday.  Currently there are no open wounds or ulcerations but the patient's foot is somewhat dusky and cool to the touch.  Today noninvasive studies show an absent ABI on the right and ABI of 1.02 on the left.  The patient has a noted occluded femorofemoral bypass graft.  No flow has been detected in the distal AT PT or peroneal arteries via duplex.  The patient has had a history of multiple revascularizations with the most recent in 2019.    Review of Systems  Cardiovascular:        Claudication rest pain  Musculoskeletal:  Positive for gait problem.  All other systems reviewed and are negative.      Objective:   Physical Exam Vitals reviewed.  HENT:     Head: Normocephalic.  Cardiovascular:     Rate and Rhythm: Normal rate.     Pulses:          Dorsalis pedis pulses are 0 on the right side.       Posterior tibial pulses are 0 on the right side.  Pulmonary:     Effort: Pulmonary effort is normal.  Skin:    General: Skin is cool and dry.     Coloration: Skin is ashen.  Neurological:     Mental Status: He is alert and oriented to person, place, and time.  Psychiatric:        Mood and Affect: Mood normal.        Behavior: Behavior normal.        Thought Content: Thought content normal.        Judgment: Judgment normal.     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)    Cigarette smoker    Hearing loss    Hyperlipidemia    Leg DVT (deep venous thromboembolism), acute, left (HCC)    Peripheral vascular disease (HCC)    blockages in both extremities   Vitamin D deficiency    patient unaware of this diagnosis   Wears dentures    Full upper, Partial lower    Social History   Socioeconomic History   Marital status: Married    Spouse name: Not on file   Number of children: 6   Years of education: Not on file   Highest education level: Not on file  Occupational History   Not on file  Tobacco Use   Smoking status: Every Day    Packs/day: 0.50    Years: 40.00    Total pack years: 20.00    Types: Cigarettes   Smokeless tobacco: Never   Tobacco comments:       Vaping Use   Vaping Use: Never used  Substance and Sexual Activity   Alcohol use: Never   Drug use: Never   Sexual activity: Not Currently  Other Topics Concern   Not on file  Social History Narrative   Not on file   Social Determinants of Health   Financial Resource Strain: Medium Risk (04/17/2022)   Overall Financial Resource Strain (CARDIA)    Difficulty of Paying Living Expenses: Somewhat hard  Food Insecurity: Food Insecurity Present (04/17/2022)   Hunger Vital Sign    Worried About Running Out of Food in the Last Year: Sometimes true    Ran Out of Food in the Last Year: Sometimes true  Transportation Needs: No Transportation Needs (04/17/2022)   PRAPARE - Hydrologist (Medical): No    Lack of Transportation (Non-Medical): No  Physical Activity: Not on file  Stress: Not on file  Social Connections: Not on file  Intimate Partner Violence: Not At Risk  (04/17/2022)   Humiliation, Afraid, Rape, and Kick questionnaire    Fear of Current or Ex-Partner: No    Emotionally Abused: No    Physically Abused: No    Sexually Abused: No    Past Surgical History:  Procedure Laterality Date   ANGIOPLASTY ILLIAC ARTERY Left 10/03/2017   Procedure: ANGIOPLASTY ILIAC ARTERY;  Surgeon: Algernon Huxley, MD;  Location: [facility];  Service: Vascular;  Laterality: Left;   BACK SURGERY  1990   Three sx in lower back. metal in lower back   CATARACT EXTRACTION W/PHACO Left 08/17/2021   Procedure: CATARACT EXTRACTION PHACO AND INTRAOCULAR LENS PLACEMENT (IOC) LEFT 3.48 00:30.9;  Surgeon: Eulogio Bear, MD;  Location: [place];  Service: Ophthalmology;  Laterality: Left;   CATARACT EXTRACTION W/PHACO Right 09/12/2021   Procedure: CATARACT EXTRACTION PHACO AND INTRAOCULAR LENS PLACEMENT (IOC) RIGHT 5.74 00:42.3;  Surgeon: Eulogio Bear, MD;  Location: [place];  Service: Ophthalmology;  Laterality: Right;   COLONOSCOPY  01/2010   ENDARTERECTOMY FEMORAL Left 10/03/2017   Procedure: ENDARTERECTOMY FEMORAL;  Surgeon: Algernon Huxley, MD;  Location: [facility];  Service: Vascular;  Laterality: Left;   Left Leg stent Left 2009   fem fem bypass   LOWER EXTREMITY ANGIOGRAPHY Left 09/19/2017   Procedure: LOWER EXTREMITY ANGIOGRAPHY;  Surgeon: Algernon Huxley, MD;  Location: [place] [facility];  Service: Cardiovascular;  Laterality: Left;   LOWER EXTREMITY ANGIOGRAPHY Right 01/07/2018   Procedure: LOWER EXTREMITY ANGIOGRAPHY;  Surgeon: Algernon Huxley, MD;  Location: [place] [facility];  Service: Cardiovascular;  Laterality: Right;   PTCA Right 08/2017   VASCULAR SURGERY Left 2009   fem fem bypass     Family History  Problem Relation Age of Onset   Colon cancer Neg Hx     Not on File     Latest Ref Rng & Units 04/17/2022    9:52 AM 03/23/2022    9:41 AM 08/15/2018    8:38 AM  CBC  WBC 4.0 - 10.5 K/uL 5.1  7.9  7.2   Hemoglobin 13.0 - 17.0  g/dL 14.9  15.6  14.4   Hematocrit 39.0 - 52.0 % 42.9  46.3  42.9   Platelets 150 -  400 K/uL 191  203  211       CMP     Component Value Date/Time   NA 136 04/17/2022 0952   NA 139 07/18/2011 0934   K 4.1 04/17/2022 0952   K 4.4 07/18/2011 0934   CL 102 04/17/2022 0952   CL 103 07/18/2011 0934   CO2 24 04/17/2022 0952   CO2 25 07/18/2011 0934   GLUCOSE 94 04/17/2022 0952   GLUCOSE 183 (H) 07/18/2011 0934   BUN 12 04/17/2022 0952   BUN 23 (H) 07/18/2011 0934   CREATININE 1.19 04/17/2022 0952   CREATININE 1.47 (H) 07/18/2011 0934   CALCIUM 9.0 04/17/2022 0952   CALCIUM 10.1 07/18/2011 0934   PROT 7.8 04/17/2022 0952   PROT 9.1 (H) 07/18/2011 0934   ALBUMIN 4.5 04/17/2022 0952   ALBUMIN 5.1 (H) 07/18/2011 0934   AST 19 04/17/2022 0952   AST 27 07/18/2011 0934   ALT 9 04/17/2022 0952   ALT 19 07/18/2011 0934   ALKPHOS 46 04/17/2022 0952   ALKPHOS 66 07/18/2011 0934   BILITOT 1.1 04/17/2022 0952   BILITOT 1.2 (H) 07/18/2011 0934   GFRNONAA >60 04/17/2022 0952   GFRNONAA 49 (L) 07/18/2011 0934   GFRAA >60 01/04/2018 1021   GFRAA 56 (L) 07/18/2011 0934     No results found.     Assessment & Plan:   1. 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 (HCC) Today noninvasive studies show an occluded femorofemoral bypass graft.  No flow detected in the distal AT, PT or peroneal arteries by duplex.  Due to the critical nature of the patient's lower extremity symptoms as well as how long they have been ongoing, I recommended that the patient present to the emergency room for evaluation and treatment.  I discussed with patient that he will need to undergo an angiogram of his lower extremity in order to achieve reperfusion.  The patient has undergone this before.  Following risk, benefits and alternative discussions.  The patient understands and is agreeable to proceed.  2. Hyperlipidemia, unspecified hyperlipidemia type Continue statin as  ordered and reviewed, no changes at this time  3. Tobacco use disorder Smoking cessation was discussed, 3-10 minutes spent on this topic specifically   No current outpatient medications on file prior to visit.   No current facility-administered medications on file prior to visit.    There are no Patient Instructions on file for this visit. No follow-ups on file.   Kris Hartmann, NP

## 2022-05-26 NOTE — ED Triage Notes (Signed)
Patient sent to ED from imaging for right leg numbness. Sent from imaging- blockage noted on scan. Patient states throbbing pain since Monday. 10/10 pain. Extremity cool to touch.

## 2022-05-26 NOTE — Consult Note (Signed)
NAME:  Jerry [name], MRN:  [id number], DOB:  1945/05/10, LOS: 0 ADMISSION DATE:  05/26/2022, CONSULTATION DATE:  05/26/22 REFERRING MD:  Hortencia Pilar, CHIEF COMPLAINT:  Left leg pain    HPI  77 y.o male with significant PMH as below who presented to the ED  from his primary care provider [name] with severe right lower extremity pain x 1 week.  Per his primary care note, patient reported progressive worsening of symptoms since Monday. He described claudication like pain severe at rest. Bedside studies showed an absent ABI on the right and ABI of 1.02 on the left.The patient has a noted occluded femorofemoral bypass graft. No flow has been detected in the distal AT PT or peroneal arteries via duplex. The patient has hx of atherosclerotic occlusive disease and claudication of LLE s/p Femoral artery endarterectomy, stent placement to distal aorta and multiple stent placement to right and left iliac arteries in 2019.   ED Course: Initial vital signs showed HR of 69 beats/minute, BP 150/74 mm Hg, the RR 16 breaths/minute, and the oxygen saturation 100% on RA and a temperature of 97.38F (36.6C).    Pertinent Labs/Diagnostics Findings: Na+/ K+:  Glucose: BUN/Cr.:Calcium:  AST/ALT: WBC: Hgb/Hct: Plts:  PCT: negative <0.10 Lactic acid:  COVID PCR: Negative, Troponin:  BNP:    Patient was taken to OR for right lower extremity angiogram with possible intervention with lysis catheter placement for tPA overnight and placement of triple-lumen catheter.  He was subsequently admitted to ICU post procedure for further workup and treatment. PCCM consulted for management of pain and agitation/delirium.   Past Medical History  Anemia, Cigarette smoker,  Hearing loss,  Hyperlipidemia,  Peripheral vascular disease Vitamin D deficiency. DVT Prostrate Cancer Left Leg stent (Left, 2009); Lower Extremity Angiography (Left, 09/19/2017) Mitral valve replacement (Right, 08/2017).  Significant Hospital  Events   05/26/22:Admitted to ICU with Critical limb ischemia of right lower extremity s/p right common femoral artery recannulation  Consults:  PCCM  Procedures:  05/26/22: Angioplasty and Mechanical thrombectomy of the right external iliac artery and right common iliac artery   Significant Diagnostic Tests:  None  Interim History / Subjective:  -Remains delirious and agitated post op requiring Precedex gtt  Micro Data:  None  Antimicrobials:  None  OBJECTIVE  Blood pressure 133/74, pulse 72, temperature 98.2 F (36.8 C), temperature source Oral, resp. rate 17, SpO2 99 %.        Intake/Output Summary (Last 24 hours) at 05/26/2022 2253 Last data filed at 05/26/2022 2200 Gross per 24 hour  Intake 570.1 ml  Output 25 ml  Net 545.1 ml   There were no vitals filed for this visit.  Physical Examination  GENERAL:  year-old critically ill patient lying in the bed in no acute distress EYES: PEERLA. No scleral icterus. Extraocular muscles intact.  HEENT: Head atraumatic, normocephalic. Oropharynx and nasopharynx clear.  NECK:  No JVD, supple  LUNGS: Normal breath sounds bilaterally.  No use of accessory muscles of respiration.  CARDIOVASCULAR: S1, S2 normal. No murmurs, rubs, or gallops.  ABDOMEN: Soft, NTND EXTREMITIES: Bilateral Cold, painful, pale limbs with absent pulses on the right Motor weakness. Decreased Sensation. Capillary refill is less than 3 seconds in all extremities NEUROLOGIC: The patient is oriented to self only. Mildly agitated and delirious. Cranial nerves are intact.  SKIN: No obvious rash, lesion, or ulcer. Warm to touch Labs/imaging that I havepersonally reviewed  (right click and "Reselect all SmartList Selections" daily)  Labs   CBC: Recent Labs  Lab 05/26/22 1445  WBC 7.7  HGB 14.5  HCT 43.2  MCV 93.5  PLT [id number]    Basic Metabolic Panel: Recent Labs  Lab 05/26/22 1445  NA 137  K 4.0  CL 103  CO2 24  GLUCOSE 96  BUN 17  CREATININE  1.25*  CALCIUM 9.4   GFR: Estimated Creatinine Clearance: 38.7 mL/min (A) (by C-G formula based on SCr of 1.25 mg/dL (H)). Recent Labs  Lab 05/26/22 1445  WBC 7.7    Liver Function Tests: No results for input(s): "AST", "ALT", "ALKPHOS", "BILITOT", "PROT", "ALBUMIN" in the last 168 hours. No results for input(s): "LIPASE", "AMYLASE" in the last 168 hours. No results for input(s): "AMMONIA" in the last 168 hours.  ABG No results found for: "PHART", "PCO2ART", "PO2ART", "HCO3", "TCO2", "ACIDBASEDEF", "O2SAT"   Coagulation Profile: Recent Labs  Lab 05/26/22 1445  INR 1.1    Cardiac Enzymes: No results for input(s): "CKTOTAL", "CKMB", "CKMBINDEX", "TROPONINI" in the last 168 hours.  HbA1C: No results found for: "HGBA1C"  CBG: Recent Labs  Lab 05/26/22 2131  GLUCAP 112*    Review of Systems:   Unable to obtain due to delirium  Past Medical History  He,  has a past medical history of Abdominal wall mass of right lower quadrant, Abnormal finding on EKG, Anemia, Atherosclerosis of lower extremity with claudication ([place]), Cigarette smoker, Hearing loss, Hyperlipidemia, Leg DVT (deep venous thromboembolism), acute, left ([place]), Peripheral vascular disease ([place]), Vitamin D deficiency, and Wears dentures.   Surgical History    Past Surgical History:  Procedure Laterality Date   ANGIOPLASTY ILLIAC ARTERY Left 10/03/2017   Procedure: ANGIOPLASTY ILIAC ARTERY;  Surgeon: Algernon Huxley, MD;  Location: [facility];  Service: Vascular;  Laterality: Left;   BACK SURGERY  1990   Three sx in lower back. metal in lower back   CATARACT EXTRACTION W/PHACO Left 08/17/2021   Procedure: CATARACT EXTRACTION PHACO AND INTRAOCULAR LENS PLACEMENT (IOC) LEFT 3.48 00:30.9;  Surgeon: Eulogio Bear, MD;  Location: [place];  Service: Ophthalmology;  Laterality: Left;   CATARACT EXTRACTION W/PHACO Right 09/12/2021   Procedure: CATARACT EXTRACTION PHACO AND INTRAOCULAR LENS PLACEMENT (IOC)  RIGHT 5.74 00:42.3;  Surgeon: Eulogio Bear, MD;  Location: [place];  Service: Ophthalmology;  Laterality: Right;   COLONOSCOPY  01/2010   ENDARTERECTOMY FEMORAL Left 10/03/2017   Procedure: ENDARTERECTOMY FEMORAL;  Surgeon: Algernon Huxley, MD;  Location: [facility];  Service: Vascular;  Laterality: Left;   Left Leg stent Left 2009   fem fem bypass   LOWER EXTREMITY ANGIOGRAPHY Left 09/19/2017   Procedure: LOWER EXTREMITY ANGIOGRAPHY;  Surgeon: Algernon Huxley, MD;  Location: [place] [facility];  Service: Cardiovascular;  Laterality: Left;   LOWER EXTREMITY ANGIOGRAPHY Right 01/07/2018   Procedure: LOWER EXTREMITY ANGIOGRAPHY;  Surgeon: Algernon Huxley, MD;  Location: [place] [facility];  Service: Cardiovascular;  Laterality: Right;   PTCA Right 08/2017   VASCULAR SURGERY Left 2009   fem fem bypass      Social History   reports that he has been smoking cigarettes. He has a 20.00 pack-year smoking history. He has never used smokeless tobacco. He reports that he does not drink alcohol and does not use drugs.   Family History   His family history is negative for Colon cancer.   Allergies No Known Allergies   Home Medications  Prior to Admission medications   Not on File  Scheduled Meds:  atorvastatin  10 mg Oral q1800   fentaNYL       heparin sodium (porcine)       midazolam       mouth rinse  15 mL Mouth Rinse 4 times per day   sodium chloride flush  3 mL Intravenous Q12H   Continuous Infusions:  sodium chloride     dexmedetomidine (PRECEDEX) IV infusion 0.5 mcg/kg/hr (05/27/22 0200)   tirofiban 0.15 mcg/kg/min (05/27/22 0200)   PRN Meds:.sodium chloride, acetaminophen, fentaNYL, heparin sodium (porcine), hydrALAZINE, labetalol, midazolam, morphine injection, ondansetron (ZOFRAN) IV, mouth rinse, oxyCODONE, sodium chloride flush   Active Hospital Problem list     Assessment & Plan:  #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  of the right foot; occlusion previously placed iliac stents  S/p Angioplasty and mechanical thrombectomy of the right external iliac artery and right common iliac artery  -Monitor UOP  -Monitor for s/sx of bleeding  -Neurovascular checks per protocol -Continue Aggrastat drip per vascular -Plan to return to the vascular lab tomorrow 05/27/22 for completion of the procedure with removal of the lysis catheter and tPA    #Delirium/Agitation  Hx: Current everyday smoker and Hyperlipidemia  -Supplemental O2 or prn Bipap for dyspnea and/or hypoxia  -Continuous telemetry monitoring  -Trend BMP  -Replace electrolytes as indicated  -Start Precedex gtt for agitation -Prn zofran and phenergan for nausea/vomiting -Prn morphine and percocet for pain management  -Will order nicotine patch once pts mentation improves will need smoking cessation counseling   Best practice:  Diet:  NPO Pain/Anxiety/Delirium protocol (if indicated): Yes (RASS goal -1) VAP protocol (if indicated): Not indicated DVT prophylaxis: Systemic AC GI prophylaxis: N/A Glucose control:  SSI Yes Central venous access:  N/A Arterial line:  N/A Foley:  Yes, and it is still needed Mobility:  bed rest  PT consulted: N/A Last date of multidisciplinary goals of care discussion [05/27/22] Code Status:  full code Disposition: ICU   = Goals of Care = Code Status Order: FULL  Primary Emergency Contact: Mcbrearty,Joyce, Home Phone: [phone] Wishes to pursue full aggressive treatment and intervention options, including CPR and intubation, but goals of care will be addressed on going with family if that should become necessary.   Critical care time: 97 minutes        Rufina Falco DNP, CCRN, FNP-C, AGACNP-BC Acute Care & Family Nurse Practitioner [facility] PCCM on call pager [phone]

## 2022-05-26 NOTE — Op Note (Signed)
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  Abdominal aortogram  2.  Introduction catheter into aorta bilateral femoral approach   3.  Mechanical thrombectomy of the right external iliac artery and right common iliac artery  4.  Angioplasty of the right external iliac artery to 8 mm  5.  Angioplasty of the right common iliac artery to 8 mm  6.  Angioplasty of the right common femoral to 7 mm with a Lutonix drug-eluting balloon  7.  StarClose left common femoral manual pressure held on the right common femoral  Anesthesia: Conscious sedation was administered by the interventional radiology RN under my direct supervision. IV Versed plus fentanyl were utilized. Continuous ECG, pulse oximetry and blood pressure was monitored throughout the entire procedure. Conscious sedation was administered for a total of 76 minutes.  Sheath: 5 French sheath retrograde left common femoral artery; 6 French sheath antegrade right superficial femoral artery  Contrast: 60 cc   Fluoroscopy Time: 8.1 minutes  Indications: Patient presented to the office with 7 days of increasing pain of his right lower extremity.  Noninvasive studies as well as physical examination were consistent with severe ischemia of the right lower extremity with apparent occlusion of previously vascular stents.  He was sent emergently to the ER and subsequently brought to the special procedure suite for attempted limb salvage.  Risks and benefits have been reviewed all questions were answered patient agrees to proceed patient is aware that this is a limb threatening situation.  Procedure:  Jerry [name] a 77  y.o. male who was identified and appropriate procedural time out was performed.  The patient was then placed supine on the table and prepped and draped in the usual sterile fashion.  Ultrasound was used to evaluate the left common femoral artery.  It was echolucent and pulsatile indicating it is patent .  An ultrasound image was acquired for the permanent record.  A micropuncture needle was used to access the left common femoral artery under direct ultrasound guidance.  The microwire was then advanced under fluoroscopic guidance without difficulty followed by the micro-sheath.  A 0.035 J wire was advanced without resistance and a 5Fr sheath was placed.  Pigtail catheter was then advanced over a J-wire into the abdominal aorta and AP projection of the aorta is obtained.  The pigtail catheter was repositioned to above the iliac stents and bilateral oblique views of the pelvis are obtained.  Essentially at this point there was complete nonvisualization of the right common iliac and external iliac.  Previously placed stents are noted but there is no flow or contrast-enhancement.  Furthermore, there is no contrast-enhancement of the common femoral profunda femoris or superficial femoral arteries.  At this point an attempt to understand the current anatomy I elected to access the right common femoral.  The ultrasound was returned to the sterile field.  1% lidocaine is infiltrated in soft tissues the common femoral and subsequently the proximal SFA are imaged.  I elected to access the proximal SFA given the history that there is a femoral-femoral bypass and I hoped I would be able to negotiate the wire into the femoral-femoral bypass.  Under direct ultrasound visualization after an image of been recorded microneedle was inserted into the  SFA.  Microwire was then advanced under fluoroscopic guidance and it advances through the iliac stents.  It is not long enough to clear the stents into the aorta but it it does advance  at least to the level of the common iliac.  Microsheath is then inserted.  Hand-injection of contrast through the micro sheath demonstrates that the common femoral is patent the SFA is patent but quite small.  There are several branches.  But that appears to be the extent of what we have to work with.  I see absolutely no evidence of a femoral-femoral bypass from either the left or right groin injections.  At this point I gave 6000 units of heparin.  A 6 French sheath was then inserted over the advantage wire and exchanged for the micro sheath.  Kumpe catheter was then negotiated with the advantage wire into the aorta and hand-injection of contrast verified intraluminal positioning.  The penumbra CAT 6 device was prepped on the field and then mechanical thrombectomy was performed of the entire length of the external iliac artery as well as the common iliac artery.  A total of 3 passes were made.  Follow-up imaging demonstrated there was a greater than 60% stenosis at the ostia of the stent as well as a greater than 60% stenosis in the external iliac portion of the stent.  A 8 mm x 60 mm Ultraverse balloon was then advanced into the proximal common iliac inflation was to 10 atm for 1 minute.  The balloon was then repositioned in the external iliac artery and a second inflation was performed to 10 atm for 1 minute.  Following imaging now demonstrated less than 15% residual stenosis.  I then pulled the sheath back into the distal common femoral and identified a 60% stenosis in the mid femoral.  A 7 mm x 60 mm Lutonix drug-eluting balloon was advanced over the wire and angioplasty of the common femoral was performed.  Inflation was to 10 atm for approximately 1 minute.  Follow-up imaging demonstrated less than 15% residual stenosis.  However a piece of thrombus was now noted at the origin of the SFA.  The CAT 6 was reintroduced and thrombectomy of the common femoral was performed.  There was resolution of  thrombus after this.  Given by access site I did not feel comfortable doing a closure device on the right and pressure was held after the sheath was removed.  On the left side a StarClose device was deployed without difficulty.    Findings:   Aortogram: Abdominal aorta is patent.  The stents that extend into the aorta from the left common and right common iliac arteries are visualized.  On the left the entire stented iliac system is widely patent.  On the right it is occluded.  Following thrombectomy it is now patent but hemodynamically significant stenosis greater than 60% is noted in the external iliac as well as at the proximal portion of the common iliac.  These are treated with 8 mm balloon inflations resulting in less than 15% residual stenosis.  The common femoral on the right demonstrates a greater than 60% stenosis and this is treated with a 7 mm balloon inflation.  Thrombectomy was then performed of a small portion of thrombus that is identified.  I was not able to obtain significant imaging distally given the circumstances.    Summary: Successful reestablishment of inflow into the right common femoral artery.  At this point I will maintain the patient on Aggrastat drip  overnight.  This should provide relief of his rest pain.  My hope would be to obtain a CT angio with distal runoff so that I can better plan any further vascular intervention or surgery (surgery being much more likely based on the limited imaging I have been able to obtain thus far).  The patient remains at extremely high risk for limb loss   Disposition: Patient was taken to the recovery room in stable condition having tolerated the procedure well.  Jerry [name] 05/26/2022,8:46 PM

## 2022-05-26 NOTE — Progress Notes (Signed)
ANTICOAGULATION CONSULT NOTE - Initial Consult  Pharmacy Consult for IV heparin Indication:  limb ischemia  Not on File  Patient Measurements:   Heparin Dosing Weight: 56.2 kg  Vital Signs: Temp: 97.9 F (36.6 C) (03/01 1423) Temp Source: Oral (03/01 1423) BP: 150/74 (03/01 1423) Pulse Rate: 69 (03/01 1423)  Labs: Recent Labs    05/26/22 1445  HGB 14.5  HCT 43.2  PLT 200  LABPROT 13.8  INR 1.1  CREATININE 1.25*    Estimated Creatinine Clearance: 38.7 mL/min (A) (by C-G formula based on SCr of 1.25 mg/dL (H)).  Medical History: Past Medical History:  Diagnosis Date   Abdominal wall mass of right lower quadrant    Abnormal finding on EKG    Anemia    patient unaware of this diagnosis   Atherosclerosis of lower extremity with claudication ([place])    Cigarette smoker    Hearing loss    Hyperlipidemia    Leg DVT (deep venous thromboembolism), acute, left (HCC)    Peripheral vascular disease ([facility]CC)    blockages in both extremities   Vitamin D deficiency    patient unaware of this diagnosis   Wears dentures    Full upper, Partial lower   Medications:  No PTA anticoagulation per my chart review  Assessment: 77 year old male presenting with right leg numbness being admitted with critical limb ischemia of right lower extremity with nonbiologyical bypass graft and plans for angiogram.  H&H stable. INR 1.1, aPTT 32.  Goal of Therapy:  Heparin level 0.3-0.7 units/ml Monitor platelets by anticoagulation protocol: Yes   Plan:  Give 3300 units bolus x 1 Start heparin infusion at 900 units/hr Check anti-Xa level in 8 hours and daily while on heparin Continue to monitor H&H and platelets   Wynelle Cleveland 05/26/2022,3:16 PM

## 2022-05-27 ENCOUNTER — Inpatient Hospital Stay: Payer: Medicare PPO

## 2022-05-27 DIAGNOSIS — I70229 Atherosclerosis of native arteries of extremities with rest pain, unspecified extremity: Secondary | ICD-10-CM | POA: Diagnosis not present

## 2022-05-27 LAB — CBC WITH DIFFERENTIAL/PLATELET
Abs Immature Granulocytes: 0.04 10*3/uL (ref 0.00–0.07)
Basophils Absolute: 0 10*3/uL (ref 0.0–0.1)
Basophils Relative: 0 %
Eosinophils Absolute: 0 10*3/uL (ref 0.0–0.5)
Eosinophils Relative: 1 %
HCT: 39 % (ref 39.0–52.0)
Hemoglobin: 13.5 g/dL (ref 13.0–17.0)
Immature Granulocytes: 1 %
Lymphocytes Relative: 14 %
Lymphs Abs: 0.8 10*3/uL (ref 0.7–4.0)
MCH: 31.8 pg (ref 26.0–34.0)
MCHC: 34.6 g/dL (ref 30.0–36.0)
MCV: 91.8 fL (ref 80.0–100.0)
Monocytes Absolute: 0.5 10*3/uL (ref 0.1–1.0)
Monocytes Relative: 9 %
Neutro Abs: 4.3 10*3/uL (ref 1.7–7.7)
Neutrophils Relative %: 75 %
Platelets: 180 10*3/uL (ref 150–400)
RBC: 4.25 MIL/uL (ref 4.22–5.81)
RDW: 13.4 % (ref 11.5–15.5)
WBC: 5.7 10*3/uL (ref 4.0–10.5)
nRBC: 0 % (ref 0.0–0.2)

## 2022-05-27 LAB — BASIC METABOLIC PANEL
Anion gap: 10 (ref 5–15)
Anion gap: 8 (ref 5–15)
BUN: 16 mg/dL (ref 8–23)
BUN: 17 mg/dL (ref 8–23)
CO2: 18 mmol/L — ABNORMAL LOW (ref 22–32)
CO2: 19 mmol/L — ABNORMAL LOW (ref 22–32)
Calcium: 8.9 mg/dL (ref 8.9–10.3)
Calcium: 8.9 mg/dL (ref 8.9–10.3)
Chloride: 106 mmol/L (ref 98–111)
Chloride: 107 mmol/L (ref 98–111)
Creatinine, Ser: 0.95 mg/dL (ref 0.61–1.24)
Creatinine, Ser: 0.94 mg/dL (ref 0.61–1.24)
GFR, Estimated: >60 mL/min (ref >60)
GFR, Estimated: >60 mL/min (ref >60)
Glucose, Bld: 126 mg/dL — ABNORMAL HIGH (ref 70–99)
Glucose, Bld: 115 mg/dL — ABNORMAL HIGH (ref 70–99)
Potassium: 3.9 mmol/L (ref 3.5–5.1)
Potassium: 3.9 mmol/L (ref 3.5–5.1)
Sodium: 134 mmol/L — ABNORMAL LOW (ref 135–145)
Sodium: 134 mmol/L — ABNORMAL LOW (ref 135–145)

## 2022-05-27 LAB — CK: Total CK: 3681 U/L — ABNORMAL HIGH (ref 49–397)

## 2022-05-27 LAB — MRSA NEXT GEN BY PCR, NASAL: MRSA by PCR Next Gen: NOT DETECTED

## 2022-05-27 MED ORDER — QUETIAPINE FUMARATE 25 MG PO TABS
25.0000 mg | ORAL_TABLET | Freq: Once | ORAL | Status: AC
  Administered 2022-05-27: 25 mg via ORAL
  Filled 2022-05-27: qty 1

## 2022-05-27 MED ORDER — HEPARIN (PORCINE) 25000 UT/250ML-% IV SOLN
900.0000 [IU]/h | INTRAVENOUS | Status: DC
  Administered 2022-05-27: 900 [IU]/h via INTRAVENOUS
  Filled 2022-05-27: qty 250

## 2022-05-27 MED ORDER — CHLORHEXIDINE GLUCONATE CLOTH 2 % EX PADS
6.0000 | MEDICATED_PAD | Freq: Every day | CUTANEOUS | Status: DC
  Administered 2022-05-28 – 2022-05-29 (×2): 6 via TOPICAL

## 2022-05-27 MED ORDER — CLOPIDOGREL BISULFATE 75 MG PO TABS
75.0000 mg | ORAL_TABLET | Freq: Every day | ORAL | Status: DC
  Administered 2022-05-27 – 2022-06-01 (×6): 75 mg via ORAL
  Filled 2022-05-27 (×6): qty 1

## 2022-05-27 MED ORDER — ORAL CARE MOUTH RINSE
15.0000 mL | OROMUCOSAL | Status: DC
  Administered 2022-05-27 – 2022-05-30 (×10): 15 mL via OROMUCOSAL

## 2022-05-27 MED ORDER — IOHEXOL 350 MG/ML SOLN
125.0000 mL | Freq: Once | INTRAVENOUS | Status: AC | PRN
  Administered 2022-05-27: 125 mL via INTRAVENOUS

## 2022-05-27 MED ORDER — HEPARIN BOLUS VIA INFUSION: 3300.0000 [IU] | Freq: Once | INTRAVENOUS | Status: DC

## 2022-05-27 MED ORDER — ASPIRIN 81 MG PO TBEC
81.0000 mg | DELAYED_RELEASE_TABLET | Freq: Every day | ORAL | Status: DC
  Administered 2022-05-27 – 2022-06-01 (×5): 81 mg via ORAL
  Filled 2022-05-27 (×6): qty 1

## 2022-05-27 MED ORDER — ORAL CARE MOUTH RINSE: 15.0000 mL | OROMUCOSAL | Status: DC | PRN

## 2022-05-27 MED ORDER — QUETIAPINE FUMARATE 25 MG PO TABS
50.0000 mg | ORAL_TABLET | Freq: Every day | ORAL | Status: DC
  Administered 2022-05-27 – 2022-05-31 (×5): 50 mg via ORAL
  Filled 2022-05-27 (×5): qty 2

## 2022-05-27 NOTE — Consult Note (Signed)
NAME:  Jerry [name], MRN:  [id number], DOB:  June 16, 1945, LOS: 1 ADMISSION DATE:  05/26/2022, CONSULTATION DATE:  05/26/22 REFERRING MD:  Hortencia Pilar, CHIEF COMPLAINT:  Left leg pain    HPI  77 y.o male with significant PMH as below who presented to the ED  from his primary care provider [name] with severe right lower extremity pain x 1 week.  Per his primary care note, patient reported progressive worsening of symptoms since Monday. He described claudication like pain severe at rest. Bedside studies showed an absent ABI on the right and ABI of 1.02 on the left.The patient has a noted occluded femorofemoral bypass graft. No flow has been detected in the distal AT PT or peroneal arteries via duplex. The patient has hx of atherosclerotic occlusive disease and claudication of LLE s/p Femoral artery endarterectomy, stent placement to distal aorta and multiple stent placement to right and left iliac arteries in 2019.   ED Course: Initial vital signs showed HR of 69 beats/minute, BP 150/74 mm Hg, the RR 16 breaths/minute, and the oxygen saturation 100% on RA and a temperature of 97.54F (36.6C).    Pertinent Labs/Diagnostics Findings: Na+/ K+:  Glucose: BUN/Cr.:Calcium:  AST/ALT: WBC: Hgb/Hct: Plts:  PCT: negative <0.10 Lactic acid:  COVID PCR: Negative, Troponin:  BNP:    Patient was taken to OR for right lower extremity angiogram with possible intervention with lysis catheter placement for tPA overnight and placement of triple-lumen catheter.  He was subsequently admitted to ICU post procedure for further workup and treatment. PCCM consulted for management of pain and agitation/delirium.   Past Medical History  Anemia, Cigarette smoker,  Hearing loss,  Hyperlipidemia,  Peripheral vascular disease Vitamin D deficiency. DVT Prostrate Cancer Left Leg stent (Left, 2009); Lower Extremity Angiography (Left, 09/19/2017) Mitral valve replacement (Right, 08/2017).  Significant Hospital  Events   05/26/22:Admitted to ICU with Critical limb ischemia of right lower extremity s/p right common femoral artery recannulation  3/2: per report, was agitated overnight. Started on precedex gtt.    Consults:  PCCM  Procedures:  05/26/22: Angioplasty and Mechanical thrombectomy of the right external iliac artery and right common iliac artery   Significant Diagnostic Tests:  None  Interim History / Subjective:  -Patient incredibly sleepy. He says he had a good night.   Micro Data:  None  Antimicrobials:  None  OBJECTIVE  Blood pressure (!) 123/57, pulse (!) 50, temperature 98.6 F (37 C), temperature source Oral, resp. rate 12, SpO2 94 %.        Intake/Output Summary (Last 24 hours) at 05/27/2022 0810 Last data filed at 05/27/2022 0600 Gross per 24 hour  Intake 1078.36 ml  Output 525 ml  Net 553.36 ml   There were no vitals filed for this visit.  Physical Examination  GENERAL:  year-old critically ill patient lying in the bed in no acute distress EYES: PEERLA. No scleral icterus. Extraocular muscles intact.  HEENT: Head atraumatic, normocephalic. Oropharynx and nasopharynx clear.  NECK:  No JVD, supple  LUNGS: Normal breath sounds bilaterally.  No use of accessory muscles of respiration.  CARDIOVASCULAR: S1, S2 normal. No murmurs, rubs, or gallops.  ABDOMEN: Soft, NTND EXTREMITIES: Bilateral Cold, painful, pale limbs with absent pulses on the right Motor weakness. Decreased Sensation. Capillary refill is less than 3 seconds in all extremities NEUROLOGIC: The patient is oriented to self only. Mildly agitated and delirious. Cranial nerves are intact.  SKIN: No obvious rash, lesion, or ulcer. Warm to touch  Labs/imaging that I havepersonally reviewed  (right click and "Reselect all SmartList Selections" daily)     Labs   CBC: Recent Labs  Lab 05/26/22 1445 05/27/22 0640  WBC 7.7 5.7  NEUTROABS  --  4.3  HGB 14.5 13.5  HCT 43.2 39.0  MCV 93.5 91.8  PLT 200 180     Basic Metabolic Panel: Recent Labs  Lab 05/26/22 1445 05/27/22 0640  NA 137 134*  K 4.0 3.9  CL 103 106  CO2 24 18*  GLUCOSE 96 126*  BUN 17 16  CREATININE 1.25* 0.95  CALCIUM 9.4 8.9   GFR: Estimated Creatinine Clearance: 50.9 mL/min (by C-G formula based on SCr of 0.95 mg/dL). Recent Labs  Lab 05/26/22 1445 05/27/22 0640  WBC 7.7 5.7    Liver Function Tests: No results for input(s): "AST", "ALT", "ALKPHOS", "BILITOT", "PROT", "ALBUMIN" in the last 168 hours. No results for input(s): "LIPASE", "AMYLASE" in the last 168 hours. No results for input(s): "AMMONIA" in the last 168 hours.  ABG No results found for: "PHART", "PCO2ART", "PO2ART", "HCO3", "TCO2", "ACIDBASEDEF", "O2SAT"   Coagulation Profile: Recent Labs  Lab 05/26/22 1445  INR 1.1    Cardiac Enzymes: No results for input(s): "CKTOTAL", "CKMB", "CKMBINDEX", "TROPONINI" in the last 168 hours.  HbA1C: No results found for: "HGBA1C"  CBG: Recent Labs  Lab 05/26/22 2131  GLUCAP 112*    Review of Systems:   Unable to obtain due to delirium  Past Medical History  He,  has a past medical history of Abdominal wall mass of right lower quadrant, Abnormal finding on EKG, Anemia, Atherosclerosis of lower extremity with claudication ([place]), Cigarette smoker, Hearing loss, Hyperlipidemia, Leg DVT (deep venous thromboembolism), acute, left ([place]), Peripheral vascular disease ([place]), Vitamin D deficiency, and Wears dentures.   Surgical History    Past Surgical History:  Procedure Laterality Date   ANGIOPLASTY ILLIAC ARTERY Left 10/03/2017   Procedure: ANGIOPLASTY ILIAC ARTERY;  Surgeon: Algernon Huxley, MD;  Location: [facility];  Service: Vascular;  Laterality: Left;   BACK SURGERY  1990   Three sx in lower back. metal in lower back   CATARACT EXTRACTION W/PHACO Left 08/17/2021   Procedure: CATARACT EXTRACTION PHACO AND INTRAOCULAR LENS PLACEMENT (IOC) LEFT 3.48 00:30.9;  Surgeon: Eulogio Bear, MD;   Location: [facility];  Service: Ophthalmology;  Laterality: Left;   CATARACT EXTRACTION W/PHACO Right 09/12/2021   Procedure: CATARACT EXTRACTION PHACO AND INTRAOCULAR LENS PLACEMENT (IOC) RIGHT 5.74 00:42.3;  Surgeon: Eulogio Bear, MD;  Location: [facility];  Service: Ophthalmology;  Laterality: Right;   COLONOSCOPY  01/2010   ENDARTERECTOMY FEMORAL Left 10/03/2017   Procedure: ENDARTERECTOMY FEMORAL;  Surgeon: Algernon Huxley, MD;  Location: [facility];  Service: Vascular;  Laterality: Left;   Left Leg stent Left 2009   fem fem bypass   LOWER EXTREMITY ANGIOGRAPHY Left 09/19/2017   Procedure: LOWER EXTREMITY ANGIOGRAPHY;  Surgeon: Algernon Huxley, MD;  Location: [facility];  Service: Cardiovascular;  Laterality: Left;   LOWER EXTREMITY ANGIOGRAPHY Right 01/07/2018   Procedure: LOWER EXTREMITY ANGIOGRAPHY;  Surgeon: Algernon Huxley, MD;  Location: [facility];  Service: Cardiovascular;  Laterality: Right;   PTCA Right 08/2017   VASCULAR SURGERY Left 2009   fem fem bypass      Social History   reports that he has been smoking cigarettes. He has a 20.00 pack-year smoking history. He has never used smokeless tobacco. He reports that he does not drink  alcohol and does not use drugs.   Family History   His family history is negative for Colon cancer.   Allergies No Known Allergies   Home Medications  Prior to Admission medications   Not on File    Scheduled Meds:  atorvastatin  10 mg Oral q1800   Chlorhexidine Gluconate Cloth  6 each Topical Daily   mouth rinse  15 mL Mouth Rinse 4 times per day   QUEtiapine  25 mg Oral Once   QUEtiapine  50 mg Oral QHS   sodium chloride flush  3 mL Intravenous Q12H   Continuous Infusions:  sodium chloride     dexmedetomidine (PRECEDEX) IV infusion 0.5 mcg/kg/hr (05/27/22 0600)   heparin 900 Units/hr (05/27/22 0644)   PRN Meds:.sodium chloride, acetaminophen, hydrALAZINE, labetalol, morphine injection, ondansetron  (ZOFRAN) IV, mouth rinse, oxyCODONE, sodium chloride flush   Active Hospital Problem list     Assessment & Plan:  #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 of the right foot; occlusion previously placed iliac stents  S/p Angioplasty and mechanical thrombectomy of the right external iliac artery and right common iliac artery  -Monitor UOP  -Monitor for s/sx of bleeding  -Neurovascular checks per protocol -Continue Aggrastat drip per vascular -Plan to return to the vascular lab tomorrow 05/27/22 for completion of the procedure with removal of the lysis catheter and tPA    #Delirium/Agitation  Hx: Current everyday smoker and Hyperlipidemia  -Supplemental O2 or prn Bipap for dyspnea and/or hypoxia  -Continuous telemetry monitoring  -Trend BMP  -Replace electrolytes as indicated  -Prn zofran and phenergan for nausea/vomiting -Prn morphine and percocet for pain management  -Working to titrate down precedex -Starting seroquel once and then a standing qhs order   Best practice:  Diet:  NPO Pain/Anxiety/Delirium protocol (if indicated): Yes (RASS goal -1) VAP protocol (if indicated): Not indicated DVT prophylaxis: Systemic AC GI prophylaxis: N/A Glucose control:  SSI Yes Central venous access:  N/A Arterial line:  N/A Foley:  Yes, and it is still needed Mobility:  bed rest  PT consulted: N/A Last date of multidisciplinary goals of care discussion [05/27/22] Code Status:  full code Disposition: ICU   = Goals of Care = Code Status Order: FULL  Primary Emergency Contact: Beg,Joyce, Home Phone: [phone] Wishes to pursue full aggressive treatment and intervention options, including CPR and intubation, but goals of care will be addressed on going with family if that should become necessary.   Critical care time: 35 minutes      Arcelia Jew MD Pulmonary & Critical Care Medicine

## 2022-05-27 NOTE — Progress Notes (Addendum)
      Daily Progress Note  Discussed with ?ICU MD pt's perfusion status.  Pt has known R ABI 0.  Pt underwent R iliac recannulation today.  Reportedly runoff was not done.  Per Dr. Ronalee Belts, pt needs additional CTA abd/pelvis with runoff.  - Unable to access imaging studies - Will order CTA abd/pelvis with runoff for tomorrow AM given recent contrast exposure    Adele Barthel, MD, FACS, FSVS Covering for [facility]: [phone]  05/27/2022, 12:59 AM

## 2022-05-27 NOTE — Progress Notes (Signed)
[facility] for IV heparin Indication:  limb ischemia  No Known Allergies  Patient Measurements:   Heparin Dosing Weight: 56.2 kg  Vital Signs: Temp: 98.6 F (37 C) (03/02 0400) Temp Source: Oral (03/02 0400) BP: 123/57 (03/02 0600) Pulse Rate: 50 (03/02 0600)  Labs: Recent Labs    05/26/22 1445  HGB 14.5  HCT 43.2  PLT 200  APTT 32  LABPROT 13.8  INR 1.1  CREATININE 1.25*     Estimated Creatinine Clearance: 38.7 mL/min (A) (by C-G formula based on SCr of 1.25 mg/dL (H)).  Medical History: Past Medical History:  Diagnosis Date   Abdominal wall mass of right lower quadrant    Abnormal finding on EKG    Anemia    patient unaware of this diagnosis   Atherosclerosis of lower extremity with claudication ([place])    Cigarette smoker    Hearing loss    Hyperlipidemia    Leg DVT (deep venous thromboembolism), acute, left ([facility])    Peripheral vascular disease ([facility])    blockages in both extremities   Vitamin D deficiency    patient unaware of this diagnosis   Wears dentures    Full upper, Partial lower   Medications:  No PTA anticoagulation per my chart review  Assessment: 77 year old male presenting with right leg numbness being admitted with critical limb ischemia of right lower extremity with nonbiologyical bypass graft and plans for angiogram.  S/p mechanical thrombectomy of the right external iliac artery and right common iliac artery   Goal of Therapy:  Heparin level 0.3-0.7 units/ml Monitor platelets by anticoagulation protocol: Yes   Plan:  Tirofiban '@0'$ .15 mcg/kg/min until 05/27/22 '@0700'$  per consult. Will restart heparin at 900 units/hr @ 0700. Check heparin level in 8 hours. CBC daily while on heparin.   Oswald Hillock, PharmD, BCPS 05/27/2022,6:43 AM

## 2022-05-27 NOTE — Progress Notes (Addendum)
      Daily Progress Note   Assessment/Planning:   POD #1 s/p L TE R EIA & CIA, PTA R CIA, EIA and CFA  CTA abd/pelvis demonstrates: Occluded fem-fem BPG Patent R iliac arterial system Dominant runoff in both legs is PFA R SFA disease evident: unclear if chronic vs SFA access Multiple level disease evident Pt has improved sx BLE ABI If R ABI still critical, will add pt on schedule Monday for RRo, possible intervention D/C heparin and Aggrastat Ok to transfer to stepdown   Subjective  - 1 Day Post-Op   Confused and complaining of leg pain overnight, this AM pain improved from preio   Objective   Vitals:   05/27/22 0300 05/27/22 0400 05/27/22 0500 05/27/22 0600  BP: (!) 121/59 (!) 113/56 (!) 125/54 (!) 123/57  Pulse: (!) 55 (!) 51 (!) 47 (!) 50  Resp: 13 (!) '9 12 12  '$ Temp:  98.6 F (37 C)    TempSrc:  Oral    SpO2: 96% 94% 97% 94%     Intake/Output Summary (Last 24 hours) at 05/27/2022 0958 Last data filed at 05/27/2022 0600 Gross per 24 hour  Intake 1078.36 ml  Output 525 ml  Net 553.36 ml    PULM  CTAB  CV  RRR  GI  soft, NTND  VASC Palpable B femoral, no palpable pedal pulse  NEURO A&O x 3    Laboratory   CBC    Latest Ref Rng & Units 05/27/2022    6:40 AM 05/26/2022    2:45 PM 04/17/2022    9:52 AM  CBC  WBC 4.0 - 10.5 K/uL 5.7  7.7  5.1   Hemoglobin 13.0 - 17.0 g/dL 13.5  14.5  14.9   Hematocrit 39.0 - 52.0 % 39.0  43.2  42.9   Platelets 150 - 400 K/uL 180  200  191     BMET    Component Value Date/Time   NA 134 (L) 05/27/2022 0859   NA 139 07/18/2011 0934   K 3.9 05/27/2022 0859   K 4.4 07/18/2011 0934   CL 107 05/27/2022 0859   CL 103 07/18/2011 0934   CO2 19 (L) 05/27/2022 0859   CO2 25 07/18/2011 0934   GLUCOSE 115 (H) 05/27/2022 0859   GLUCOSE 183 (H) 07/18/2011 0934   BUN 17 05/27/2022 0859   BUN 23 (H) 07/18/2011 0934   CREATININE 0.94 05/27/2022 0859   CREATININE 1.47 (H) 07/18/2011 0934   CALCIUM 8.9 05/27/2022 0859   CALCIUM  10.1 07/18/2011 0934   GFRNONAA >60 05/27/2022 0859   GFRNONAA 49 (L) 07/18/2011 0934   GFRAA >60 01/04/2018 1021   GFRAA 56 (L) 07/18/2011 0934     Adele Barthel, MD, FACS, FSVS Covering for [facility]: [phone]  05/27/2022, 9:58 AM

## 2022-05-27 NOTE — Progress Notes (Signed)
eLink Physician-Brief Progress Note Patient Name: Jerry [name] DOB: 1945/04/04 MRN: [id number]   Date of Service  05/27/2022  HPI/Events of Note  77 yr old male smoker with known severe PVD presented with lower ext pain related to critical limb ischemia for which he underwent intervention by vascular surgery.  Presently resting on room air oxygen on precedex drip.  Further evaluation and intervention may be needed.  eICU Interventions  Chart reviewed     Intervention Category Evaluation Type: New Patient Evaluation  Mauri [place], P 05/27/2022, 3:18 AM

## 2022-05-28 LAB — CBC
HCT: 36.7 % — ABNORMAL LOW (ref 39.0–52.0)
Hemoglobin: 12.7 g/dL — ABNORMAL LOW (ref 13.0–17.0)
MCH: 31.3 pg (ref 26.0–34.0)
MCHC: 34.6 g/dL (ref 30.0–36.0)
MCV: 90.4 fL (ref 80.0–100.0)
Platelets: 183 10*3/uL (ref 150–400)
RBC: 4.06 MIL/uL — ABNORMAL LOW (ref 4.22–5.81)
RDW: 13.3 % (ref 11.5–15.5)
WBC: 6.4 10*3/uL (ref 4.0–10.5)
nRBC: 0 % (ref 0.0–0.2)

## 2022-05-28 LAB — BASIC METABOLIC PANEL
Anion gap: 9 (ref 5–15)
BUN: 21 mg/dL (ref 8–23)
CO2: 21 mmol/L — ABNORMAL LOW (ref 22–32)
Calcium: 8.8 mg/dL — ABNORMAL LOW (ref 8.9–10.3)
Chloride: 105 mmol/L (ref 98–111)
Creatinine, Ser: 1.23 mg/dL (ref 0.61–1.24)
GFR, Estimated: >60 mL/min (ref >60)
Glucose, Bld: 102 mg/dL — ABNORMAL HIGH (ref 70–99)
Potassium: 3.7 mmol/L (ref 3.5–5.1)
Sodium: 135 mmol/L (ref 135–145)

## 2022-05-28 NOTE — Progress Notes (Addendum)
      Daily Progress Note   Assessment/Planning:   POD #2 s/p R CIA and EIA suction TE, R CIA, EIA and CFA PTA  Sx improved and right foot warmer today ABI demonstrates waveforms in PTA and ATA.  Reportedly [place] arteries Pt has residual disease in R leg based on CTA Will try to add pt on tomorrow/Tues for R leg angiogram possible intervention   Subjective  - 2 Days Post-Op   Rt leg feels better   Objective   Vitals:   05/28/22 0410 05/28/22 0500 05/28/22 0600 05/28/22 0630  BP:  (!) 104/90  (!) 133/54  Pulse: 68 (!) 57 73 (!) 56  Resp: '16 10 18 10  '$ Temp:      TempSrc:      SpO2: 95% 95% 98% 97%     Intake/Output Summary (Last 24 hours) at 05/28/2022 0830 Last data filed at 05/28/2022 0500 Gross per 24 hour  Intake 240 ml  Output 825 ml  Net -585 ml    PULM  CTAB  CV  RRR  GI  soft, NTND  VASC L groin: no active bleeding, no hematoma or PSA; R foot warm and viable  NEURO Improved DF/PF R foot, sensation decreased but intact    Laboratory   CBC    Latest Ref Rng & Units 05/28/2022    4:17 AM 05/27/2022    6:40 AM 05/26/2022    2:45 PM  CBC  WBC 4.0 - 10.5 K/uL 6.4  5.7  7.7   Hemoglobin 13.0 - 17.0 g/dL 12.7  13.5  14.5   Hematocrit 39.0 - 52.0 % 36.7  39.0  43.2   Platelets 150 - 400 K/uL 183  180  200     BMET    Component Value Date/Time   NA 135 05/28/2022 0417   NA 139 07/18/2011 0934   K 3.7 05/28/2022 0417   K 4.4 07/18/2011 0934   CL 105 05/28/2022 0417   CL 103 07/18/2011 0934   CO2 21 (L) 05/28/2022 0417   CO2 25 07/18/2011 0934   GLUCOSE 102 (H) 05/28/2022 0417   GLUCOSE 183 (H) 07/18/2011 0934   BUN 21 05/28/2022 0417   BUN 23 (H) 07/18/2011 0934   CREATININE 1.23 05/28/2022 0417   CREATININE 1.47 (H) 07/18/2011 0934   CALCIUM 8.8 (L) 05/28/2022 0417   CALCIUM 10.1 07/18/2011 0934   GFRNONAA >60 05/28/2022 0417   GFRNONAA 49 (L) 07/18/2011 0934   GFRAA >60 01/04/2018 1021   GFRAA 56 (L) 07/18/2011 0934     Adele Barthel, MD, FACS,  [facility] Covering for [facility]: [phone]  05/28/2022, 8:30 AM

## 2022-05-29 ENCOUNTER — Encounter: Payer: Self-pay | Admitting: Vascular Surgery

## 2022-05-29 LAB — BASIC METABOLIC PANEL
Anion gap: 9 (ref 5–15)
BUN: 21 mg/dL (ref 8–23)
CO2: 21 mmol/L — ABNORMAL LOW (ref 22–32)
Calcium: 9.4 mg/dL (ref 8.9–10.3)
Chloride: 105 mmol/L (ref 98–111)
Creatinine, Ser: 1.05 mg/dL (ref 0.61–1.24)
GFR, Estimated: >60 mL/min (ref >60)
Glucose, Bld: 96 mg/dL (ref 70–99)
Potassium: 4 mmol/L (ref 3.5–5.1)
Sodium: 135 mmol/L (ref 135–145)

## 2022-05-29 LAB — CBC
HCT: 38.6 % — ABNORMAL LOW (ref 39.0–52.0)
Hemoglobin: 13.1 g/dL (ref 13.0–17.0)
MCH: 31.3 pg (ref 26.0–34.0)
MCHC: 33.9 g/dL (ref 30.0–36.0)
MCV: 92.1 fL (ref 80.0–100.0)
Platelets: 194 10*3/uL (ref 150–400)
RBC: 4.19 MIL/uL — ABNORMAL LOW (ref 4.22–5.81)
RDW: 13.2 % (ref 11.5–15.5)
WBC: 5.8 10*3/uL (ref 4.0–10.5)
nRBC: 0 % (ref 0.0–0.2)

## 2022-05-29 LAB — VAS US ABI WITH/WO TBI
Left ABI: 1.02
Right ABI: 0

## 2022-05-30 MED ORDER — CHLORHEXIDINE GLUCONATE CLOTH 2 % EX PADS
6.0000 | MEDICATED_PAD | Freq: Once | CUTANEOUS | Status: AC
  Administered 2022-05-31: 6 via TOPICAL

## 2022-05-30 MED ORDER — SODIUM CHLORIDE 0.9 % IV SOLN: INTRAVENOUS | Status: DC

## 2022-05-30 MED ORDER — MUPIROCIN 2 % EX OINT
1.0000 | TOPICAL_OINTMENT | Freq: Two times a day (BID) | CUTANEOUS | Status: DC
  Administered 2022-05-31 – 2022-06-01 (×3): 1 via NASAL
  Filled 2022-05-30: qty 22

## 2022-05-30 NOTE — H&P (View-Only) (Signed)
Progress Note    05/30/2022 11:00 AM 4 Days Post-Op  Subjective: [name] is a 77 year old male who is now status postop day #4 from mechanical thrombectomy of the right external iliac artery and right common iliac artery with angioplasty of the right external iliac artery and angioplasty of the right common iliac artery and angioplasty of the right common femoral artery.  This morning the patient is resting comfortably in bed.  Patient states he is having less pain in his right lower extremity than normal.  He states that his leg feels warmer this morning as well.  Patient ambulated with the mobility specialist and completed one lap around the nurses station. Patient was able to ambulate 20 feet without any assistive devices No other complaints at this time.  Vitals are remained stable.   Vitals:   05/30/22 0517 05/30/22 0729  BP: (!) 155/63 115/67  Pulse: (!) 51 (!) 57  Resp: 18 16  Temp: 98.1 F (36.7 C) 97.8 F (36.6 C)  SpO2: 99% 97%   Physical Exam: Cardiac:  RRR Lungs: Clear throughout on auscultation.  Slightly diminished in the bases. Incisions: Bilateral groin incisions with dressings clean dry and intact.  No hematoma no seroma to note. Extremities: Right lower extremity warm to touch. Palpable PT Pulse.  Abdomen: Positive bowel sounds, soft, flat, nontender, nondistended. Neurologic: Alert and oriented x 4.  Answers all questions appropriately.  Patient is completely neuro intact this morning.  CBC    Component Value Date/Time   WBC 5.8 05/29/2022 0510   RBC 4.19 (L) 05/29/2022 0510   HGB 13.1 05/29/2022 0510   HGB 17.5 07/18/2011 0934   HCT 38.6 (L) 05/29/2022 0510   HCT 53.1 (H) 07/18/2011 0934   PLT 194 05/29/2022 0510   PLT 267 07/18/2011 0934   MCV 92.1 05/29/2022 0510   MCV 98 07/18/2011 0934   MCH 31.3 05/29/2022 0510   MCHC 33.9 05/29/2022 0510   RDW 13.2 05/29/2022 0510   RDW 13.7 07/18/2011 0934   LYMPHSABS 0.8 05/27/2022 0640   MONOABS 0.5  05/27/2022 0640   EOSABS 0.0 05/27/2022 0640   BASOSABS 0.0 05/27/2022 0640    BMET    Component Value Date/Time   NA 135 05/29/2022 0510   NA 139 07/18/2011 0934   K 4.0 05/29/2022 0510   K 4.4 07/18/2011 0934   CL 105 05/29/2022 0510   CL 103 07/18/2011 0934   CO2 21 (L) 05/29/2022 0510   CO2 25 07/18/2011 0934   GLUCOSE 96 05/29/2022 0510   GLUCOSE 183 (H) 07/18/2011 0934   BUN 21 05/29/2022 0510   BUN 23 (H) 07/18/2011 0934   CREATININE 1.05 05/29/2022 0510   CREATININE 1.47 (H) 07/18/2011 0934   CALCIUM 9.4 05/29/2022 0510   CALCIUM 10.1 07/18/2011 0934   GFRNONAA >60 05/29/2022 0510   GFRNONAA 49 (L) 07/18/2011 0934   GFRAA >60 01/04/2018 1021   GFRAA 56 (L) 07/18/2011 0934    INR    Component Value Date/Time   INR 1.1 05/26/2022 1445     Intake/Output Summary (Last 24 hours) at 05/30/2022 1100 Last data filed at 05/30/2022 1046 Gross per 24 hour  Intake 1482.03 ml  Output 1525 ml  Net -42.97 ml     Assessment/Plan:  77 y.o. male is s/p chemical thrombectomy of the right lower extremity.  4 Days Post-Op   Plan: Patient will be taken back to the vascular lab for repeat diagnostic angiogram of the aorta and bilateral lower  extremities.  Dr. [name] MD 6 poke with the patient about the procedure yesterday on 05/29/2022.  I have spoken with the patient in detail this morning about the procedure, benefits, risks, and complications.  Patient verbalizes understanding.  Answered all patient's questions this morning.  Patient would like to proceed with the procedure tomorrow as planned.  Patient will be made n.p.o. after midnight.  Procedure will be done under general anesthesia.  This was set up with the operating room yesterday and they are aware.  Plan was discussed with Dr. [name] MD.  He is in agreement with the plan.  DVT prophylaxis: ASA 81 mg daily, Plavix 75 mg daily.   Drema [facility] 05/30/2022 11:00 AM

## 2022-05-30 NOTE — TOC Initial Note (Signed)
Transition of Care [facility]) - Initial/Assessment Note    Patient Details  Name: Jerry [name] MRN: [id number] Date of Birth: 23-Nov-1945  Transition of [facility]) CM/SW Contact:    Candie Chroman, LCSW Phone Number: 05/30/2022, 12:56 PM  Clinical Narrative:   CSW met with patient. No supports at bedside. SDOH flag for food insecurity. Patient denied concern. Patient confirmed he has stable housing and has no issue paying rent/mortgage. He did report issues with paying hospital/doctor bills. Encouraged him to call phone number on bill once received to ask about assistance. No further concerns. CSW encouraged patient to contact CSW as needed. CSW will continue to follow patient for support and facilitate return home once stable. His wife will transport him home at discharge.               Expected Discharge Plan: Home/Self Care Barriers to Discharge: Continued Medical Work up   Patient Goals and CMS Choice            Expected Discharge Plan and Services     Post Acute Care Choice: NA Living arrangements for the past 2 months: Single Family Home                                      Prior Living Arrangements/Services Living arrangements for the past 2 months: Single Family Home Lives with:: Spouse Patient language and need for interpreter reviewed:: Yes Do you feel safe going back to the place where you live?: Yes      Need for Family Participation in Patient Care: Yes (Comment) Care giver support system in place?: Yes (comment)   Criminal Activity/Legal Involvement Pertinent to Current Situation/Hospitalization: No - Comment as needed  Activities of Daily Living      Permission Sought/Granted                  Emotional Assessment Appearance:: Appears stated age Attitude/Demeanor/Rapport: Engaged Affect (typically observed): Appropriate, Calm Orientation: : Oriented to Self, Oriented to Place, Oriented to  Time, Oriented to Situation Alcohol / Substance Use:  Not Applicable Psych Involvement: No (comment)  Admission diagnosis:  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 ([place]) [I70.621] Acute lower limb ischemia [I99.8] Atherosclerosis of artery of extremity with rest pain ([place]) [I70.229] Patient Active Problem List   Diagnosis Date Noted   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 ([place]) 05/26/2022   Atherosclerosis of artery of extremity with rest pain ([place]) [id number]   Periumbilical abdominal pain 04/18/2022   Prostate cancer ([place]) 04/17/2022   Prostate nodule 02/12/2018   Abnormal MRI, pelvis 02/12/2018   Incontinence of urine 12/21/2017   Atherosclerosis with claudication of extremity ([place]) 10/03/2017   Abnormal finding on EKG 09/25/2017   Abdominal wall mass of right lower quadrant 09/14/2017   Loss of weight 09/14/2017   Hyperlipidemia 09/04/2017   Tobacco use disorder 09/04/2017   Atherosclerosis of lower extremity with claudication ([place]) 09/04/2017   Claudication ([place]) 06/14/2017   PCP:  Marguerita Merles, MD Pharmacy:   [id number] [address] [place]: [phone]: [facility] [place] [address]Phone: [phone]: [phone]    Social Determinants of Health (SDOH)  Social History: SDOH Screenings   Food Insecurity: Food Insecurity Present (04/17/2022)  Housing: Medium Risk (04/17/2022)  Transportation Needs: No Transportation Needs (04/17/2022)  Utilities: Not At Risk (04/17/2022)  Depression (PHQ2-9): Low Risk  (04/17/2022)  Financial Resource Strain: Medium Risk (04/17/2022)  Tobacco Use: High Risk (05/29/2022)   SDOH Interventions: Food Insecurity Interventions: Patient Refused, Inpatient TOC Housing Interventions: Patient Refused,  Inpatient TOC   Readmission Risk Interventions     No data to display

## 2022-05-30 NOTE — Progress Notes (Signed)
Mobility Specialist - Progress Note   05/30/22 1147  Mobility  Activity Ambulated with assistance in hallway  Level of Assistance Standby assist, set-up cues, supervision of patient - no hands on  Assistive Device Front wheel walker  Distance Ambulated (ft) 160 ft  Activity Response Tolerated well  $Mobility charge 1 Mobility   Pt supine upon entry, utilizing RA. Pt completed bed mob ModI; extra time needed to bring trunk upright. Pt STS to RW MinA and amb SBA. Pt ambulated one lap around NS, tolerated well. Pt amb without AD for approximately 20 ft, no LOB noticed. Pt returned to the room, left supine with alarm set and needs within reach.   Candie Mile Mobility Specialist 05/30/22 11:51 AM

## 2022-05-30 NOTE — Progress Notes (Signed)
Progress Note    05/30/2022 11:00 AM 4 Days Post-Op  Subjective: [name] is a 77 year old male who is now status postop day #4 from mechanical thrombectomy of the right external iliac artery and right common iliac artery with angioplasty of the right external iliac artery and angioplasty of the right common iliac artery and angioplasty of the right common femoral artery.  This morning the patient is resting comfortably in bed.  Patient states he is having less pain in his right lower extremity than normal.  He states that his leg feels warmer this morning as well.  Patient ambulated with the mobility specialist and completed one lap around the nurses station. Patient was able to ambulate 20 feet without any assistive devices No other complaints at this time.  Vitals are remained stable.   Vitals:   05/30/22 0517 05/30/22 0729  BP: (!) 155/63 115/67  Pulse: (!) 51 (!) 57  Resp: 18 16  Temp: 98.1 F (36.7 C) 97.8 F (36.6 C)  SpO2: 99% 97%   Physical Exam: Cardiac:  RRR Lungs: Clear throughout on auscultation.  Slightly diminished in the bases. Incisions: Bilateral groin incisions with dressings clean dry and intact.  No hematoma no seroma to note. Extremities: Right lower extremity warm to touch. Palpable PT Pulse.  Abdomen: Positive bowel sounds, soft, flat, nontender, nondistended. Neurologic: Alert and oriented x 4.  Answers all questions appropriately.  Patient is completely neuro intact this morning.  CBC    Component Value Date/Time   WBC 5.8 05/29/2022 0510   RBC 4.19 (L) 05/29/2022 0510   HGB 13.1 05/29/2022 0510   HGB 17.5 07/18/2011 0934   HCT 38.6 (L) 05/29/2022 0510   HCT 53.1 (H) 07/18/2011 0934   PLT 194 05/29/2022 0510   PLT 267 07/18/2011 0934   MCV 92.1 05/29/2022 0510   MCV 98 07/18/2011 0934   MCH 31.3 05/29/2022 0510   MCHC 33.9 05/29/2022 0510   RDW 13.2 05/29/2022 0510   RDW 13.7 07/18/2011 0934   LYMPHSABS 0.8 05/27/2022 0640   MONOABS 0.5  05/27/2022 0640   EOSABS 0.0 05/27/2022 0640   BASOSABS 0.0 05/27/2022 0640    BMET    Component Value Date/Time   NA 135 05/29/2022 0510   NA 139 07/18/2011 0934   K 4.0 05/29/2022 0510   K 4.4 07/18/2011 0934   CL 105 05/29/2022 0510   CL 103 07/18/2011 0934   CO2 21 (L) 05/29/2022 0510   CO2 25 07/18/2011 0934   GLUCOSE 96 05/29/2022 0510   GLUCOSE 183 (H) 07/18/2011 0934   BUN 21 05/29/2022 0510   BUN 23 (H) 07/18/2011 0934   CREATININE 1.05 05/29/2022 0510   CREATININE 1.47 (H) 07/18/2011 0934   CALCIUM 9.4 05/29/2022 0510   CALCIUM 10.1 07/18/2011 0934   GFRNONAA >60 05/29/2022 0510   GFRNONAA 49 (L) 07/18/2011 0934   GFRAA >60 01/04/2018 1021   GFRAA 56 (L) 07/18/2011 0934    INR    Component Value Date/Time   INR 1.1 05/26/2022 1445     Intake/Output Summary (Last 24 hours) at 05/30/2022 1100 Last data filed at 05/30/2022 1046 Gross per 24 hour  Intake 1482.03 ml  Output 1525 ml  Net -42.97 ml     Assessment/Plan:  77 y.o. male is s/p chemical thrombectomy of the right lower extremity.  4 Days Post-Op   Plan: Patient will be taken back to the vascular lab for repeat diagnostic angiogram of the aorta and bilateral lower  extremities.  Dr. [name] MD 6 poke with the patient about the procedure yesterday on 05/29/2022.  I have spoken with the patient in detail this morning about the procedure, benefits, risks, and complications.  Patient verbalizes understanding.  Answered all patient's questions this morning.  Patient would like to proceed with the procedure tomorrow as planned.  Patient will be made n.p.o. after midnight.  Procedure will be done under general anesthesia.  This was set up with the operating room yesterday and they are aware.  Plan was discussed with Dr. [name] MD.  He is in agreement with the plan.  DVT prophylaxis: ASA 81 mg daily, Plavix 75 mg daily.   [facility] 05/30/2022 11:00 AM

## 2022-05-31 ENCOUNTER — Encounter
Admission: EM | Disposition: A | Discharge status: Home / Self Care | Payer: Self-pay | Source: Home / Self Care | Attending: Vascular Surgery

## 2022-05-31 ENCOUNTER — Inpatient Hospital Stay: Payer: Medicare PPO | Admitting: Certified Registered"

## 2022-05-31 DIAGNOSIS — Z95828 Presence of other vascular implants and grafts: Secondary | ICD-10-CM | POA: Diagnosis not present

## 2022-05-31 DIAGNOSIS — Z9889 Other specified postprocedural states: Secondary | ICD-10-CM

## 2022-05-31 DIAGNOSIS — I70221 Atherosclerosis of native arteries of extremities with rest pain, right leg: Secondary | ICD-10-CM | POA: Diagnosis not present

## 2022-05-31 HISTORY — PX: ABDOMINAL AORTOGRAM W/LOWER EXTREMITY: CATH118223

## 2022-05-31 LAB — SURGICAL PCR SCREEN
MRSA, PCR: NEGATIVE
Staphylococcus aureus: NEGATIVE

## 2022-05-31 SURGERY — ABDOMINAL AORTOGRAM W/LOWER EXTREMITY
Anesthesia: General | Laterality: Right

## 2022-05-31 MED ORDER — DEXMEDETOMIDINE HCL IN NACL 200 MCG/50ML IV SOLN
INTRAVENOUS | Status: DC | PRN
  Administered 2022-05-31: 8 ug via INTRAVENOUS

## 2022-05-31 MED ORDER — CEFAZOLIN SODIUM-DEXTROSE 2-4 GM/100ML-% IV SOLN
INTRAVENOUS | Status: AC
  Administered 2022-05-31: 2 g via INTRAVENOUS
  Filled 2022-05-31: qty 100

## 2022-05-31 MED ORDER — FENTANYL CITRATE (PF) 100 MCG/2ML IJ SOLN: 12.5000 ug | Freq: Once | INTRAMUSCULAR | Status: DC | PRN

## 2022-05-31 MED ORDER — GLYCOPYRROLATE 0.2 MG/ML IJ SOLN
INTRAMUSCULAR | Status: DC | PRN
  Administered 2022-05-31: .2 mg via INTRAVENOUS

## 2022-05-31 MED ORDER — IODIXANOL 320 MG/ML IV SOLN
INTRAVENOUS | Status: DC | PRN
  Administered 2022-05-31: 50 mL via INTRA_ARTERIAL

## 2022-05-31 MED ORDER — CEFAZOLIN SODIUM-DEXTROSE 2-4 GM/100ML-% IV SOLN: 2.0000 g | INTRAVENOUS | Status: AC

## 2022-05-31 MED ORDER — METHYLPREDNISOLONE SODIUM SUCC 125 MG IJ SOLR: 125.0000 mg | Freq: Once | INTRAMUSCULAR | Status: DC | PRN

## 2022-05-31 MED ORDER — DEXAMETHASONE SODIUM PHOSPHATE 10 MG/ML IJ SOLN
INTRAMUSCULAR | Status: DC | PRN
  Administered 2022-05-31: 5 mg via INTRAVENOUS

## 2022-05-31 MED ORDER — PROPOFOL 500 MG/50ML IV EMUL
INTRAVENOUS | Status: DC | PRN
  Administered 2022-05-31: 165 ug/kg/min via INTRAVENOUS

## 2022-05-31 MED ORDER — ONDANSETRON HCL 4 MG/2ML IJ SOLN
INTRAMUSCULAR | Status: DC | PRN
  Administered 2022-05-31: 4 mg via INTRAVENOUS

## 2022-05-31 MED ORDER — KETAMINE HCL 10 MG/ML IJ SOLN
INTRAMUSCULAR | Status: DC | PRN
  Administered 2022-05-31: 30 mg via INTRAVENOUS
  Administered 2022-05-31: 20 mg via INTRAVENOUS

## 2022-05-31 MED ORDER — HYDROMORPHONE HCL 1 MG/ML IJ SOLN: 1.0000 mg | Freq: Once | INTRAMUSCULAR | Status: DC | PRN

## 2022-05-31 MED ORDER — DIPHENHYDRAMINE HCL 50 MG/ML IJ SOLN: 50.0000 mg | Freq: Once | INTRAMUSCULAR | Status: DC | PRN

## 2022-05-31 MED ORDER — MIDAZOLAM HCL 2 MG/ML PO SYRP: 8.0000 mg | ORAL_SOLUTION | Freq: Once | ORAL | Status: DC | PRN

## 2022-05-31 MED ORDER — PROPOFOL 10 MG/ML IV BOLUS
INTRAVENOUS | Status: DC | PRN
  Administered 2022-05-31: 60 mg via INTRAVENOUS

## 2022-05-31 MED ORDER — FAMOTIDINE 20 MG PO TABS: 40.0000 mg | ORAL_TABLET | Freq: Once | ORAL | Status: DC | PRN

## 2022-05-31 MED ORDER — MIDAZOLAM HCL 2 MG/2ML IJ SOLN
INTRAMUSCULAR | Status: AC
  Filled 2022-05-31: qty 2

## 2022-05-31 MED ORDER — KETAMINE HCL 50 MG/5ML IJ SOSY
PREFILLED_SYRINGE | INTRAMUSCULAR | Status: AC
  Filled 2022-05-31: qty 5

## 2022-05-31 MED ORDER — LIDOCAINE HCL (CARDIAC) PF 100 MG/5ML IV SOSY
PREFILLED_SYRINGE | INTRAVENOUS | Status: DC | PRN
  Administered 2022-05-31: 60 mg via INTRAVENOUS

## 2022-05-31 MED ORDER — MIDAZOLAM HCL 2 MG/2ML IJ SOLN
INTRAMUSCULAR | Status: DC | PRN
  Administered 2022-05-31: 2 mg via INTRAVENOUS

## 2022-05-31 MED ORDER — ONDANSETRON HCL 4 MG/2ML IJ SOLN: 4.0000 mg | Freq: Four times a day (QID) | INTRAMUSCULAR | Status: DC | PRN

## 2022-05-31 MED ORDER — FENTANYL CITRATE (PF) 100 MCG/2ML IJ SOLN
INTRAMUSCULAR | Status: AC
  Filled 2022-05-31: qty 2

## 2022-05-31 SURGICAL SUPPLY — 9 items
CATH ANGIO 5F PIGTAIL 65CM (CATHETERS) IMPLANT
COVER PROBE ULTRASOUND 5X96 (MISCELLANEOUS) IMPLANT
DEVICE STARCLOSE SE CLOSURE (Vascular Products) IMPLANT
PACK ANGIOGRAPHY (CUSTOM PROCEDURE TRAY) IMPLANT
SHEATH BRITE TIP 5FRX11 (SHEATH) IMPLANT
SYR MEDRAD MARK 7 150ML (SYRINGE) IMPLANT
TUBING CONTRAST HIGH PRESS 72 (TUBING) IMPLANT
WIRE G V18X300CM (WIRE) IMPLANT
WIRE GUIDERIGHT .035X150 (WIRE) IMPLANT

## 2022-05-31 NOTE — Interval H&P Note (Signed)
History and Physical Interval Note:  05/31/2022 11:48 AM  Jerry [name]  has presented today for surgery, with the diagnosis of RLE CLI.  The various methods of treatment have been discussed with the patient and family. After consideration of risks, benefits and other options for treatment, the patient has consented to  Procedure(s): ABDOMINAL AORTOGRAM W/LOWER EXTREMITY (Right) as a surgical intervention.  The patient's history has been reviewed, patient examined, no change in status, stable for surgery.  I have reviewed the patient's chart and labs.  Questions were answered to the patient's satisfaction.     [name]

## 2022-05-31 NOTE — Transfer of Care (Signed)
Immediate Anesthesia Transfer of Care Note  Patient: Jerry [name]  Procedure(s) Performed: ABDOMINAL AORTOGRAM W/LOWER EXTREMITY (Right)  Patient Location:  [facility]   Anesthesia Type:General  Level of Consciousness: drowsy and patient cooperative  Airway & Oxygen Therapy: Patient Spontanous Breathing and Patient connected to face mask oxygen  Post-op Assessment: Report given to RN and Post -op Vital signs reviewed and stable  Post vital signs: Reviewed and stable  Last Vitals:  Vitals Value Taken Time  BP    Temp    Pulse    Resp    SpO2      Last Pain:  Vitals:   05/31/22 1135  TempSrc: Oral  PainSc: 0-No pain      Patients Stated Pain Goal: 0 ([id number])  Complications: No notable events documented.

## 2022-05-31 NOTE — Anesthesia Preprocedure Evaluation (Addendum)
Anesthesia Evaluation  Patient identified by MRN, date of birth, ID band Patient awake    Reviewed: Allergy & Precautions, NPO status , Patient's Chart, lab work & pertinent test results  History of Anesthesia Complications Negative for: history of anesthetic complications  Airway Mallampati: III  TM Distance: >3 FB Neck ROM: full    Dental  (+) Upper Dentures   Pulmonary Current Smoker   Pulmonary exam normal        Cardiovascular + Peripheral Vascular Disease  Normal cardiovascular exam     Neuro/Psych negative neurological ROS  negative psych ROS   GI/Hepatic negative GI ROS, Neg liver ROS,,,  Endo/Other  negative endocrine ROS    Renal/GU      Musculoskeletal   Abdominal   Peds  Hematology  (+) Blood dyscrasia, anemia Hx of dvt    Anesthesia Other Findings Past Medical History: No date: Abdominal wall mass of right lower quadrant No date: Abnormal finding on EKG No date: Anemia     Comment:  patient unaware of this diagnosis No date: 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) No date: Cigarette smoker No date: Hearing loss No date: Hyperlipidemia No date: Leg DVT (deep venous thromboembolism), acute, left (HCC) No date: Peripheral vascular disease (HCC)     Comment:  blockages in both extremities No date: Vitamin D deficiency     Comment:  patient unaware of this diagnosis No date: Wears dentures     Comment:  Full upper, Partial lower  Past Surgical History: 10/03/2017: ANGIOPLASTY ILLIAC ARTERY; Left     Comment:  Procedure: ANGIOPLASTY ILIAC ARTERY;  Surgeon: Algernon Huxley, MD;  Location: [facility];  Service: Vascular;                Laterality: Left; 1990: BACK SURGERY     Comment:  Three sx in lower back. metal in lower back 08/17/2021: CATARACT EXTRACTION W/PHACO; Left     Comment:  Procedure: CATARACT EXTRACTION PHACO AND INTRAOCULAR               LENS PLACEMENT  (IOC) LEFT 3.48 00:30.9;  Surgeon: Eulogio Bear, MD;  Location: [place];                Service: Ophthalmology;  Laterality: Left; 09/12/2021: CATARACT EXTRACTION W/PHACO; Right     Comment:  Procedure: CATARACT EXTRACTION PHACO AND INTRAOCULAR               LENS PLACEMENT (IOC) RIGHT 5.74 00:42.3;  Surgeon: Eulogio Bear, MD;  Location: [place];                Service: Ophthalmology;  Laterality: Right; 01/2010: COLONOSCOPY 10/03/2017: ENDARTERECTOMY FEMORAL; Left     Comment:  Procedure: ENDARTERECTOMY FEMORAL;  Surgeon: Algernon Huxley, MD;  Location: [facility];  Service: Vascular;                Laterality: Left; 2009: Left Leg stent; Left     Comment:  fem fem bypass 09/19/2017: LOWER EXTREMITY ANGIOGRAPHY; Left     Comment:  Procedure: LOWER EXTREMITY ANGIOGRAPHY;  Surgeon: Lucky Cowboy,  Erskine Squibb, MD;  Location: [place] [facility];  Service:               Cardiovascular;  Laterality: Left; 01/07/2018: LOWER EXTREMITY ANGIOGRAPHY; Right     Comment:  Procedure: LOWER EXTREMITY ANGIOGRAPHY;  Surgeon: Algernon Huxley, MD;  Location: [place] [facility];  Service:               Cardiovascular;  Laterality: Right; 05/26/2022: LOWER EXTREMITY ANGIOGRAPHY; Right     Comment:  Procedure: Lower Extremity Angiography;  Surgeon:               Katha Cabal, MD;  Location: [place] [facility];               Service: Cardiovascular;  Laterality: Right; 08/2017: PTCA; Right 2009: VASCULAR SURGERY; Left     Comment:  fem fem bypass      Reproductive/Obstetrics negative OB ROS                             Anesthesia Physical Anesthesia Plan  ASA: 3  Anesthesia Plan: General   Post-op Pain Management: Ofirmev IV (intra-op)*   Induction: Intravenous  PONV Risk Score and Plan: 1 and Ondansetron, Dexamethasone and Treatment may vary due to age or medical  condition  Airway Management Planned: Natural Airway and Nasal Cannula  Additional Equipment:   Intra-op Plan:   Post-operative Plan:   Informed Consent: I have reviewed the patients History and Physical, chart, labs and discussed the procedure including the risks, benefits and alternatives for the proposed anesthesia with the patient or authorized representative who has indicated his/her understanding and acceptance.     Dental Advisory Given  Plan Discussed with: Anesthesiologist, CRNA and Surgeon  Anesthesia Plan Comments: (Patient consented for risks of anesthesia including but not limited to:  - adverse reactions to medications - damage to eyes, teeth, lips or other oral mucosa - nerve damage due to positioning  - sore throat or hoarseness - Damage to heart, brain, nerves, lungs, other parts of body or loss of life  Patient voiced understanding.)        Anesthesia Quick Evaluation

## 2022-05-31 NOTE — Anesthesia Procedure Notes (Signed)
Procedure Name: General with mask airway Date/Time: 05/31/2022 2:02 PM  Performed by: Kelton Pillar, CRNAPre-anesthesia Checklist: Patient identified, Emergency Drugs available, Suction available and Patient being monitored Patient Re-evaluated:Patient Re-evaluated prior to induction Oxygen Delivery Method: Simple face mask Induction Type: IV induction Ventilation: Oral airway inserted - appropriate to patient size Placement Confirmation: positive ETCO2, CO2 detector and breath sounds checked- equal and bilateral Dental Injury: Teeth and Oropharynx as per pre-operative assessment

## 2022-05-31 NOTE — Op Note (Signed)
[facility]  Percutaneous Study/Intervention Procedural Note   Date of Surgery: 05/31/2022  Surgeon(s):Loletta Harper    Assistants:none  Pre-operative Diagnosis: PAD with rest [name], s/p right lower extremity intervention with suboptimal imaging due to patient motion  Post-operative diagnosis:  Same  Procedure(s) Performed:             1.  Ultrasound guidance for vascular access left femoral artery             2.  Catheter placement into aorta from left femoral approach             3.  Aortogram and selective right lower extremity angiogram             4.  StarClose closure device left femoral artery  EBL: 5 cc  Contrast: 50 cc  Fluoro Time: 0.8 minutes  Anesthesia: MAC              Indications:  Patient is a 77 y.o.male with right lower extremity rest [name] symptoms.  He underwent urgent revascularization several days ago, but due to his noncompliance and patient motion the image quality was extremely poor.  A CT scan of been done but has not been particularly helpful since his symptoms had markedly improved but the CT seem to demonstrate multiple areas of severe disease.  An angiogram is appropriate for further evaluation and planning any interventions versus medical management.  Risks and benefits are discussed and informed consent is obtained.   Procedure:  The patient was identified and appropriate procedural time out was performed.  The patient was then placed supine on the table and prepped and draped in the usual sterile fashion. Moderate conscious sedation was administered during a face to face encounter with the patient throughout the procedure with my supervision of the RN administering medicines and monitoring the patient's vital signs, pulse oximetry, telemetry and mental status throughout from the start of the procedure until the patient was taken to the recovery room. Ultrasound was used to evaluate the left common femoral artery.  It was patent .  A  digital ultrasound image was acquired.  A Seldinger needle was used to access the left common femoral artery under direct ultrasound guidance and a permanent image was performed.  A 0.035 J wire was advanced without resistance and a 5Fr sheath was placed.  Pigtail catheter was placed into the aorta and an aortogram was performed.  Pelvic obliques were also performed which were necessary in part to further evaluate the iliacs especially with his spinal hardware.  This demonstrated normal renal arteries and normal aorta and iliac segments without significant stenosis after previous stent placement bilaterally with the stents being widely patent. I then elected to image the right leg with the pigtail catheter in the aorta due to the stents up into the aorta that would preclude up and over intervention. This demonstrated no significant stenosis in the common femoral artery, profunda femoris artery, or superficial femoral artery down to the distal segment.  There was disease in the popliteal artery that appeared to be in the 80 to 90% range.  There is a typical tibial trifurcation with continuous anterior tibial and posterior tibial arteries distally without focal stenosis.  With this finding, if he has worsening symptoms we can perform either an antegrade approach from the right femoral or a pedal access to address the popliteal stenosis but he clearly does not have any immediate limb threatening ischemia.  I elected to terminate the procedure.  The sheath was removed and StarClose closure device was deployed in the left femoral artery with excellent hemostatic result. The patient was taken to the recovery room in stable condition having tolerated the procedure well.  Findings:               Aortogram:  This demonstrated normal renal arteries and normal aorta and iliac segments without significant stenosis after previous stent placement bilaterally with the stents being widely patent.             Right Lower  Extremity:  This demonstrated no significant stenosis in the common femoral artery, profunda femoris artery, or superficial femoral artery down to the distal segment.  There was disease in the popliteal artery that appeared to be in the 80 to 90% range.  There is a typical tibial trifurcation with continuous anterior tibial and posterior tibial arteries distally without focal stenosis.   Disposition: Patient was taken to the recovery room in stable condition having tolerated the procedure well.  Complications: None  Jerry [name] 05/31/2022 2:14 PM   This note was created with Dragon Medical transcription system. Any errors in dictation are purely unintentional.

## 2022-06-01 ENCOUNTER — Encounter (INDEPENDENT_AMBULATORY_CARE_PROVIDER_SITE_OTHER): Payer: Self-pay

## 2022-06-01 ENCOUNTER — Encounter: Payer: Self-pay | Admitting: Vascular Surgery

## 2022-06-01 MED ORDER — ASPIRIN 81 MG PO TBEC: 81.0000 mg | DELAYED_RELEASE_TABLET | Freq: Every day | ORAL | 12 refills | Status: AC

## 2022-06-01 MED ORDER — APIXABAN 5 MG PO TABS: 5.0000 mg | ORAL_TABLET | Freq: Two times a day (BID) | ORAL | 6 refills | Status: DC

## 2022-06-01 MED ORDER — ATORVASTATIN CALCIUM 10 MG PO TABS: 10.0000 mg | ORAL_TABLET | Freq: Every day | ORAL | 6 refills | Status: DC

## 2022-06-01 MED ORDER — QUETIAPINE FUMARATE 50 MG PO TABS: 50.0000 mg | ORAL_TABLET | Freq: Every day | ORAL | 6 refills | Status: AC

## 2022-06-01 NOTE — TOC Transition Note (Signed)
Transition of Care [facility]) - CM/SW Discharge Note   Patient Details  Name: Jerry [name] MRN: [id number] Date of Birth: 10/31/1945  Transition of [facility]) CM/SW Contact:  Candie Chroman, LCSW Phone Number: 06/01/2022, 8:38 AM   Clinical Narrative:  Patient has orders to discharge home today. No further concerns. CSW signing off.   Final next level of care: Home/Self Care Barriers to Discharge: Barriers Resolved   Patient Goals and CMS Choice      Discharge Placement                  Patient to be transferred to facility by: Wife   Patient and family notified of of transfer: 06/01/22  Discharge Plan and Services Additional resources added to the After Visit Summary for       Post Acute Care Choice: NA                               Social Determinants of Health (SDOH) Interventions SDOH Screenings   Food Insecurity: No Food Insecurity (05/31/2022)  Recent Concern: [place] Present (04/17/2022)  Housing: Low Risk  (05/31/2022)  Recent Concern: Housing - Medium Risk (04/17/2022)  Transportation Needs: No Transportation Needs (05/31/2022)  Utilities: Not At Risk (05/31/2022)  Depression (PHQ2-9): Low Risk  (04/17/2022)  Financial Resource Strain: Medium Risk (04/17/2022)  Tobacco Use: High Risk (06/01/2022)     Readmission Risk Interventions     No data to display

## 2022-06-01 NOTE — Progress Notes (Signed)
Pt discharged per MD order. IV removed. Discharge instructions reviewed with pt and his wife. Pt and wife verbalized understanding. All questions answered to satisfaction. Work note given to pt. Pt taken to car in wheelchair by volunteer.

## 2022-06-01 NOTE — Anesthesia Postprocedure Evaluation (Signed)
Anesthesia Post Note  Patient: Jerry [name]  Procedure(s) Performed: ABDOMINAL AORTOGRAM W/LOWER EXTREMITY (Right)  Patient location during evaluation: Specials Recovery Anesthesia Type: General Level of consciousness: awake and alert Pain management: pain level controlled Vital Signs Assessment: post-procedure vital signs reviewed and stable Respiratory status: spontaneous breathing, nonlabored ventilation, respiratory function stable and patient connected to nasal cannula oxygen Cardiovascular status: blood pressure returned to baseline and stable Postop Assessment: no apparent nausea or vomiting Anesthetic complications: no   No notable events documented.   Last Vitals:  Vitals:   06/01/22 0554 06/01/22 0743  BP: (!) 149/65 139/72  Pulse: (!) 59 (!) 56  Resp: 16 18  Temp: 36.5 C 36.5 C  SpO2: 98% 98%    Last Pain:  Vitals:   06/01/22 0743  TempSrc: Oral  PainSc:                  Ilene Qua

## 2022-06-01 NOTE — Discharge Summary (Signed)
[facility]    Discharge Summary    Patient ID:  Jerry [name] MRN: [id number] DOB/AGE: 79-Jul-1947 79 y.o.  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 [facility]) [I70.621] Acute lower limb ischemia [I99.8] Atherosclerosis of artery of extremity with rest pain [facility]) [I70.229]  Discharge Diagnoses:  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 ([place]) [I70.621] Acute lower limb ischemia [I99.8] Atherosclerosis of artery of extremity with rest pain ([place]) [I70.229]  Secondary Diagnoses: Past Medical History:  Diagnosis Date   Abdominal wall mass of right lower quadrant    Abnormal finding on EKG    Anemia    patient unaware of this diagnosis   Atherosclerosis of lower extremity with claudication ([place])    Cigarette smoker    Hearing loss    Hyperlipidemia    Leg DVT (deep venous thromboembolism), acute, left ([facility])    Peripheral vascular disease ([facility])    blockages in both extremities   Vitamin D deficiency    patient unaware of this diagnosis   Wears dentures    Full upper, Partial lower    Procedure(s): ABDOMINAL AORTOGRAM W/LOWER EXTREMITY  Discharged Condition: good  HPI:  Jerry [name] is a 77 year old male who is now status post day 1 from an aortogram with selective right lower extremity angiogram.  Ultrasound guidance he was accessed via his left femoral artery.  Left groin has a dressing that is clean dry and intact.  Angiogram was to observe blood flow to the right lower extremity.  Right lower extremity is warm to touch and has palpable PT pulses.  Resting pain has gone away.  Patient has no other complaints.  Vitals all remained stable.  Patient will be discharged on aspirin 81 mg daily, and Eliquis 5 mg twice a day.  Discharge instructions  were discussed with the patient.  He verbalizes understanding.  Patient will be discharged today.  Hospital Course:  Jerry [name] is a 77 y.o. male is S/P Bilateral Extubated: POD # 0 Physical Exam:  Alert notes x3, no acute distress Face: Symmetrical.  Tongue is midline. Neck: Trachea is midline.  No swelling or bruising. Cardiovascular: Regular rate and rhythm Pulmonary: Clear to auscultation bilaterally Abdomen: Soft, nontender, nondistended Right groin access: Clean dry and intact.  No swelling or drainage noted Left groin access: Clean dry and intact.  No swelling or drainage noted Left lower extremity: Thigh soft.  Calf soft.  Extremities warm distally toes.  Hard to palpate pedal pulses however the foot is warm is her good capillary refill. Right lower extremity: Thigh soft.  Calf soft.  Extremities warm distally toes.  Hard to palpate pedal pulses however the foot is warm is her good capillary refill. Neurological: No deficits noted   Post-op wounds:  clean, dry, intact or healing well  Pt. Ambulating, voiding and taking PO diet without difficulty. Pt pain controlled with PO pain meds.  Labs:  As below  Complications: none  Consults:    Significant Diagnostic Studies: CBC Lab Results  Component Value Date   WBC 5.8 05/29/2022   HGB 13.1 05/29/2022   HCT 38.6 (L) 05/29/2022   MCV 92.1 05/29/2022   PLT 194 05/29/2022    BMET    Component Value Date/Time   NA 135 05/29/2022 0510   NA 139 07/18/2011 0934   K  4.0 05/29/2022 0510   K 4.4 07/18/2011 0934   CL 105 05/29/2022 0510   CL 103 07/18/2011 0934   CO2 21 (L) 05/29/2022 0510   CO2 25 07/18/2011 0934   GLUCOSE 96 05/29/2022 0510   GLUCOSE 183 (H) 07/18/2011 0934   BUN 21 05/29/2022 0510   BUN 23 (H) 07/18/2011 0934   CREATININE 1.05 05/29/2022 0510   CREATININE 1.47 (H) 07/18/2011 0934   CALCIUM 9.4 05/29/2022 0510   CALCIUM 10.1 07/18/2011 0934   GFRNONAA >60 05/29/2022 0510   GFRNONAA 49 (L)  07/18/2011 0934   GFRAA >60 01/04/2018 1021   GFRAA 56 (L) 07/18/2011 0934   COAG Lab Results  Component Value Date   INR 1.1 05/26/2022   INR 0.89 09/25/2017     Disposition:  Discharge to :Home  Allergies as of 06/01/2022   No Known Allergies      Medication List     TAKE these medications    apixaban 5 MG Tabs tablet Commonly known as: ELIQUIS Take 1 tablet (5 mg total) by mouth 2 (two) times daily.   aspirin EC 81 MG tablet Take 1 tablet (81 mg total) by mouth daily. Swallow whole.   atorvastatin 10 MG tablet Commonly known as: LIPITOR Take 1 tablet (10 mg total) by mouth daily at 6 PM.   QUEtiapine 50 MG tablet Commonly known as: SEROQUEL Take 1 tablet (50 mg total) by mouth at bedtime.       Verbal and written Discharge instructions given to the patient. Wound care per Discharge AVS  Follow-up Information     Dew, Erskine Squibb, MD Follow up in 1 month(s).   Specialties: Vascular Surgery, Radiology, Interventional Cardiology Why: Follow up with Arna Medici with bilat lower extremity Duplex ultrasounds with [facility] Contact information: [place] [address] [phone]                 Signed: Drema Pry, NP  06/01/2022, 7:37 AM

## 2022-06-20 ENCOUNTER — Telehealth (INDEPENDENT_AMBULATORY_CARE_PROVIDER_SITE_OTHER): Payer: Self-pay

## 2022-06-20 NOTE — Telephone Encounter (Signed)
Return call to patient and left a message on voicemail

## 2022-06-23 ENCOUNTER — Other Ambulatory Visit (INDEPENDENT_AMBULATORY_CARE_PROVIDER_SITE_OTHER): Payer: Self-pay | Admitting: Vascular Surgery

## 2022-06-27 ENCOUNTER — Other Ambulatory Visit (INDEPENDENT_AMBULATORY_CARE_PROVIDER_SITE_OTHER): Payer: Self-pay | Admitting: Vascular Surgery

## 2022-06-27 DIAGNOSIS — I739 Peripheral vascular disease, unspecified: Secondary | ICD-10-CM

## 2022-06-27 NOTE — Telephone Encounter (Signed)
Patient needs to Follow up with his PCP Dr. Delight Stare

## 2022-06-30 ENCOUNTER — Telehealth (INDEPENDENT_AMBULATORY_CARE_PROVIDER_SITE_OTHER): Payer: Self-pay | Admitting: Nurse Practitioner

## 2022-06-30 NOTE — Telephone Encounter (Signed)
Patient wife called in stating that they could not afford patients medication wanting to know if we can send in something else    Please call and advise  Patient has enough to last them through the weekend   apixaban (ELIQUIS) 5 MG TABS tablet

## 2022-06-30 NOTE — Telephone Encounter (Signed)
Called patient, after to talking to [place]. Patient will be in Monday to pick up samples to last him until 06/04/22 for his appt. He will discuss his options with Vivia Birmingham then.

## 2022-07-03 NOTE — Telephone Encounter (Signed)
Patient has picked up medication 

## 2022-07-05 ENCOUNTER — Ambulatory Visit (INDEPENDENT_AMBULATORY_CARE_PROVIDER_SITE_OTHER): Payer: Medicare PPO | Admitting: Nurse Practitioner

## 2022-07-05 ENCOUNTER — Ambulatory Visit (INDEPENDENT_AMBULATORY_CARE_PROVIDER_SITE_OTHER): Payer: Medicare PPO

## 2022-07-05 ENCOUNTER — Encounter (INDEPENDENT_AMBULATORY_CARE_PROVIDER_SITE_OTHER): Payer: Self-pay | Admitting: Nurse Practitioner

## 2022-07-05 VITALS — BP 140/71 | HR 50 | Resp 16 | Wt 132.8 lb

## 2022-07-05 DIAGNOSIS — I70219 Atherosclerosis of native arteries of extremities with intermittent claudication, unspecified extremity: Secondary | ICD-10-CM

## 2022-07-05 DIAGNOSIS — I739 Peripheral vascular disease, unspecified: Secondary | ICD-10-CM

## 2022-07-05 DIAGNOSIS — F17200 Nicotine dependence, unspecified, uncomplicated: Secondary | ICD-10-CM | POA: Diagnosis not present

## 2022-07-05 DIAGNOSIS — Z9889 Other specified postprocedural states: Secondary | ICD-10-CM

## 2022-07-05 DIAGNOSIS — E785 Hyperlipidemia, unspecified: Secondary | ICD-10-CM

## 2022-07-05 NOTE — Progress Notes (Signed)
Subjective:    Patient ID: Jerry [name], male    DOB: 1945/12/16, 77 y.o.   MRN: [id number] Chief Complaint  Patient presents with   Follow-up    [facility] 1 month follow up    The patient returns to the office for followup and review status post angiogram with intervention on 05/26/2022.   Procedure: Procedure(s) Performed:             1.  Abdominal aortogram             2.  Introduction catheter into aorta bilateral femoral approach              3.  Mechanical thrombectomy of the right external iliac artery and right common iliac artery             4.  Angioplasty of the right external iliac artery to 8 mm             5.  Angioplasty of the right common iliac artery to 8 mm             6.  Angioplasty of the right common femoral to 7 mm with a Lutonix drug-eluting balloon             7.  [employer] left common femoral manual pressure held on the right common femoral  Additional angiogram done on 05/31/2022 however no intervention was done at that time as occlusion was unable to be crossed, but it was noted 75 to 99% stenosis of the popliteal artery which would likely require a additional angiogram with pedal approach   The patient notes improvement in the lower extremity symptoms. No interval shortening of the patient's claudication distance or rest pain symptoms. No new ulcers or wounds have occurred since the last visit.  His largest complaint is continuing numbness of the right lower extremity.  But he specifically denies having rest pain like symptoms.  He is largely concerned about the cost of Eliquis currently.  There have been no significant changes to the patient's overall health care.  No documented history of amaurosis fugax or recent TIA symptoms. There are no recent neurological changes noted. No documented history of DVT, PE or superficial thrombophlebitis. The patient denies recent episodes of angina or shortness of breath.   ABI's Rt=1.05 and Lt=1.04  (previous ABI's  Rt=0.00 and Lt=1.02) Duplex US of the right lower extremity shows a approximate 75 to 99% stenosis in the SFA/popliteal artery which is consistent with the angiogram that was done on 05/31/2022.  Additional aortoiliac duplex was noted which showed no significant stenosis of the common iliac artery.  No evidence of an abdominal aortic aneurysm.    Review of Systems  Neurological:  Positive for numbness.  All other systems reviewed and are negative.      Objective:   Physical Exam Vitals reviewed.  HENT:     Head: Normocephalic.  Cardiovascular:     Rate and Rhythm: Normal rate.     Pulses:          Dorsalis pedis pulses are detected w/ Doppler on the right side and detected w/ Doppler on the left side.       Posterior tibial pulses are detected w/ Doppler on the right side and detected w/ Doppler on the left side.  Pulmonary:     Effort: Pulmonary effort is normal.  Skin:    General: Skin is warm and dry.  Neurological:     Mental Status: He is alert  and oriented to person, place, and time.  Psychiatric:        Mood and Affect: Mood normal.        Behavior: Behavior normal.        Thought Content: Thought content normal.        Judgment: Judgment normal.     BP (!) 140/71   Pulse (!) 50   Resp 16   Wt 132 lb 12.8 oz (60.2 kg)   BMI 22.10 kg/m   Past Medical History:  Diagnosis Date   Abdominal wall mass of right lower quadrant    Abnormal finding on EKG    Anemia    patient unaware of this diagnosis   Atherosclerosis of lower extremity with claudication    Cigarette smoker    Hearing loss    Hyperlipidemia    Leg DVT (deep venous thromboembolism), acute, left    Peripheral vascular disease    blockages in both extremities   Vitamin D deficiency    patient unaware of this diagnosis   Wears dentures    Full upper, Partial lower    Social History   Socioeconomic History   Marital status: Married    Spouse name: Not on file   Number of children: 6   Years  of education: Not on file   Highest education level: Not on file  Occupational History   Not on file  Tobacco Use   Smoking status: Every Day    Packs/day: 0.50    Years: 40.00    Additional pack years: 0.00    Total pack years: 20.00    Types: Cigarettes   Smokeless tobacco: Never   Tobacco comments:       Vaping Use   Vaping Use: Never used  Substance and Sexual Activity   Alcohol use: Never   Drug use: Never   Sexual activity: Not Currently  Other Topics Concern   Not on file  Social History Narrative   Not on file   Social Determinants of Health   Financial Resource Strain: Medium Risk (04/17/2022)   Overall Financial Resource Strain (CARDIA)    Difficulty of Paying Living Expenses: Somewhat hard  Food Insecurity: No Food Insecurity (05/31/2022)   Hunger Vital Sign    Worried About Running Out of Food in the Last Year: Never true    Ran Out of Food in the Last Year: Never true  Recent Concern: Food Insecurity - Food Insecurity Present (04/17/2022)   Hunger Vital Sign    Worried About Running Out of Food in the Last Year: Sometimes true    Ran Out of Food in the Last Year: Sometimes true  Transportation Needs: No Transportation Needs (05/31/2022)   PRAPARE - Administrator, Civil ServiceTransportation    Lack of Transportation (Medical): No    Lack of Transportation (Non-Medical): No  Physical Activity: Not on file  Stress: Not on file  Social Connections: Not on file  Intimate Partner Violence: Not At Risk (05/31/2022)   Humiliation, Afraid, Rape, and Kick questionnaire    Fear of Current or Ex-Partner: No    Emotionally Abused: No    Physically Abused: No    Sexually Abused: No    Past Surgical History:  Procedure Laterality Date   ABDOMINAL AORTOGRAM W/LOWER EXTREMITY Right 05/31/2022   Procedure: ABDOMINAL AORTOGRAM W/LOWER EXTREMITY;  Surgeon: Annice Needyew, Jason S, MD;  Location: [facility];  Service: Cardiovascular;  Laterality: Right;   ANGIOPLASTY ILLIAC ARTERY Left 10/03/2017    Procedure: ANGIOPLASTY ILIAC ARTERY;  Surgeon: Annice Needy, MD;  Location: [facility];  Service: Vascular;  Laterality: Left;   BACK SURGERY  1990   Three sx in lower back. metal in lower back   CATARACT EXTRACTION W/PHACO Left 08/17/2021   Procedure: CATARACT EXTRACTION PHACO AND INTRAOCULAR LENS PLACEMENT (IOC) LEFT 3.48 00:30.9;  Surgeon: Nevada Crane, MD;  Location: [facility];  Service: Ophthalmology;  Laterality: Left;   CATARACT EXTRACTION W/PHACO Right 09/12/2021   Procedure: CATARACT EXTRACTION PHACO AND INTRAOCULAR LENS PLACEMENT (IOC) RIGHT 5.74 00:42.3;  Surgeon: Nevada Crane, MD;  Location: [facility];  Service: Ophthalmology;  Laterality: Right;   COLONOSCOPY  01/2010   ENDARTERECTOMY FEMORAL Left 10/03/2017   Procedure: ENDARTERECTOMY FEMORAL;  Surgeon: Annice Needy, MD;  Location: [facility];  Service: Vascular;  Laterality: Left;   Left Leg stent Left 2009   fem fem bypass   LOWER EXTREMITY ANGIOGRAPHY Left 09/19/2017   Procedure: LOWER EXTREMITY ANGIOGRAPHY;  Surgeon: Annice Needy, MD;  Location: [facility];  Service: Cardiovascular;  Laterality: Left;   LOWER EXTREMITY ANGIOGRAPHY Right 01/07/2018   Procedure: LOWER EXTREMITY ANGIOGRAPHY;  Surgeon: Annice Needy, MD;  Location: [facility];  Service: Cardiovascular;  Laterality: Right;   LOWER EXTREMITY ANGIOGRAPHY Right 05/26/2022   Procedure: Lower Extremity Angiography;  Surgeon: Renford Dills, MD;  Location: [facility];  Service: Cardiovascular;  Laterality: Right;   PTCA Right 08/2017   VASCULAR SURGERY Left 2009   fem fem bypass     Family History  Problem Relation Age of Onset   Colon cancer Neg Hx     No Known Allergies     Latest Ref Rng & Units 05/29/2022    5:10 AM 05/28/2022    4:17 AM 05/27/2022    6:40 AM  CBC  WBC 4.0 - 10.5 K/uL 5.8  6.4  5.7   Hemoglobin 13.0 - 17.0 g/dL 54.0  98.1  19.1   Hematocrit 39.0 - 52.0 % 38.6  36.7  39.0   Platelets  150 - 400 K/uL 194  183  180       CMP     Component Value Date/Time   NA 135 05/29/2022 0510   NA 139 07/18/2011 0934   K 4.0 05/29/2022 0510   K 4.4 07/18/2011 0934   CL 105 05/29/2022 0510   CL 103 07/18/2011 0934   CO2 21 (L) 05/29/2022 0510   CO2 25 07/18/2011 0934   GLUCOSE 96 05/29/2022 0510   GLUCOSE 183 (H) 07/18/2011 0934   BUN 21 05/29/2022 0510   BUN 23 (H) 07/18/2011 0934   CREATININE 1.05 05/29/2022 0510   CREATININE 1.47 (H) 07/18/2011 0934   CALCIUM 9.4 05/29/2022 0510   CALCIUM 10.1 07/18/2011 0934   PROT 7.8 04/17/2022 0952   PROT 9.1 (H) 07/18/2011 0934   ALBUMIN 4.5 04/17/2022 0952   ALBUMIN 5.1 (H) 07/18/2011 0934   AST 19 04/17/2022 0952   AST 27 07/18/2011 0934   ALT 9 04/17/2022 0952   ALT 19 07/18/2011 0934   ALKPHOS 46 04/17/2022 0952   ALKPHOS 66 07/18/2011 0934   BILITOT 1.1 04/17/2022 0952   BILITOT 1.2 (H) 07/18/2011 0934   GFRNONAA >60 05/29/2022 0510   GFRNONAA 49 (L) 07/18/2011 0934   GFRAA >60 01/04/2018 1021   GFRAA 56 (L) 07/18/2011 0934     VAS [place] ABI WITH/WO TBI  Result Date: 05/29/2022  LOWER EXTREMITY DOPPLER STUDY Patient Name:  Jerry [name]  Date of Exam:   05/26/2022 Medical Rec #: [id number]     Accession #:    [id number] Date of Birth: Jul 30, 1945    Patient Gender: M Patient Age:   68 years Exam Location:  [facility] Procedure:      VAS [place] ABI WITH/WO TBI Referring Phys: Sheppard Plumber --------------------------------------------------------------------------------  Indications: Claudication, rest pain, and peripheral artery disease. High Risk Factors: Hyperlipidemia, current smoker. Other Factors: 5 days of right foot pain and numbness.  Vascular Interventions: History of fem-fem BPG with subsequent occlusion per                         previous duplex & angiogram;                         09/19/17: Right EIA angioplasty;                         10/03/17: Bilateral proximal CIA kissing stents with                          additional bilateral CIA & left EIA stents;. Performing Technologist: Hardie Lora RVT  Examination Guidelines: A complete evaluation includes at minimum, Doppler waveform signals and systolic blood pressure reading at the level of bilateral brachial, anterior tibial, and posterior tibial arteries, when vessel segments are accessible. Bilateral testing is considered an integral part of a complete examination. Photoelectric Plethysmograph (PPG) waveforms and toe systolic pressure readings are included as required and additional duplex testing as needed. Limited examinations for reoccurring indications may be performed as noted.  ABI Findings: +---------+------------------+-----+--------+--------+ Right    Rt Pressure (mmHg)IndexWaveformComment  +---------+------------------+-----+--------+--------+ Brachial 120                                     +---------+------------------+-----+--------+--------+ PTA      0                 0.00 absent           +---------+------------------+-----+--------+--------+ PERO                            absent           +---------+------------------+-----+--------+--------+ DP       0                 0.00 absent           +---------+------------------+-----+--------+--------+ Great Toe0                 0.00                  +---------+------------------+-----+--------+--------+ +---------+------------------+-----+----------+-------+ Left     Lt Pressure (mmHg)IndexWaveform  Comment +---------+------------------+-----+----------+-------+ Brachial 123                                      +---------+------------------+-----+----------+-------+ PTA      125               1.02 triphasic         +---------+------------------+-----+----------+-------+ DP       105  0.85 monophasic        +---------+------------------+-----+----------+-------+ Great Toe93                0.76                    +---------+------------------+-----+----------+-------+ +-------+-----------+-----------+------------+------------+ ABI/TBIToday's ABIToday's TBIPrevious ABIPrevious TBI +-------+-----------+-----------+------------+------------+ Right  0.0        0.00       1.02        0.71         +-------+-----------+-----------+------------+------------+ Left   1.02       0.76       1.02        0.77         +-------+-----------+-----------+------------+------------+ Right ABIs appear decreased compared to prior study on 10/11/2021. Left ABIs appear essentially unchanged compared to prior study on 10/11/2021.  Summary: Right: Resting right ankle-brachial index indicates critical limb ischemia. The right toe-brachial index is abnormal. Left: Resting left ankle-brachial index is within normal range. The left toe-brachial index is normal. Limited imaging showed occluded fem-fem bypass graft. No flow detected in distal AT , PT , or peroneal arteries by duplex. *See table(s) above for measurements and observations.  Electronically signed by Festus Barren MD on 05/29/2022 at 2:32:15 PM.    Final        Assessment & Plan:   1. Atherosclerosis of lower extremity with claudication Today the patient's noninvasive studies are drastically improved compared to prior to his intervention.  He still continues to have some numbness however in his right lower extremity.  I discussed with the patient that this is likely a result of his ischemic episode.  He does have some noted narrowing in the popliteal area but this was also noted on his most recent angiogram.  I discussed with the patient that in order to intervene on this we will likely need to do a tibial approach during his angiogram but he notes that he does not have any significant claudication or rest pain and does not yet wish to move forward with additional intervention.  The additional concern for the patient is the cost of his Eliquis.  We discussed that because he  has such an extensive thrombus this would be best to try to prevent recurrent thrombotic episode.  Today the patient was given a months worth of samples.  We also gave the patient a 30-day Eliquis card to see if this will give him a 30-day free supply to stretch his supply.  We also gave the patient an application for assistance with the Eliquis foundation.  He is advised to follow the information and bring the required additional paperwork to the office and we will fill out our portion and fax it for him.  He understands and notes that he will bring this back soon.  We will plan on having him back in 3 months with noninvasive studies.  He is advised to follow-up sooner if he begins to notice new or worsening claudication-like symptoms.  2. Hyperlipidemia, unspecified hyperlipidemia type Continue statin as ordered and reviewed, no changes at this time  3. Tobacco use disorder Smoking cessation is advised for the patient   Current Outpatient Medications on File Prior to Visit  Medication Sig Dispense Refill   apixaban (ELIQUIS) 5 MG TABS tablet Take 1 tablet (5 mg total) by mouth 2 (two) times daily. 60 tablet 6   aspirin EC 81 MG tablet Take 1 tablet (81 mg total) by mouth daily. Swallow whole. 30  tablet 12   atorvastatin (LIPITOR) 10 MG tablet Take 1 tablet (10 mg total) by mouth daily at 6 PM. 30 tablet 6   QUEtiapine (SEROQUEL) 50 MG tablet Take 1 tablet (50 mg total) by mouth at bedtime. 30 tablet 6   No current facility-administered medications on file prior to visit.    There are no Patient Instructions on file for this visit. No follow-ups on file.   Georgiana Spinner, NP

## 2022-07-10 LAB — VAS US ABI WITH/WO TBI
Left ABI: 1.04
Right ABI: 1.05

## 2022-07-26 ENCOUNTER — Ambulatory Visit: Payer: Medicare PPO | Admitting: Gastroenterology

## 2022-07-26 ENCOUNTER — Encounter: Payer: Self-pay | Admitting: Gastroenterology

## 2022-08-01 ENCOUNTER — Ambulatory Visit: Payer: Medicare PPO | Admitting: Gastroenterology

## 2022-08-14 ENCOUNTER — Telehealth (INDEPENDENT_AMBULATORY_CARE_PROVIDER_SITE_OTHER): Payer: Self-pay | Admitting: Nurse Practitioner

## 2022-08-14 NOTE — Telephone Encounter (Signed)
Patients wife called in stating that patient was denied for the patient assistance program for the Eliquis medication. Stating he had to pay $800 into it and the script itself would have been $600. They can afford that on their income. Wanting to know if he can be put on something else. Patient has about 4 pills left.   Please call and advise

## 2022-08-15 NOTE — Telephone Encounter (Signed)
Placed 28 days worth of samples up front for pt pickup

## 2022-08-15 NOTE — Telephone Encounter (Signed)
Missy Garzon LVM stating that pt has 2 Eliquis pills left and needs to know what to do?  Message left yesterday stating pt cannot afford this medication.  Please advise

## 2022-08-15 NOTE — Telephone Encounter (Signed)
He can come get some sample, Vette, have we heard anything about his application yet?

## 2022-08-15 NOTE — Telephone Encounter (Signed)
Spoke with pt's wife she will come by the office to pick up samples.

## 2022-09-14 ENCOUNTER — Telehealth (INDEPENDENT_AMBULATORY_CARE_PROVIDER_SITE_OTHER): Payer: Self-pay

## 2022-09-14 NOTE — Telephone Encounter (Signed)
Patient spouse left a message stating that they are  not able to afford Eliquis the cost is $600. The patient currently has only a few pills left. Also the spouse would like to know if his blood thinner could be switch for cheaper cost. Please Advise

## 2022-09-15 ENCOUNTER — Other Ambulatory Visit: Payer: Self-pay

## 2022-09-15 ENCOUNTER — Other Ambulatory Visit (INDEPENDENT_AMBULATORY_CARE_PROVIDER_SITE_OTHER): Payer: Self-pay | Admitting: Nurse Practitioner

## 2022-09-15 DIAGNOSIS — R911 Solitary pulmonary nodule: Secondary | ICD-10-CM

## 2022-09-15 MED ORDER — CLOPIDOGREL BISULFATE 75 MG PO TABS: 75.0000 mg | ORAL_TABLET | Freq: Every day | ORAL | 6 refills | Status: DC

## 2022-09-15 NOTE — Telephone Encounter (Signed)
He has been on eliquis for at least 3 months and so we can attempt to transition him to plavix.  I have sent in an RX.  We will see how his leg is when he follows up next month.

## 2022-09-15 NOTE — Telephone Encounter (Signed)
Message left on patient voicemail to return call to the office pertaining to note below

## 2022-09-18 NOTE — Telephone Encounter (Signed)
Patient spouse return call to the office and she was notified that Clopidogrel 75 mg has been sent to pharmacy. Patient spouse verbalized understanding with medical recommendations .

## 2022-09-25 ENCOUNTER — Other Ambulatory Visit (INDEPENDENT_AMBULATORY_CARE_PROVIDER_SITE_OTHER): Payer: Self-pay | Admitting: Vascular Surgery

## 2022-09-25 DIAGNOSIS — I739 Peripheral vascular disease, unspecified: Secondary | ICD-10-CM

## 2022-10-02 ENCOUNTER — Ambulatory Visit: Payer: Medicare PPO | Admitting: Urology

## 2022-10-04 ENCOUNTER — Ambulatory Visit (INDEPENDENT_AMBULATORY_CARE_PROVIDER_SITE_OTHER): Payer: Medicare PPO

## 2022-10-04 ENCOUNTER — Ambulatory Visit (INDEPENDENT_AMBULATORY_CARE_PROVIDER_SITE_OTHER): Payer: Medicare PPO | Admitting: Nurse Practitioner

## 2022-10-04 ENCOUNTER — Encounter (INDEPENDENT_AMBULATORY_CARE_PROVIDER_SITE_OTHER): Payer: Self-pay | Admitting: Nurse Practitioner

## 2022-10-04 VITALS — BP 132/68 | HR 60 | Resp 16 | Wt 124.2 lb

## 2022-10-04 DIAGNOSIS — Z9889 Other specified postprocedural states: Secondary | ICD-10-CM | POA: Diagnosis not present

## 2022-10-04 DIAGNOSIS — I70219 Atherosclerosis of native arteries of extremities with intermittent claudication, unspecified extremity: Secondary | ICD-10-CM

## 2022-10-04 DIAGNOSIS — F17200 Nicotine dependence, unspecified, uncomplicated: Secondary | ICD-10-CM

## 2022-10-04 DIAGNOSIS — E785 Hyperlipidemia, unspecified: Secondary | ICD-10-CM

## 2022-10-04 DIAGNOSIS — I739 Peripheral vascular disease, unspecified: Secondary | ICD-10-CM | POA: Diagnosis not present

## 2022-10-04 NOTE — Progress Notes (Signed)
Subjective:    Patient ID: Jerry [name], male    DOB: 17-Jan-1946, 77 y.o.   MRN: [id number] Chief Complaint  Patient presents with   Follow-up    Ultrasound follow up    The patient returns to the office for followup and review status post angiogram with intervention on 05/26/2022.   Procedure: Procedure(s) Performed:             1.  Abdominal aortogram             2.  Introduction catheter into aorta bilateral femoral approach              3.  Mechanical thrombectomy of the right external iliac artery and right common iliac artery             4.  Angioplasty of the right external iliac artery to 8 mm             5.  Angioplasty of the right common iliac artery to 8 mm             6.  Angioplasty of the right common femoral to 7 mm with a Lutonix drug-eluting balloon             7.  StarClose left common femoral manual pressure held on the right common femoral  Additional angiogram done on 05/31/2022 however no intervention was done at that time as occlusion was unable to be crossed, but it was noted 75 to 99% stenosis of the popliteal artery which would likely require a additional angiogram with pedal approach  The patient notes improvement in the lower extremity symptoms. No interval shortening of the patient's claudication distance or rest pain symptoms. No new ulcers or wounds have occurred since the last visit.  His largest complaint is continuing numbness of the right lower extremity.  But he specifically denies having rest pain like symptoms.  He recently transition from Eliquis to Plavix due to being unable to afford Eliquis.  He attempted to go through multiple patient care avenues to see about reduced cost but was ultimately unsuccessful.  There have been no significant changes to the patient's overall health care.  No documented history of amaurosis fugax or recent TIA symptoms. There are no recent neurological changes noted. No documented history of DVT, PE or superficial  thrombophlebitis. The patient denies recent episodes of angina or shortness of breath.   ABI's Rt=0.91 and Lt=0.89 (previous ABI's Rt=1.05 and Lt=1.04) The patient also continues to smoke daily    Review of Systems  Neurological:  Positive for numbness.  All other systems reviewed and are negative.      Objective:   Physical Exam Vitals reviewed.  HENT:     Head: Normocephalic.  Cardiovascular:     Rate and Rhythm: Normal rate.     Pulses:          Dorsalis pedis pulses are detected w/ Doppler on the right side and detected w/ Doppler on the left side.       Posterior tibial pulses are detected w/ Doppler on the right side and detected w/ Doppler on the left side.  Pulmonary:     Effort: Pulmonary effort is normal.  Skin:    General: Skin is warm and dry.  Neurological:     Mental Status: He is alert and oriented to person, place, and time.  Psychiatric:        Mood and Affect: Mood normal.  Behavior: Behavior normal.        Thought Content: Thought content normal.        Judgment: Judgment normal.     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)    Cigarette smoker    Hearing loss    Hyperlipidemia    Leg DVT (deep venous thromboembolism), acute, left (HCC)    Peripheral vascular disease (HCC)    blockages in both extremities   Vitamin D deficiency    patient unaware of this diagnosis   Wears dentures    Full upper, Partial lower    Social History   Socioeconomic History   Marital status: Married    Spouse name: Not on file   Number of children: 6   Years of education: Not on file   Highest education level: Not on file  Occupational History   Not on file  Tobacco  Use   Smoking status: Every Day    Packs/day: 0.50    Years: 40.00    Additional pack years: 0.00    Total pack years: 20.00    Types: Cigarettes   Smokeless tobacco: Never   Tobacco comments:       Vaping Use   Vaping Use: Never used  Substance and Sexual Activity   Alcohol use: Never   Drug use: Never   Sexual activity: Not Currently  Other Topics Concern   Not on file  Social History Narrative   Not on file   Social Determinants of Health   Financial Resource Strain: Medium Risk (04/17/2022)   Overall Financial Resource Strain (CARDIA)    Difficulty of Paying Living Expenses: Somewhat hard  Food Insecurity: No Food Insecurity (05/31/2022)   Hunger Vital Sign    Worried About Running Out of Food in the Last Year: Never true    Ran Out of Food in the Last Year: Never true  Recent Concern: Food Insecurity - Food Insecurity Present (04/17/2022)   Hunger Vital Sign    Worried About Running Out of Food in the Last Year: Sometimes true    Ran Out of Food in the Last Year: Sometimes true  Transportation Needs: No Transportation Needs (05/31/2022)   PRAPARE - Administrator, Civil Service (Medical): No    Lack of Transportation (Non-Medical): No  Physical Activity: Not on file  Stress: Not on file  Social Connections: Not on file  Intimate Partner Violence: Not At Risk (05/31/2022)   Humiliation, Afraid, Rape, and Kick questionnaire    Fear of Current or Ex-Partner: No    Emotionally Abused: No    Physically Abused: No    Sexually Abused: No    Past Surgical History:  Procedure Laterality Date   ABDOMINAL AORTOGRAM W/LOWER EXTREMITY Right 05/31/2022   Procedure: ABDOMINAL AORTOGRAM W/LOWER EXTREMITY;  Surgeon: Annice Needy, MD;  Location: [facility];  Service: Cardiovascular;  Laterality: Right;   ANGIOPLASTY ILLIAC ARTERY Left 10/03/2017   Procedure: ANGIOPLASTY ILIAC ARTERY;  Surgeon: Annice Needy, MD;  Location: [facility];  Service: Vascular;  Laterality:  Left;   BACK SURGERY  1990  Three sx in lower back. metal in lower back   CATARACT EXTRACTION W/PHACO Left 08/17/2021   Procedure: CATARACT EXTRACTION PHACO AND INTRAOCULAR LENS PLACEMENT (IOC) LEFT 3.48 00:30.9;  Surgeon: Nevada Crane, MD;  Location: [facility];  Service: Ophthalmology;  Laterality: Left;   CATARACT EXTRACTION W/PHACO Right 09/12/2021   Procedure: CATARACT EXTRACTION PHACO AND INTRAOCULAR LENS PLACEMENT (IOC) RIGHT 5.74 00:42.3;  Surgeon: Nevada Crane, MD;  Location: [facility];  Service: Ophthalmology;  Laterality: Right;   COLONOSCOPY  01/2010   ENDARTERECTOMY FEMORAL Left 10/03/2017   Procedure: ENDARTERECTOMY FEMORAL;  Surgeon: Annice Needy, MD;  Location: [facility];  Service: Vascular;  Laterality: Left;   Left Leg stent Left 2009   fem fem bypass   LOWER EXTREMITY ANGIOGRAPHY Left 09/19/2017   Procedure: LOWER EXTREMITY ANGIOGRAPHY;  Surgeon: Annice Needy, MD;  Location: [facility];  Service: Cardiovascular;  Laterality: Left;   LOWER EXTREMITY ANGIOGRAPHY Right 01/07/2018   Procedure: LOWER EXTREMITY ANGIOGRAPHY;  Surgeon: Annice Needy, MD;  Location: [facility];  Service: Cardiovascular;  Laterality: Right;   LOWER EXTREMITY ANGIOGRAPHY Right 05/26/2022   Procedure: Lower Extremity Angiography;  Surgeon: Renford Dills, MD;  Location: [facility];  Service: Cardiovascular;  Laterality: Right;   PTCA Right 08/2017   VASCULAR SURGERY Left 2009   fem fem bypass     Family History  Problem Relation Age of Onset   Colon cancer Neg Hx     No Known Allergies     Latest Ref Rng & Units 05/29/2022    5:10 AM 05/28/2022    4:17 AM 05/27/2022    6:40 AM  CBC  WBC 4.0 - 10.5 K/uL 5.8  6.4  5.7   Hemoglobin 13.0 - 17.0 g/dL 16.1  09.6  04.5   Hematocrit 39.0 - 52.0 % 38.6  36.7  39.0   Platelets 150 - 400 K/uL 194  183  180       CMP     Component Value Date/Time   NA 135 05/29/2022 0510   NA 139  07/18/2011 0934   K 4.0 05/29/2022 0510   K 4.4 07/18/2011 0934   CL 105 05/29/2022 0510   CL 103 07/18/2011 0934   CO2 21 (L) 05/29/2022 0510   CO2 25 07/18/2011 0934   GLUCOSE 96 05/29/2022 0510   GLUCOSE 183 (H) 07/18/2011 0934   BUN 21 05/29/2022 0510   BUN 23 (H) 07/18/2011 0934   CREATININE 1.05 05/29/2022 0510   CREATININE 1.47 (H) 07/18/2011 0934   CALCIUM 9.4 05/29/2022 0510   CALCIUM 10.1 07/18/2011 0934   PROT 7.8 04/17/2022 0952   PROT 9.1 (H) 07/18/2011 0934   ALBUMIN 4.5 04/17/2022 0952   ALBUMIN 5.1 (H) 07/18/2011 0934   AST 19 04/17/2022 0952   AST 27 07/18/2011 0934   ALT 9 04/17/2022 0952   ALT 19 07/18/2011 0934   ALKPHOS 46 04/17/2022 0952   ALKPHOS 66 07/18/2011 0934   BILITOT 1.1 04/17/2022 0952   BILITOT 1.2 (H) 07/18/2011 0934   GFRNONAA >60 05/29/2022 0510   GFRNONAA 49 (L) 07/18/2011 0934   GFRAA >60 01/04/2018 1021   GFRAA 56 (L) 07/18/2011 0934     VAS [place] ABI WITH/WO TBI  Result Date: 05/29/2022  LOWER EXTREMITY DOPPLER STUDY Patient Name:  Jerry [name]  Date of Exam:   05/26/2022 Medical Rec #: [id number]     Accession #:    [id number]  Date of Birth: [birth year]    Patient Gender: M Patient Age:   40 years Exam Location:  [facility] Procedure:      VAS [place] ABI WITH/WO TBI Referring Phys: [facility]umber --------------------------------------------------------------------------------  Indications: Claudication, rest pain, and peripheral artery disease. High Risk Factors: Hyperlipidemia, current smoker. Other Factors: 5 days of right foot pain and numbness.  Vascular Interventions: History of fem-fem BPG with subsequent occlusion per                         previous duplex & angiogram;                         09/19/17: Right EIA angioplasty;                         10/03/17: Bilateral proximal CIA kissing stents with                         additional bilateral CIA & left EIA stents;. Performing Technologist: Hardie Lora RVT  Examination  Guidelines: A complete evaluation includes at minimum, Doppler waveform signals and systolic blood pressure reading at the level of bilateral brachial, anterior tibial, and posterior tibial arteries, when vessel segments are accessible. Bilateral testing is considered an integral part of a complete examination. Photoelectric Plethysmograph (PPG) waveforms and toe systolic pressure readings are included as required and additional duplex testing as needed. Limited examinations for reoccurring indications may be performed as noted.  ABI Findings: +---------+------------------+-----+--------+--------+ Right    Rt Pressure (mmHg)IndexWaveformComment  +---------+------------------+-----+--------+--------+ Brachial 120                                     +---------+------------------+-----+--------+--------+ PTA      0                 0.00 absent           +---------+------------------+-----+--------+--------+ PERO                            absent           +---------+------------------+-----+--------+--------+ DP       0                 0.00 absent           +---------+------------------+-----+--------+--------+ Great Toe0                 0.00                  +---------+------------------+-----+--------+--------+ +---------+------------------+-----+----------+-------+ Left     Lt Pressure (mmHg)IndexWaveform  Comment +---------+------------------+-----+----------+-------+ Brachial 123                                      +---------+------------------+-----+----------+-------+ PTA      125               1.02 triphasic         +---------+------------------+-----+----------+-------+ DP       105               0.85 monophasic        +---------+------------------+-----+----------+-------+ Beaulah Corin  0.76                   +---------+------------------+-----+----------+-------+ +-------+-----------+-----------+------------+------------+  ABI/TBIToday's ABIToday's TBIPrevious ABIPrevious TBI +-------+-----------+-----------+------------+------------+ Right  0.0        0.00       1.02        0.71         +-------+-----------+-----------+------------+------------+ Left   1.02       0.76       1.02        0.77         +-------+-----------+-----------+------------+------------+ Right ABIs appear decreased compared to prior study on 10/11/2021. Left ABIs appear essentially unchanged compared to prior study on 10/11/2021.  Summary: Right: Resting right ankle-brachial index indicates critical limb ischemia. The right toe-brachial index is abnormal. Left: Resting left ankle-brachial index is within normal range. The left toe-brachial index is normal. Limited imaging showed occluded fem-fem bypass graft. No flow detected in distal AT , PT , or peroneal arteries by duplex. *See table(s) above for measurements and observations.  Electronically signed by Festus Barren MD on 05/29/2022 at 2:32:15 PM.    Final        Assessment & Plan:   1. Atherosclerosis of lower extremity with claudication Today the patient's noninvasive studies have shown some decrease but overall he does not have any worsening claudication or rest pain like symptoms.  He does have continued numbness but I suspect that this is related to underlying nerve damage following his prolonged ischemia.  Patient will continue with Plavix and aspirin.  He is highly suggested to stop smoking as continued vascular events could result in a limb threatening situation.  Patient is also advised to continue with statin.  Will maintain close follow-up and have the patient return in 3 months or sooner if issues arise.  2. Hyperlipidemia, unspecified hyperlipidemia type Continue statin as ordered and reviewed, no changes at this time  3. Tobacco use disorder Smoking cessation is advised for the patient   Current Outpatient Medications on File Prior to Visit  Medication Sig Dispense  Refill   aspirin EC 81 MG tablet Take 1 tablet (81 mg total) by mouth daily. Swallow whole. 30 tablet 12   atorvastatin (LIPITOR) 10 MG tablet Take 1 tablet (10 mg total) by mouth daily at 6 PM. 30 tablet 6   clopidogrel (PLAVIX) 75 MG tablet Take 1 tablet (75 mg total) by mouth daily. 30 tablet 6   QUEtiapine (SEROQUEL) 50 MG tablet Take 1 tablet (50 mg total) by mouth at bedtime. 30 tablet 6   No current facility-administered medications on file prior to visit.    There are no Patient Instructions on file for this visit. No follow-ups on file.   Georgiana Spinner, NP

## 2022-10-06 ENCOUNTER — Encounter: Payer: Self-pay | Admitting: Urology

## 2022-10-19 LAB — VAS US ABI WITH/WO TBI: Right ABI: 0.91

## 2022-10-20 ENCOUNTER — Ambulatory Visit (INDEPENDENT_AMBULATORY_CARE_PROVIDER_SITE_OTHER): Payer: Medicare PPO | Admitting: Nurse Practitioner

## 2022-10-20 ENCOUNTER — Encounter (INDEPENDENT_AMBULATORY_CARE_PROVIDER_SITE_OTHER): Payer: Medicare PPO

## 2022-11-30 ENCOUNTER — Encounter: Payer: Self-pay | Admitting: Physician Assistant

## 2022-11-30 ENCOUNTER — Telehealth: Payer: Self-pay

## 2022-11-30 ENCOUNTER — Ambulatory Visit (INDEPENDENT_AMBULATORY_CARE_PROVIDER_SITE_OTHER): Payer: Medicare PPO | Admitting: Physician Assistant

## 2022-11-30 VITALS — BP 131/75 | HR 61 | Temp 97.7°F | Ht 65.0 in | Wt 124.2 lb

## 2022-11-30 DIAGNOSIS — K625 Hemorrhage of anus and rectum: Secondary | ICD-10-CM

## 2022-11-30 DIAGNOSIS — I2510 Atherosclerotic heart disease of native coronary artery without angina pectoris: Secondary | ICD-10-CM

## 2022-11-30 DIAGNOSIS — K5900 Constipation, unspecified: Secondary | ICD-10-CM | POA: Diagnosis not present

## 2022-11-30 DIAGNOSIS — I739 Peripheral vascular disease, unspecified: Secondary | ICD-10-CM | POA: Diagnosis not present

## 2022-11-30 DIAGNOSIS — K5904 Chronic idiopathic constipation: Secondary | ICD-10-CM

## 2022-11-30 DIAGNOSIS — I70219 Atherosclerosis of native arteries of extremities with intermittent claudication, unspecified extremity: Secondary | ICD-10-CM

## 2022-11-30 DIAGNOSIS — F17210 Nicotine dependence, cigarettes, uncomplicated: Secondary | ICD-10-CM

## 2022-11-30 MED ORDER — POLYETHYLENE GLYCOL 3350 17 GM/SCOOP PO POWD: ORAL | Status: AC

## 2022-11-30 MED ORDER — PEG 3350-KCL-NA BICARB-NACL 420 G PO SOLR
4000.0000 mL | Freq: Once | ORAL | 0 refills | Status: AC
Start: 2022-11-30 — End: 2022-11-30

## 2022-11-30 NOTE — Telephone Encounter (Signed)
Form faxed for medical records to Dr.Toledo at [facility].

## 2022-11-30 NOTE — Progress Notes (Signed)
Celso Amy, PA-C [address]  [address]  [place], [place] [address]  Main: [phone]  Fax: [phone]   Gastroenterology Consultation  Referring Provider:     Leanna Sato, MD Primary Care Physician:  Leanna Sato, MD Primary Gastroenterologist:  Celso Amy, PA-C / Dr. Wyline Mood   Reason for Consultation:     Rectal bleeding        HPI:   Jerry [name] is a 77 y.o. y/o male referred for consultation & management  by Leanna Sato, MD.    Patient states he had an episode of bright red rectal bleeding 3 weeks ago.  Had 2 or 3 episodes of bright red blood on the tissue and in the toilet after bowel movement.  After that rectal bleeding resolved.  He reports having hard stool daily.  No current treatment for constipation.  He denies rectal pain, abdominal pain, or weight loss.  He states he had a colonoscopy 2 or 3 years ago in [place].  He cannot remember where or any results.  Records are not available.  Review of records show he was evaluated at [facility] in 2022.  He was started on stool softener and scheduled for a screening colonoscopy, however procedure was not done.  He has history of PAD and CAD.  Currently on Plavix and aspirin daily.  Colonoscopy 01/2010 was normal.  No family history of colon cancer.  Last lab 05/29/2022 showed normal hemoglobin 13.1, platelet 194.  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)    Cigarette smoker    Hearing loss    Hyperlipidemia    Leg DVT (deep venous thromboembolism), acute, left (HCC)    Peripheral vascular disease (HCC)    blockages in both extremities   Vitamin D deficiency    patient unaware of this diagnosis   Wears dentures    Full upper, Partial lower    Past Surgical History:  Procedure Laterality Date    ABDOMINAL AORTOGRAM W/LOWER EXTREMITY Right 05/31/2022   Procedure: ABDOMINAL AORTOGRAM W/LOWER EXTREMITY;  Surgeon: Annice Needy, MD;  Location: [facility];  Service: Cardiovascular;  Laterality: Right;   ANGIOPLASTY ILLIAC ARTERY Left 10/03/2017   Procedure: ANGIOPLASTY ILIAC ARTERY;  Surgeon: Annice Needy, MD;  Location: [facility];  Service: Vascular;  Laterality: Left;   BACK SURGERY  1990   Three sx in lower back. metal in lower back   CATARACT EXTRACTION W/PHACO Left 08/17/2021   Procedure: CATARACT EXTRACTION PHACO AND INTRAOCULAR LENS PLACEMENT (IOC) LEFT 3.48 00:30.9;  Surgeon: Nevada Crane, MD;  Location: [facility];  Service: Ophthalmology;  Laterality: Left;   CATARACT EXTRACTION W/PHACO Right 09/12/2021   Procedure: CATARACT EXTRACTION PHACO AND INTRAOCULAR LENS PLACEMENT (IOC) RIGHT 5.74 00:42.3;  Surgeon: Nevada Crane, MD;  Location: [facility];  Service: Ophthalmology;  Laterality: Right;   COLONOSCOPY  01/2010   ENDARTERECTOMY FEMORAL Left 10/03/2017   Procedure: ENDARTERECTOMY FEMORAL;  Surgeon: Annice Needy, MD;  Location: [facility];  Service: Vascular;  Laterality: Left;   Left Leg stent Left 2009   fem fem bypass   LOWER EXTREMITY ANGIOGRAPHY Left 09/19/2017   Procedure: LOWER EXTREMITY ANGIOGRAPHY;  Surgeon: Annice Needy, MD;  Location: [facility];  Service: Cardiovascular;  Laterality: Left;   LOWER EXTREMITY ANGIOGRAPHY Right 01/07/2018   Procedure: LOWER EXTREMITY ANGIOGRAPHY;  Surgeon: Annice Needy, MD;  Location: [facility];  Service: Cardiovascular;  Laterality: Right;   LOWER EXTREMITY ANGIOGRAPHY Right 05/26/2022   Procedure: Lower Extremity Angiography;  Surgeon: Renford Dills, MD;  Location: [facility];  Service: Cardiovascular;  Laterality: Right;   PTCA Right 08/2017   VASCULAR SURGERY Left 2009   fem fem bypass     Prior to Admission medications   Medication Sig Start Date End Date Taking?  Authorizing Provider  aspirin EC 81 MG tablet Take 1 tablet (81 mg total) by mouth daily. Swallow whole. 06/01/22  Yes Pace, Brien R, NP  atorvastatin (LIPITOR) 10 MG tablet Take 1 tablet (10 mg total) by mouth daily at 6 PM. 06/01/22  Yes Pace, Brien R, NP  clopidogrel (PLAVIX) 75 MG tablet Take 1 tablet (75 mg total) by mouth daily. 09/15/22  Yes Georgiana Spinner, NP  QUEtiapine (SEROQUEL) 50 MG tablet Take 1 tablet (50 mg total) by mouth at bedtime. 06/01/22  Yes Pace, Peggye Ley, NP    Family History  Problem Relation Age of Onset   Colon cancer Neg Hx      Social History   Tobacco Use   Smoking status: Every Day    Current packs/day: 0.50    Average packs/day: 0.5 packs/day for 40.0 years (20.0 ttl pk-yrs)    Types: Cigarettes   Smokeless tobacco: Never   Tobacco comments:       Vaping Use   Vaping status: Never Used  Substance Use Topics   Alcohol use: Never   Drug use: Never    Allergies as of 11/30/2022   (No Known Allergies)    Review of Systems:    All systems reviewed and negative except where noted in HPI.   Physical Exam:  BP 131/75   Pulse 61   Temp 97.7 F (36.5 C)   Ht 5\' 5"  (1.651 m)   Wt 124 lb 3.2 oz (56.3 kg)   BMI 20.67 kg/m  No LMP for male patient.  General:   Alert,  Well-developed, well-nourished, pleasant and cooperative in NAD Lungs:  Respirations even and unlabored.  Clear throughout to auscultation.   No wheezes, crackles, or rhonchi. No acute distress. Heart:  Regular rate and rhythm; no murmurs, clicks, rubs, or gallops. Abdomen:  Normal bowel sounds.  No bruits.  Soft, and non-distended without masses, hepatosplenomegaly or hernias noted.  No Tenderness.  No guarding or rebound tenderness.    Neurologic:  Alert and oriented x3;  grossly normal neurologically. Psych:  Alert and cooperative. Normal mood and affect. Rectal: No external hemorrhoids.  No rectal masses, tenderness, or anal fissures.  Small hard ball of stool in the vault.  No gross  blood.  Imaging Studies: No results found.  Assessment and Plan:   Jerry [name] is a 77 y.o. y/o male has been referred for:  Rectal Bleeding  Lab: CBC  Scheduling Colonoscopy I discussed risks of colonoscopy with patient to include risk of bleeding, colon perforation, and risk of sedation.  Patient expressed understanding and agrees to proceed with colonoscopy.   Constipation Start MiraLAX powder, mix 1 capful in a drink once daily every day. Drink 64 ounces of water daily and eat high-fiber diet with fruits and vegetables.  PAD & CAD - On Plavix & Aspirin Requesting permission to hold Plavix 5 days prior  to colonoscopy.  Follow up in 3 months with TG.  Celso Amy, PA-C

## 2022-11-30 NOTE — Patient Instructions (Addendum)
Plavix Clearance faxed to [facility] to stop 5 days prior to procedure.  Please take OTC MiraLAX powder, mix 1 capful in a drink every day to treat constipation.

## 2022-12-01 ENCOUNTER — Telehealth: Payer: Self-pay

## 2022-12-01 LAB — CBC WITH DIFFERENTIAL/PLATELET
Basophils Absolute: 0.1 10*3/uL (ref 0.0–0.2)
Basos: 1 %
EOS (ABSOLUTE): 0.4 10*3/uL (ref 0.0–0.4)
Eos: 6 %
Hematocrit: 42.9 % (ref 37.5–51.0)
Hemoglobin: 14.7 g/dL (ref 13.0–17.7)
Immature Grans (Abs): 0.1 10*3/uL (ref 0.0–0.1)
Immature Granulocytes: 1 %
Lymphocytes Absolute: 1.8 10*3/uL (ref 0.7–3.1)
Lymphs: 27 %
MCH: 31.2 pg (ref 26.6–33.0)
MCHC: 34.3 g/dL (ref 31.5–35.7)
MCV: 91 fL (ref 79–97)
Monocytes Absolute: 0.6 10*3/uL (ref 0.1–0.9)
Monocytes: 8 %
Neutrophils Absolute: 3.9 10*3/uL (ref 1.4–7.0)
Neutrophils: 57 %
Platelets: 258 10*3/uL (ref 150–450)
RBC: 4.71 x10E6/uL (ref 4.14–5.80)
RDW: 13.3 % (ref 11.6–15.4)
WBC: 6.8 10*3/uL (ref 3.4–10.8)

## 2022-12-01 NOTE — Telephone Encounter (Signed)
Left message for patient to return call to office.  Call and notify patient CBC is normal.  White count and hemoglobin are normal.  No evidence of infection or anemia.  Continue with current plan for colonoscopy as scheduled.

## 2022-12-01 NOTE — Progress Notes (Signed)
Call and notify patient CBC is normal.  White count and hemoglobin are normal.  No evidence of infection or anemia.  Continue with current plan for colonoscopy as scheduled.

## 2022-12-07 ENCOUNTER — Telehealth: Payer: Self-pay

## 2022-12-07 NOTE — Telephone Encounter (Signed)
Refaxed Plavix clearance to [facility].

## 2022-12-07 NOTE — Telephone Encounter (Signed)
Received Plavix clearance from [facility] -hold Plavix 5 days prior to procedure.  Spoke with patient and he was instructed to Hold Plavix on 01-04-23  and to resume 1 day after procedure.

## 2022-12-25 ENCOUNTER — Other Ambulatory Visit (INDEPENDENT_AMBULATORY_CARE_PROVIDER_SITE_OTHER): Payer: Self-pay | Admitting: Nurse Practitioner

## 2022-12-25 MED ORDER — ATORVASTATIN CALCIUM 10 MG PO TABS: 10.0000 mg | ORAL_TABLET | Freq: Every day | ORAL | 3 refills | Status: DC

## 2023-01-02 ENCOUNTER — Encounter: Payer: Self-pay | Admitting: Gastroenterology

## 2023-01-08 ENCOUNTER — Other Ambulatory Visit (INDEPENDENT_AMBULATORY_CARE_PROVIDER_SITE_OTHER): Payer: Self-pay | Admitting: Nurse Practitioner

## 2023-01-08 DIAGNOSIS — I739 Peripheral vascular disease, unspecified: Secondary | ICD-10-CM

## 2023-01-09 ENCOUNTER — Encounter (INDEPENDENT_AMBULATORY_CARE_PROVIDER_SITE_OTHER): Payer: Medicare PPO

## 2023-01-09 ENCOUNTER — Ambulatory Visit: Admission: RE | Admit: 2023-01-09 | Payer: Medicare PPO | Source: Home / Self Care | Admitting: Gastroenterology

## 2023-01-09 ENCOUNTER — Encounter: Admission: RE | Payer: Self-pay | Source: Home / Self Care

## 2023-01-09 ENCOUNTER — Ambulatory Visit (INDEPENDENT_AMBULATORY_CARE_PROVIDER_SITE_OTHER): Payer: Medicare PPO | Admitting: Nurse Practitioner

## 2023-01-09 SURGERY — COLONOSCOPY WITH PROPOFOL
Anesthesia: General

## 2023-02-28 NOTE — Progress Notes (Signed)
Celso Amy, PA-C [address]  [address]  [place], [place] [address]  Main: [phone]  Fax: [phone]   Primary Care Physician: Leanna Sato, MD  Primary Gastroenterologist:  Celso Amy, PA-C / Dr. Wyline Mood    CC: F/U rectal bleeding and constipation  HPI: Jerry [name] is a 77 y.o. male returns for 34-month follow-up of rectal bleeding and constipation.  He was scheduled for a colonoscopy 12/2022, however he forgot about the procedure.  He states he has poor memory.  He lives with his wife Jerry [name] who helps him.  She is not here today.  3 months ago was recommended to start on MiraLAX daily, however he never started MiraLAX.  Not currently taking any treatment for constipation.  He continues to have small hard bowel movements every few days.  He has not had any recent episodes of rectal bleeding.  Admits to 5 to 10 pound weight loss in the past few months.  Denies abdominal pain, heartburn, or dysphagia.  Lab 11/30/2022 showed normal hemoglobin 14.7.  No anemia.    History of PAD, CAD, DVT.  Currently on Plavix and aspirin.  Asymptomatic.  Last colonoscopy 01/2010 was normal.  No family history of colon cancer.  Current Outpatient Medications  Medication Sig Dispense Refill   aspirin EC 81 MG tablet Take 1 tablet (81 mg total) by mouth daily. Swallow whole. 30 tablet 12   atorvastatin (LIPITOR) 10 MG tablet Take 1 tablet (10 mg total) by mouth daily at 6 PM. 90 tablet 3   clopidogrel (PLAVIX) 75 MG tablet Take 1 tablet (75 mg total) by mouth daily. 30 tablet 6   polyethylene glycol (GOLYTELY) 236 g solution Take 4,000 mLs by mouth once for 1 dose. 4000 mL 0   polyethylene glycol powder (GLYCOLAX/MIRALAX) 17 GM/SCOOP powder Mix 1 capful in a Drink Once daily for Constipation. (Patient not taking: Reported on 03/01/2023)     QUEtiapine (SEROQUEL) 50 MG tablet Take 1 tablet (50 mg total) by mouth at bedtime. (Patient not taking: Reported on 03/01/2023) 30 tablet 6   No  current facility-administered medications for this visit.    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)    Cigarette smoker    Hearing loss    Hyperlipidemia    Leg DVT (deep venous thromboembolism), acute, left (HCC)    Peripheral vascular disease (HCC)    blockages in both extremities   Vitamin D deficiency    patient unaware of this diagnosis   Wears dentures    Full upper, Partial lower    Past Surgical History:  Procedure Laterality Date   ABDOMINAL AORTOGRAM W/LOWER EXTREMITY Right 05/31/2022   Procedure: ABDOMINAL AORTOGRAM W/LOWER EXTREMITY;  Surgeon: Annice Needy, MD;  Location: [facility];  Service: Cardiovascular;  Laterality: Right;   ANGIOPLASTY ILLIAC ARTERY Left 10/03/2017   Procedure: ANGIOPLASTY ILIAC ARTERY;  Surgeon: Annice Needy, MD;  Location: [facility];  Service: Vascular;  Laterality: Left;   BACK SURGERY  1990   Three sx in lower back. metal in lower back   CATARACT EXTRACTION W/PHACO Left 08/17/2021   Procedure: CATARACT EXTRACTION PHACO AND INTRAOCULAR LENS PLACEMENT (IOC) LEFT 3.48 00:30.9;  Surgeon: Nevada Crane, MD;  Location: [facility];  Service: Ophthalmology;  Laterality: Left;   CATARACT EXTRACTION W/PHACO Right 09/12/2021   Procedure: CATARACT EXTRACTION PHACO AND INTRAOCULAR LENS PLACEMENT (IOC) RIGHT 5.74 00:42.3;  Surgeon: Nevada Crane, MD;  Location: [facility];  Service: Ophthalmology;  Laterality: Right;   COLONOSCOPY  01/2010   ENDARTERECTOMY FEMORAL Left 10/03/2017   Procedure: ENDARTERECTOMY FEMORAL;  Surgeon: Annice Needy, MD;  Location: [facility];  Service: Vascular;  Laterality: Left;   Left Leg stent Left 2009   fem fem bypass   LOWER EXTREMITY ANGIOGRAPHY Left 09/19/2017    Procedure: LOWER EXTREMITY ANGIOGRAPHY;  Surgeon: Annice Needy, MD;  Location: [facility];  Service: Cardiovascular;  Laterality: Left;   LOWER EXTREMITY ANGIOGRAPHY Right 01/07/2018   Procedure: LOWER EXTREMITY ANGIOGRAPHY;  Surgeon: Annice Needy, MD;  Location: [facility];  Service: Cardiovascular;  Laterality: Right;   LOWER EXTREMITY ANGIOGRAPHY Right 05/26/2022   Procedure: Lower Extremity Angiography;  Surgeon: Renford Dills, MD;  Location: [facility];  Service: Cardiovascular;  Laterality: Right;   PTCA Right 08/2017   VASCULAR SURGERY Left 2009   fem fem bypass     Review of Systems:    All systems reviewed and negative except where noted in HPI.   Physical Examination:   BP 138/66 (BP Location: Left Arm, Patient Position: Sitting, Cuff Size: Normal)   Pulse 63   Temp 97.6 F (36.4 C) (Oral)   Ht 5\' 5"  (1.651 m)   Wt 129 lb 4 oz (58.6 kg) Comment: Had jacket on  BMI 21.51 kg/m   General: Well-nourished, well-developed in no acute distress.  Lungs: Clear to auscultation bilaterally. Non-labored. Heart: Regular rate and rhythm, no murmurs rubs or gallops.  Abdomen: Bowel sounds are normal; Abdomen is Soft; No hepatosplenomegaly, masses or hernias;  No Abdominal Tenderness; No guarding or rebound tenderness. Neuro: Alert and oriented x 3.  Grossly intact.  Mild memory impairment is noted. Psych: Alert and cooperative, normal mood and affect.   Imaging Studies: No results found.  Assessment and Plan:   Jerry [name] is a 77 y.o. y/o male returns for follow-up of:  1.  Rectal bleeding -no recent episodes.  Normal hemoglobin 14 g.  No anemia.  Scheduling Colonoscopy I discussed risks of colonoscopy with patient to include risk of bleeding, colon perforation, and risk of sedation.  Patient expressed understanding and agrees to proceed with colonoscopy.   I explained colonoscopy procedure, risks / benefits at length verbally with patient.   We will also call his wife (per patient request) and notify her of procedure date.  Printed instructions were given to patient today.  He is advised to take his wife with him to all doctor appointments in the future.  2.  Chronic constipation; persistent  Start MiraLAX powder mix 1 capful in a drink every day.  Recommend high-fiber diet and 64 ounces of fluids daily.  3.  History of CAD, PAD, and DVT; currently on Plavix and aspirin daily.  Hold Plavix 5 days prior to colonoscopy.  Stay on aspirin.  Celso Amy, PA-C  Follow up as needed based on colonoscopy results and GI symptoms.

## 2023-03-01 ENCOUNTER — Telehealth: Payer: Self-pay

## 2023-03-01 ENCOUNTER — Ambulatory Visit: Payer: Medicare PPO | Admitting: Physician Assistant

## 2023-03-01 ENCOUNTER — Other Ambulatory Visit: Payer: Self-pay

## 2023-03-01 ENCOUNTER — Encounter: Payer: Self-pay | Admitting: Physician Assistant

## 2023-03-01 VITALS — BP 138/66 | HR 63 | Temp 97.6°F | Ht 65.0 in | Wt 129.2 lb

## 2023-03-01 DIAGNOSIS — K5909 Other constipation: Secondary | ICD-10-CM | POA: Diagnosis not present

## 2023-03-01 DIAGNOSIS — K625 Hemorrhage of anus and rectum: Secondary | ICD-10-CM

## 2023-03-01 DIAGNOSIS — K5904 Chronic idiopathic constipation: Secondary | ICD-10-CM

## 2023-03-01 MED ORDER — GOLYTELY 236 G PO SOLR: 4000.0000 mL | Freq: Once | ORAL | 0 refills | Status: AC

## 2023-03-01 NOTE — Telephone Encounter (Signed)
Per Inetta Fermo patient has memory loss and can not read he states. He agreed that we could call his wife to go over the instructions for colonoscopy. Called patient wife and left a message for call back

## 2023-03-01 NOTE — Patient Instructions (Addendum)
Take Miralax every day.  See printed Colonoscopy Instructions.

## 2023-03-01 NOTE — Telephone Encounter (Signed)
Patient wife verbalized understanding of date of colonoscopy and colonoscopy instructions. She states she will get the instructions from him and make sure he does them correctly

## 2023-03-20 ENCOUNTER — Other Ambulatory Visit (INDEPENDENT_AMBULATORY_CARE_PROVIDER_SITE_OTHER): Payer: Self-pay | Admitting: Nurse Practitioner

## 2023-03-20 NOTE — Telephone Encounter (Signed)
Patient needs to call and make an appointment

## 2023-03-27 ENCOUNTER — Telehealth (INDEPENDENT_AMBULATORY_CARE_PROVIDER_SITE_OTHER): Payer: Self-pay

## 2023-03-27 NOTE — Telephone Encounter (Signed)
 Spoke with the patient and his niece and spouse regarding a refill on Seroquel . Per Gwendlyn Shank NP the patient is to follow up with his PCP for the Seroquel  as that is a medication that requires lab work. Patient was advised to contact his PCP to get a refill and he understood.

## 2023-04-12 ENCOUNTER — Encounter: Payer: Self-pay | Admitting: Gastroenterology

## 2023-04-13 ENCOUNTER — Ambulatory Visit: Payer: Medicare PPO | Admitting: Anesthesiology

## 2023-04-13 ENCOUNTER — Encounter: Payer: Self-pay | Admitting: Gastroenterology

## 2023-04-13 ENCOUNTER — Ambulatory Visit
Admission: RE | Admit: 2023-04-13 | Discharge: 2023-04-13 | Disposition: A | Discharge status: Home / Self Care | Payer: Medicare PPO | Attending: Gastroenterology | Admitting: Gastroenterology

## 2023-04-13 ENCOUNTER — Encounter
Admission: RE | Disposition: A | Discharge status: Home / Self Care | Payer: Self-pay | Source: Home / Self Care | Attending: Gastroenterology

## 2023-04-13 DIAGNOSIS — D125 Benign neoplasm of sigmoid colon: Secondary | ICD-10-CM | POA: Diagnosis not present

## 2023-04-13 DIAGNOSIS — Z5941 Food insecurity: Secondary | ICD-10-CM | POA: Insufficient documentation

## 2023-04-13 DIAGNOSIS — K625 Hemorrhage of anus and rectum: Secondary | ICD-10-CM | POA: Insufficient documentation

## 2023-04-13 DIAGNOSIS — D122 Benign neoplasm of ascending colon: Secondary | ICD-10-CM | POA: Insufficient documentation

## 2023-04-13 DIAGNOSIS — J449 Chronic obstructive pulmonary disease, unspecified: Secondary | ICD-10-CM | POA: Insufficient documentation

## 2023-04-13 DIAGNOSIS — I739 Peripheral vascular disease, unspecified: Secondary | ICD-10-CM | POA: Insufficient documentation

## 2023-04-13 DIAGNOSIS — F17210 Nicotine dependence, cigarettes, uncomplicated: Secondary | ICD-10-CM | POA: Insufficient documentation

## 2023-04-13 DIAGNOSIS — Z5986 Financial insecurity: Secondary | ICD-10-CM | POA: Diagnosis not present

## 2023-04-13 DIAGNOSIS — K635 Polyp of colon: Secondary | ICD-10-CM | POA: Insufficient documentation

## 2023-04-13 DIAGNOSIS — K5520 Angiodysplasia of colon without hemorrhage: Secondary | ICD-10-CM | POA: Diagnosis not present

## 2023-04-13 DIAGNOSIS — K627 Radiation proctitis: Secondary | ICD-10-CM

## 2023-04-13 DIAGNOSIS — D126 Benign neoplasm of colon, unspecified: Secondary | ICD-10-CM

## 2023-04-13 HISTORY — PX: POLYPECTOMY: SHX5525

## 2023-04-13 HISTORY — PX: HOT HEMOSTASIS: SHX5433

## 2023-04-13 HISTORY — PX: COLONOSCOPY WITH PROPOFOL: SHX5780

## 2023-04-13 SURGERY — COLONOSCOPY WITH PROPOFOL
Anesthesia: General

## 2023-04-13 MED ORDER — EPHEDRINE SULFATE-NACL 50-0.9 MG/10ML-% IV SOSY
PREFILLED_SYRINGE | INTRAVENOUS | Status: DC | PRN
  Administered 2023-04-13: 5 mg via INTRAVENOUS

## 2023-04-13 MED ORDER — LIDOCAINE HCL (PF) 2 % IJ SOLN
INTRAMUSCULAR | Status: AC
  Filled 2023-04-13: qty 5

## 2023-04-13 MED ORDER — SODIUM CHLORIDE 0.9 % IV SOLN
INTRAVENOUS | Status: DC
  Administered 2023-04-13: 500 mL via INTRAVENOUS

## 2023-04-13 MED ORDER — LIDOCAINE HCL (CARDIAC) PF 100 MG/5ML IV SOSY
PREFILLED_SYRINGE | INTRAVENOUS | Status: DC | PRN
  Administered 2023-04-13: 50 mg via INTRAVENOUS

## 2023-04-13 MED ORDER — PROPOFOL 500 MG/50ML IV EMUL
INTRAVENOUS | Status: DC | PRN
  Administered 2023-04-13: 50 mg via INTRAVENOUS
  Administered 2023-04-13: 100 ug/kg/min via INTRAVENOUS
  Administered 2023-04-13 (×2): 50 mg via INTRAVENOUS

## 2023-04-13 MED ORDER — ONDANSETRON HCL 4 MG/2ML IJ SOLN
INTRAMUSCULAR | Status: DC | PRN
  Administered 2023-04-13: 4 mg via INTRAVENOUS

## 2023-04-13 MED ORDER — PROPOFOL 10 MG/ML IV BOLUS
INTRAVENOUS | Status: AC
Start: 2023-04-13 — End: ?
  Filled 2023-04-13: qty 40

## 2023-04-13 MED ORDER — FENTANYL CITRATE (PF) 100 MCG/2ML IJ SOLN
INTRAMUSCULAR | Status: AC
  Filled 2023-04-13: qty 2

## 2023-04-13 MED ORDER — EPHEDRINE 5 MG/ML INJ
INTRAVENOUS | Status: AC
  Filled 2023-04-13: qty 5

## 2023-04-13 MED ORDER — ONDANSETRON HCL 4 MG/2ML IJ SOLN
INTRAMUSCULAR | Status: AC
  Filled 2023-04-13: qty 2

## 2023-04-13 NOTE — Anesthesia Postprocedure Evaluation (Signed)
Anesthesia Post Note  Patient: Jerry [name]  Procedure(s) Performed: COLONOSCOPY WITH PROPOFOL HOT HEMOSTASIS (ARGON PLASMA COAGULATION/BICAP) POLYPECTOMY  Patient location during evaluation: PACU Anesthesia Type: General Level of consciousness: awake and awake and alert Pain management: satisfactory to patient Vital Signs Assessment: post-procedure vital signs reviewed and stable Respiratory status: spontaneous breathing and nonlabored ventilation Cardiovascular status: blood pressure returned to baseline Anesthetic complications: no   No notable events documented.   Last Vitals:  Vitals:   04/13/23 0708 04/13/23 0824  BP: (!) 143/69 110/60  Pulse: (!) 53 (!) 54  Resp: 16 14  Temp: (!) 35.6 C   SpO2: 98% 100%    Last Pain:  Vitals:   04/13/23 0824  TempSrc:   PainSc: 0-No pain                 VAN STAVEREN,Xander Jutras

## 2023-04-13 NOTE — Op Note (Signed)
[facility] Patient Name: Jerry [name] Procedure Date: 04/13/2023 7:18 AM MRN: [id number] Account #: [id number] Date of Birth: [birth year] Admit Type: Outpatient Age: 78 Room: [facility] ENDO ROOM 4 Gender: Male Note Status: Finalized Instrument Name: Nelda Marseille [id number] Procedure:             Colonoscopy Indications:           Rectal bleeding Providers:             Wyline Mood MD, MD Referring MD:          Leanna Sato, MD (Referring MD) Medicines:             Monitored Anesthesia Care Complications:         No immediate complications. Procedure:             Pre-Anesthesia Assessment:                        - Prior to the procedure, a History and Physical was                         performed, and patient medications, allergies and                         sensitivities were reviewed. The patient's tolerance                         of previous anesthesia was reviewed.                        - The risks and benefits of the procedure and the                         sedation options and risks were discussed with the                         patient. All questions were answered and informed                         consent was obtained.                        - ASA Grade Assessment: II - A patient with mild                         systemic disease.                        After obtaining informed consent, the colonoscope was                         passed under direct vision. Throughout the procedure,                         the patient's blood pressure, pulse, and oxygen                         saturations were monitored continuously. The                         Colonoscope was introduced through the  anus and                         advanced to the the cecum, identified by the                         appendiceal orifice. The colonoscopy was performed                         with ease. The patient tolerated the procedure well.                         The  quality of the bowel preparation was excellent.                         The ileocecal valve, appendiceal orifice, and rectum                         were photographed. Findings:      The perianal and digital rectal examinations were normal.      A 3 mm polyp was found in the ascending colon. The polyp was sessile.       The polyp was removed with a jumbo cold forceps. Resection and retrieval       were complete.      Two sessile polyps were found in the sigmoid colon. The polyps were 4 to       5 mm in size. These polyps were removed with a cold snare. Resection and       retrieval were complete.      Multiple large localized angioectasias without bleeding were found in       the mid rectum. Coagulation for bleeding prevention using argon plasma       at 0.5 liters/minute and 20 watts was successful.      The exam was otherwise without abnormality on direct and retroflexion       views. Impression:            - One 3 mm polyp in the ascending colon, removed with                         a jumbo cold forceps. Resected and retrieved.                        - Two 4 to 5 mm polyps in the sigmoid colon, removed                         with a cold snare. Resected and retrieved.                        - Multiple non-bleeding colonic angioectasias. Treated                         with argon plasma coagulation (APC).                        - The examination was otherwise normal on direct and                         retroflexion views. Recommendation:        -  Discharge patient to home (with escort).                        - Resume previous diet.                        - Continue present medications.                        - Await pathology results.                        - Repeat colonoscopy is not recommended due to current                         age (3 years or older) for surveillance.                        - restart plavix in 3 days Procedure Code(s):     --- Professional ---                         [phone], 59, Colonoscopy, flexible; with control of                         bleeding, any method                        45385, Colonoscopy, flexible; with removal of                         tumor(s), polyp(s), or other lesion(s) by snare                         technique                        45380, 59, Colonoscopy, flexible; with biopsy, single                         or multiple Diagnosis Code(s):     --- Professional ---                        D12.2, Benign neoplasm of ascending colon                        D12.5, Benign neoplasm of sigmoid colon                        K55.20, Angiodysplasia of colon without hemorrhage                        K62.5, Hemorrhage of anus and rectum CPT copyright 2022 American Medical Association. All rights reserved. The codes documented in this report are preliminary and upon coder review may  be revised to meet current compliance requirements. Wyline Mood, MD Wyline Mood MD, MD 04/13/2023 8:21:30 AM This report has been signed electronically. Number of Addenda: 0 Note Initiated On: 04/13/2023 7:18 AM Scope Withdrawal Time: 0 hours 13 minutes 37 seconds  Total Procedure Duration: 0 hours 18 minutes 22 seconds  Estimated Blood Loss:  Estimated blood loss: none.  [facility]

## 2023-04-13 NOTE — H&P (Signed)
Wyline Mood, MD [address], [place], [place], [address] [address], [address], [place], [place], [address] Phone: [phone]  Fax: [phone]  Primary Care Physician:  Leanna Sato, MD   Pre-Procedure History & Physical: HPI:  Jerry [name] is a 78 y.o. male is here for an colonoscopy.   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)    Cigarette smoker    Hearing loss    Hyperlipidemia    Leg DVT (deep venous thromboembolism), acute, left (HCC)    Peripheral vascular disease (HCC)    blockages in both extremities   Vitamin D deficiency    patient unaware of this diagnosis   Wears dentures    Full upper, Partial lower    Past Surgical History:  Procedure Laterality Date   ABDOMINAL AORTOGRAM W/LOWER EXTREMITY Right 05/31/2022   Procedure: ABDOMINAL AORTOGRAM W/LOWER EXTREMITY;  Surgeon: Annice Needy, MD;  Location: [facility];  Service: Cardiovascular;  Laterality: Right;   ANGIOPLASTY ILLIAC ARTERY Left 10/03/2017   Procedure: ANGIOPLASTY ILIAC ARTERY;  Surgeon: Annice Needy, MD;  Location: [facility];  Service: Vascular;  Laterality: Left;   BACK SURGERY  1990   Three sx in lower back. metal in lower back   CATARACT EXTRACTION W/PHACO Left 08/17/2021   Procedure: CATARACT EXTRACTION PHACO AND INTRAOCULAR LENS PLACEMENT (IOC) LEFT 3.48 00:30.9;  Surgeon: Nevada Crane, MD;  Location: [facility];  Service: Ophthalmology;  Laterality: Left;   CATARACT EXTRACTION W/PHACO Right 09/12/2021   Procedure: CATARACT EXTRACTION PHACO AND INTRAOCULAR LENS PLACEMENT (IOC) RIGHT 5.74 00:42.3;  Surgeon: Nevada Crane, MD;  Location: [facility];  Service: Ophthalmology;  Laterality: Right;   COLONOSCOPY  01/2010   ENDARTERECTOMY FEMORAL Left 10/03/2017    Procedure: ENDARTERECTOMY FEMORAL;  Surgeon: Annice Needy, MD;  Location: [facility];  Service: Vascular;  Laterality: Left;   EYE SURGERY     Left Leg stent Left 2009   fem fem bypass   LOWER EXTREMITY ANGIOGRAPHY Left 09/19/2017   Procedure: LOWER EXTREMITY ANGIOGRAPHY;  Surgeon: Annice Needy, MD;  Location: [facility];  Service: Cardiovascular;  Laterality: Left;   LOWER EXTREMITY ANGIOGRAPHY Right 01/07/2018   Procedure: LOWER EXTREMITY ANGIOGRAPHY;  Surgeon: Annice Needy, MD;  Location: [facility];  Service: Cardiovascular;  Laterality: Right;   LOWER EXTREMITY ANGIOGRAPHY Right 05/26/2022   Procedure: Lower Extremity Angiography;  Surgeon: Renford Dills, MD;  Location: [facility];  Service: Cardiovascular;  Laterality: Right;   PTCA Right 08/2017   VASCULAR SURGERY Left 2009   fem fem bypass     Prior to Admission medications   Medication Sig Start Date End Date Taking? Authorizing Provider  aspirin EC 81 MG tablet Take 1 tablet (81 mg total) by mouth daily. Swallow whole. 06/01/22  Yes Pace, Brien R, NP  atorvastatin (LIPITOR) 10 MG tablet Take 1 tablet (10 mg total) by mouth daily at 6 PM. 12/25/22  Yes Georgiana Spinner, NP  clopidogrel (PLAVIX) 75 MG tablet TAKE 1 TABLET BY MOUTH EVERY DAY 03/20/23   Georgiana Spinner, NP  polyethylene glycol powder (GLYCOLAX/MIRALAX) 17 GM/SCOOP powder Mix 1 capful in a Drink Once daily for Constipation. Patient not taking:  Reported on 03/01/2023 11/30/22   Celso Amy, PA-C  QUEtiapine (SEROQUEL) 50 MG tablet Take 1 tablet (50 mg total) by mouth at bedtime. Patient not taking: Reported on 03/01/2023 06/01/22   Marcie Bal, NP    Allergies as of 03/01/2023   (No Known Allergies)    Family History  Problem Relation Age of Onset   Colon cancer Neg Hx     Social History   Socioeconomic History   Marital status: Married    Spouse name: Not on file   Number of children: 6   Years of education: Not on file    Highest education level: Not on file  Occupational History   Not on file  Tobacco Use   Smoking status: Every Day    Current packs/day: 0.50    Average packs/day: 0.5 packs/day for 40.0 years (20.0 ttl pk-yrs)    Types: Cigarettes   Smokeless tobacco: Never   Tobacco comments:       Vaping Use   Vaping status: Never Used  Substance and Sexual Activity   Alcohol use: Never   Drug use: Never   Sexual activity: Not Currently  Other Topics Concern   Not on file  Social History Narrative   Not on file   Social Drivers of Health   Financial Resource Strain: Medium Risk (04/17/2022)   Overall Financial Resource Strain (CARDIA)    Difficulty of Paying Living Expenses: Somewhat hard  Food Insecurity: No Food Insecurity (05/31/2022)   Hunger Vital Sign    Worried About Running Out of Food in the Last Year: Never true    Ran Out of Food in the Last Year: Never true  Recent Concern: Food Insecurity - Food Insecurity Present (04/17/2022)   Hunger Vital Sign    Worried About Running Out of Food in the Last Year: Sometimes true    Ran Out of Food in the Last Year: Sometimes true  Transportation Needs: No Transportation Needs (05/31/2022)   PRAPARE - Administrator, Civil Service (Medical): No    Lack of Transportation (Non-Medical): No  Physical Activity: Not on file  Stress: Not on file  Social Connections: Not on file  Intimate Partner Violence: Not At Risk (05/31/2022)   Humiliation, Afraid, Rape, and Kick questionnaire    Fear of Current or Ex-Partner: No    Emotionally Abused: No    Physically Abused: No    Sexually Abused: No    Review of Systems: See HPI, otherwise negative ROS  Physical Exam: BP (!) 143/69   Pulse (!) 53   Temp (!) 96 F (35.6 C) (Temporal)   Resp 16   Ht 5\' 5"  (1.651 m)   Wt 59.1 kg   SpO2 98%   BMI 21.68 kg/m  General:   Alert,  pleasant and cooperative in NAD Head:  Normocephalic and atraumatic. Neck:  Supple; no masses or  thyromegaly. Lungs:  Clear throughout to auscultation, normal respiratory effort.    Heart:  +S1, +S2, Regular rate and rhythm, No edema. Abdomen:  Soft, nontender and nondistended. Normal bowel sounds, without guarding, and without rebound.   Neurologic:  Alert and  oriented x4;  grossly normal neurologically.  Impression/Plan: Jerry [name] is here for an colonoscopy to be performed for Screening colonoscopy rectal bleeding  Risks, benefits, limitations, and alternatives regarding  colonoscopy have been reviewed with the patient.  Questions have been answered.  All parties agreeable.   Wyline Mood, MD  04/13/2023, 7:52 AM

## 2023-04-13 NOTE — Anesthesia Preprocedure Evaluation (Addendum)
Anesthesia Evaluation  Patient identified by MRN, date of birth, ID band Patient awake    Reviewed: Allergy & Precautions, NPO status , Patient's Chart, lab work & pertinent test results  Airway Mallampati: II  TM Distance: >3 FB Neck ROM: full    Dental  (+) Upper Dentures   Pulmonary COPD, Current SmokerPatient did not abstain from smoking.   Pulmonary exam normal  + decreased breath sounds      Cardiovascular Exercise Tolerance: Poor + Peripheral Vascular Disease  negative cardio ROS Normal cardiovascular exam Rhythm:Regular Rate:Normal     Neuro/Psych negative neurological ROS  negative psych ROS   GI/Hepatic negative GI ROS, Neg liver ROS,,,  Endo/Other  negative endocrine ROS    Renal/GU negative Renal ROS  negative genitourinary   Musculoskeletal   Abdominal Normal abdominal exam  (+)   Peds negative pediatric ROS (+)  Hematology negative hematology ROS (+)   Anesthesia Other Findings Past Medical History: No date: Abdominal wall mass of right lower quadrant No date: Abnormal finding on EKG No date: Anemia     Comment:  patient unaware of this diagnosis No date: 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) No date: Cigarette smoker No date: Hearing loss No date: Hyperlipidemia No date: Leg DVT (deep venous thromboembolism), acute, left (HCC) No date: Peripheral vascular disease (HCC)     Comment:  blockages in both extremities No date: Vitamin D deficiency     Comment:  patient unaware of this diagnosis No date: Wears dentures     Comment:  Full upper, Partial lower  Past Surgical History: 05/31/2022: ABDOMINAL AORTOGRAM W/LOWER EXTREMITY; Right     Comment:  Procedure: ABDOMINAL AORTOGRAM W/LOWER EXTREMITY;                Surgeon: Annice Needy, MD;  Location: [facility]               [facility];  Service: Cardiovascular;  Laterality: Right; 10/03/2017: ANGIOPLASTY ILLIAC ARTERY;  Left     Comment:  Procedure: ANGIOPLASTY ILIAC ARTERY;  Surgeon: Annice Needy, MD;  Location: [facility];  Service: Vascular;                Laterality: Left; 1990: BACK SURGERY     Comment:  Three sx in lower back. metal in lower back 08/17/2021: CATARACT EXTRACTION W/PHACO; Left     Comment:  Procedure: CATARACT EXTRACTION PHACO AND INTRAOCULAR               LENS PLACEMENT (IOC) LEFT 3.48 00:30.9;  Surgeon: Nevada Crane, MD;  Location: [facility];                Service: Ophthalmology;  Laterality: Left; 09/12/2021: CATARACT EXTRACTION W/PHACO; Right     Comment:  Procedure: CATARACT EXTRACTION PHACO AND INTRAOCULAR               LENS PLACEMENT (IOC) RIGHT 5.74 00:42.3;  Surgeon: Nevada Crane, MD;  Location: [facility];                Service: Ophthalmology;  Laterality: Right; 01/2010: COLONOSCOPY 10/03/2017: ENDARTERECTOMY FEMORAL; Left     Comment:  Procedure: ENDARTERECTOMY FEMORAL;  Surgeon: Festus Barren  S, MD;  Location: [facility];  Service: Vascular;                Laterality: Left; 2009: Left Leg stent; Left     Comment:  fem fem bypass 09/19/2017: LOWER EXTREMITY ANGIOGRAPHY; Left     Comment:  Procedure: LOWER EXTREMITY ANGIOGRAPHY;  Surgeon: Annice Needy, MD;  Location: [facility];  Service:               Cardiovascular;  Laterality: Left; 01/07/2018: LOWER EXTREMITY ANGIOGRAPHY; Right     Comment:  Procedure: LOWER EXTREMITY ANGIOGRAPHY;  Surgeon: Annice Needy, MD;  Location: [facility];  Service:               Cardiovascular;  Laterality: Right; 05/26/2022: LOWER EXTREMITY ANGIOGRAPHY; Right     Comment:  Procedure: Lower Extremity Angiography;  Surgeon:               Renford Dills, MD;  Location: [facility];               Service: Cardiovascular;  Laterality: Right; 08/2017: PTCA; Right 2009: VASCULAR SURGERY; Left     Comment:   fem fem bypass      Reproductive/Obstetrics negative OB ROS                              Anesthesia Physical Anesthesia Plan  ASA: 3  Anesthesia Plan: General   Post-op Pain Management:    Induction: Intravenous  PONV Risk Score and Plan: Propofol infusion and TIVA  Airway Management Planned: Natural Airway and Nasal Cannula  Additional Equipment:   Intra-op Plan:   Post-operative Plan:   Informed Consent: I have reviewed the patients History and Physical, chart, labs and discussed the procedure including the risks, benefits and alternatives for the proposed anesthesia with the patient or authorized representative who has indicated his/her understanding and acceptance.     Dental Advisory Given  Plan Discussed with: CRNA  Anesthesia Plan Comments:         Anesthesia Quick Evaluation

## 2023-04-13 NOTE — Transfer of Care (Signed)
Immediate Anesthesia Transfer of Care Note  Patient: Jerry [name]  Procedure(s) Performed: COLONOSCOPY WITH PROPOFOL HOT HEMOSTASIS (ARGON PLASMA COAGULATION/BICAP) POLYPECTOMY  Patient Location: PACU  Anesthesia Type:MAC  Level of Consciousness: drowsy  Airway & Oxygen Therapy: Patient Spontanous Breathing  Post-op Assessment: Report given to RN and Post -op Vital signs reviewed and stable  Post vital signs: Reviewed and stable  Last Vitals:  Vitals Value Taken Time  BP 110/60 04/13/23 0824  Temp    Pulse 56 04/13/23 0824  Resp 14 04/13/23 0824  SpO2 100 % 04/13/23 0824  Vitals shown include unfiled device data.  Last Pain:  Vitals:   04/13/23 0708  TempSrc: Temporal  PainSc: 0-No pain         Complications: No notable events documented.

## 2023-04-16 ENCOUNTER — Encounter: Payer: Self-pay | Admitting: Gastroenterology

## 2023-04-16 LAB — SURGICAL PATHOLOGY

## 2023-05-03 ENCOUNTER — Ambulatory Visit: Admission: RE | Admit: 2023-05-03 | Payer: Medicare PPO | Source: Ambulatory Visit

## 2023-05-09 ENCOUNTER — Telehealth: Payer: Self-pay | Admitting: Oncology

## 2023-05-09 NOTE — Telephone Encounter (Signed)
Left pt a VM regarding missing CT scan & cancelling appt 2/13. Instructed him to call us back if he wishes to reschedule

## 2023-05-10 ENCOUNTER — Inpatient Hospital Stay: Payer: Medicare PPO | Admitting: Oncology

## 2023-06-13 ENCOUNTER — Other Ambulatory Visit (INDEPENDENT_AMBULATORY_CARE_PROVIDER_SITE_OTHER): Payer: Self-pay | Admitting: Nurse Practitioner

## 2023-06-18 ENCOUNTER — Ambulatory Visit (INDEPENDENT_AMBULATORY_CARE_PROVIDER_SITE_OTHER)

## 2023-06-18 DIAGNOSIS — Z9889 Other specified postprocedural states: Secondary | ICD-10-CM | POA: Diagnosis not present

## 2023-06-18 DIAGNOSIS — I739 Peripheral vascular disease, unspecified: Secondary | ICD-10-CM

## 2023-06-25 LAB — VAS US ABI WITH/WO TBI
Left ABI: 0.84
Right ABI: 0.82

## 2023-06-26 ENCOUNTER — Encounter (INDEPENDENT_AMBULATORY_CARE_PROVIDER_SITE_OTHER): Payer: Self-pay | Admitting: Vascular Surgery

## 2023-06-26 ENCOUNTER — Ambulatory Visit (INDEPENDENT_AMBULATORY_CARE_PROVIDER_SITE_OTHER): Admitting: Vascular Surgery

## 2023-06-26 VITALS — BP 138/84 | HR 80 | Resp 18 | Ht 65.5 in | Wt 129.0 lb

## 2023-06-26 DIAGNOSIS — I70219 Atherosclerosis of native arteries of extremities with intermittent claudication, unspecified extremity: Secondary | ICD-10-CM

## 2023-06-26 DIAGNOSIS — F17210 Nicotine dependence, cigarettes, uncomplicated: Secondary | ICD-10-CM

## 2023-06-26 DIAGNOSIS — E785 Hyperlipidemia, unspecified: Secondary | ICD-10-CM

## 2023-06-26 DIAGNOSIS — F17200 Nicotine dependence, unspecified, uncomplicated: Secondary | ICD-10-CM | POA: Diagnosis not present

## 2023-06-26 MED ORDER — CLOPIDOGREL BISULFATE 75 MG PO TABS: 75.0000 mg | ORAL_TABLET | Freq: Every day | ORAL | 6 refills | Status: AC

## 2023-06-26 MED ORDER — ATORVASTATIN CALCIUM 10 MG PO TABS: 10.0000 mg | ORAL_TABLET | Freq: Every day | ORAL | 11 refills | Status: DC

## 2023-06-26 NOTE — Progress Notes (Signed)
 MRN : [id number]  Jerry [name] is a 78 y.o. (02/14/1946) male who presents with chief complaint of  Chief Complaint  Patient presents with   Follow-up    3 month follow up with ABI & aoiliac  .  History of Present Illness: Patient returns today in follow up of his PAD.  He says his legs are currently doing okay.  He has some chronic pain in his legs, but nothing that resembles rest pain or lifestyle limiting claudication.  He continues to smoke even though he knows this is harmful to his vascular system.  Also, he has stopped taking all of his medications which includes aspirin, Plavix, and a statin agent.  He says he does not like how they make him feel.  We discussed how important it was that he take his medications.  We also discussed that that is largely the reason that he had acute limb threatening ischemia about a year ago required urgent revascularization.  His ABIs are 0.82 on the right and 0.84 on the left.  Aortoiliac duplex does not show any significant aortoiliac stenosis.  Current Outpatient Medications  Medication Sig Dispense Refill   atorvastatin (LIPITOR) 10 MG tablet Take 1 tablet (10 mg total) by mouth daily. 30 tablet 11   clopidogrel (PLAVIX) 75 MG tablet Take 1 tablet (75 mg total) by mouth daily. 30 tablet 6   aspirin EC 81 MG tablet Take 1 tablet (81 mg total) by mouth daily. Swallow whole. (Patient not taking: Reported on 06/26/2023) 30 tablet 12   atorvastatin (LIPITOR) 10 MG tablet Take 1 tablet (10 mg total) by mouth daily at 6 PM. (Patient not taking: Reported on 06/26/2023) 90 tablet 3   clopidogrel (PLAVIX) 75 MG tablet TAKE 1 TABLET BY MOUTH EVERY DAY (Patient not taking: Reported on 06/26/2023) 30 tablet 2   polyethylene glycol powder (GLYCOLAX/MIRALAX) 17 GM/SCOOP powder Mix 1 capful in a Drink Once daily for Constipation. (Patient not taking: Reported on 06/26/2023)     QUEtiapine (SEROQUEL) 50 MG tablet Take 1 tablet (50 mg total) by mouth at bedtime. 30 tablet 6    No current facility-administered medications for this visit.    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)    Cigarette smoker    Hearing loss    Hyperlipidemia    Leg DVT (deep venous thromboembolism), acute, left (HCC)    Peripheral vascular disease (HCC)    blockages in both extremities   Vitamin D deficiency    patient unaware of this diagnosis   Wears dentures    Full upper, Partial lower    Past Surgical History:  Procedure Laterality Date   ABDOMINAL AORTOGRAM W/LOWER EXTREMITY Right 05/31/2022   Procedure: ABDOMINAL AORTOGRAM W/LOWER EXTREMITY;  Surgeon: Annice Needy, MD;  Location: [facility];  Service: Cardiovascular;  Laterality: Right;   ANGIOPLASTY ILLIAC ARTERY Left 10/03/2017   Procedure: ANGIOPLASTY ILIAC ARTERY;  Surgeon: Annice Needy, MD;  Location: [facility];  Service: Vascular;  Laterality: Left;   BACK SURGERY  1990   Three sx in lower back. metal in lower back   CATARACT EXTRACTION W/PHACO Left 08/17/2021   Procedure: CATARACT EXTRACTION PHACO AND INTRAOCULAR LENS PLACEMENT (IOC) LEFT 3.48 00:30.9;  Surgeon: Nevada Crane, MD;  Location:  [facility];  Service: Ophthalmology;  Laterality: Left;   CATARACT EXTRACTION W/PHACO Right 09/12/2021   Procedure: CATARACT EXTRACTION PHACO AND INTRAOCULAR LENS PLACEMENT (IOC) RIGHT 5.74 00:42.3;  Surgeon: Nevada Crane, MD;  Location: [facility];  Service: Ophthalmology;  Laterality: Right;   COLONOSCOPY  01/2010   COLONOSCOPY WITH PROPOFOL N/A 04/13/2023   Procedure: COLONOSCOPY WITH PROPOFOL;  Surgeon: Wyline Mood, MD;  Location: [facility];  Service: Gastroenterology;  Laterality: N/A;   ENDARTERECTOMY FEMORAL Left 10/03/2017   Procedure: ENDARTERECTOMY FEMORAL;  Surgeon: Annice Needy, MD;  Location:  [facility];  Service: Vascular;  Laterality: Left;   EYE SURGERY     HOT HEMOSTASIS  04/13/2023   Procedure: HOT HEMOSTASIS (ARGON PLASMA COAGULATION/BICAP);  Surgeon: Wyline Mood, MD;  Location: [facility];  Service: Gastroenterology;;   Left Leg stent Left 2009   fem fem bypass   LOWER EXTREMITY ANGIOGRAPHY Left 09/19/2017   Procedure: LOWER EXTREMITY ANGIOGRAPHY;  Surgeon: Annice Needy, MD;  Location: [facility];  Service: Cardiovascular;  Laterality: Left;   LOWER EXTREMITY ANGIOGRAPHY Right 01/07/2018   Procedure: LOWER EXTREMITY ANGIOGRAPHY;  Surgeon: Annice Needy, MD;  Location: [facility];  Service: Cardiovascular;  Laterality: Right;   LOWER EXTREMITY ANGIOGRAPHY Right 05/26/2022   Procedure: Lower Extremity Angiography;  Surgeon: Renford Dills, MD;  Location: [facility];  Service: Cardiovascular;  Laterality: Right;   POLYPECTOMY  04/13/2023   Procedure: POLYPECTOMY;  Surgeon: Wyline Mood, MD;  Location: [facility];  Service: Gastroenterology;;   PTCA Right 08/2017   VASCULAR SURGERY Left 2009   fem fem bypass      Social History   Tobacco Use   Smoking status: Every Day    Current packs/day: 0.50    Average packs/day: 0.5 packs/day for 40.0 years (20.0 ttl pk-yrs)    Types: Cigarettes   Smokeless tobacco: Never   Tobacco comments:       Vaping Use   Vaping status: Never Used  Substance Use Topics   Alcohol use: Never   Drug use: Never      Family History  Problem Relation Age of Onset   Colon cancer Neg Hx      No Known Allergies   REVIEW OF SYSTEMS (Negative unless checked)  Constitutional: [] Weight loss  [] Fever  [] Chills Cardiac: [] Chest pain   [] Chest pressure   [] Palpitations   [] Shortness of breath when laying flat   [] Shortness of breath at rest   [] Shortness of breath with exertion. Vascular:  [x] Pain in legs with walking   [] Pain in legs at rest   [] Pain in legs when laying flat   [] Claudication   [] Pain in  feet when walking  [] Pain in feet at rest  [] Pain in feet when laying flat   [] History of DVT   [] Phlebitis   [] Swelling in legs   [] Varicose veins   [] Non-healing ulcers Pulmonary:   [] Uses home oxygen   [] Productive cough   [] Hemoptysis   [] Wheeze  [] COPD   [] Asthma Neurologic:  [] Dizziness  [] Blackouts   [] Seizures   [] History of stroke   [] History of TIA  [] Aphasia   [] Temporary blindness   [] Dysphagia   [] Weakness or numbness in arms   [] Weakness or numbness in legs Musculoskeletal:  [] Arthritis   [] Joint swelling   [x] Joint pain   [] Low back pain Hematologic:  [] Easy bruising  [] Easy bleeding   [] Hypercoagulable state   [x] Anemic   Gastrointestinal:  [] Blood in  stool   [] Vomiting blood  [] Gastroesophageal reflux/heartburn   [] Abdominal pain Genitourinary:  [] Chronic kidney disease   [] Difficult urination  [] Frequent urination  [] Burning with urination   [] Hematuria Skin:  [] Rashes   [] Ulcers   [] Wounds Psychological:  [] History of anxiety   []  History of major depression.  Physical Examination  BP 138/84   Pulse 80   Resp 18   Ht 5' 5.5" (1.664 m)   Wt 129 lb (58.5 kg)   BMI 21.14 kg/m  Gen:  WD/WN, NAD Head: [place]/AT, No temporalis wasting. Ear/Nose/Throat: Hearing grossly intact, nares w/o erythema or drainage Eyes: Conjunctiva clear. Sclera non-icteric Neck: Supple.  Trachea midline Pulmonary:  Good air movement, no use of accessory muscles.  Cardiac: RRR, no JVD Vascular:  Vessel Right Left  Radial Palpable Palpable                          PT Palpable Palpable  DP Palpable Palpable   Gastrointestinal: soft, non-tender/non-distended. No guarding/reflex.  Musculoskeletal: M/S 5/5 throughout.  No deformity or atrophy. No edema. Neurologic: Sensation grossly intact in extremities.  Symmetrical.  Speech is fluent.  Psychiatric: Judgment and insight are fair Dermatologic: No rashes or ulcers noted.  No cellulitis or open wounds.      Labs Recent Results (from the  past 2160 hours)  Surgical pathology     Status: None   Collection Time: 04/13/23 12:00 AM  Result Value Ref Range   SURGICAL PATHOLOGY      SURGICAL PATHOLOGY [facility] [address] [place], [place] [address] Telephone [phone] or [phone] Fax [phone]  REPORT OF SURGICAL PATHOLOGY   Accession #: [id number] Patient Name: LORIS, WINROW Visit # : [id number]  MRN: [id number] Physician: Wyline Mood DOB/Age 10/09/1945 (Age: 20) Gender: M Collected Date: 04/13/2023 Received Date: 04/13/2023  FINAL DIAGNOSIS       1. Ascending  Colon Polyp, cbx :       - TUBULAR ADENOMA.      - NEGATIVE FOR HIGH-GRADE DYSPLASIA AND MALIGNANCY.       2. Sigmoid  Colon Polyp, x2 cold snare :       - TUBULAR ADENOMA (1).      - HYPERPLASTIC POLYP (1).      - NEGATIVE FOR HIGH-GRADE DYSPLASIA AND MALIGNANCY.       ELECTRONIC SIGNATURE : Rubinas Md, Delice Bison , Sports administrator, Electronic Signature  MICROSCOPIC DESCRIPTION  CASE COMMENTS STAINS USED IN DIAGNOSIS: H&E H&E    CLINICAL HISTORY  SPECIMEN(S) OBTAINED 1. Ascending  Colon Polyp, Cbx 2. Sigmoid  Colon Polyp,  X2 Cold Snare  SPECIMEN COMMENTS: SPECIMEN CLINICAL INFORMATION: 1. 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 "CBX ascending colon polyp", received in formalin is a single 0.4 x 0.3 x 0.1 cm pink-tan tissue fragment.The specimen is submitted in toto in 1 block (1A). 2. "Cold snare sigmoid colon polyp x2", received in formalin are 2 tan-pink tissue fragments that are 0.7 x 0.2 x 0.1 cm and 0.4 x 0.4 x 0.1 cm.The specimen is submitted in toto in 1 block (2A).      [facility] 04/13/2023        Report signed out from the following location(s) [place] [address], [place] [address] CLIA #: [id number]  [facility] [address] [place], [place] [address] CLIA #: [id number]   VAS [place]  ABI WITH/WO TBI     Status: None   Collection Time: 06/18/23  8:15 AM  Result Value Ref Range   Right ABI 0.82    Left ABI 0.84     Radiology VAS [place] ABI WITH/WO TBI Result Date: 06/25/2023  LOWER EXTREMITY DOPPLER STUDY Patient Name:  DUANNE DUCHESNE  Date of Exam:   06/18/2023 Medical Rec #: [id number]     Accession #:    [id number] Date of Birth: 05-20-1945    Patient Gender: M Patient Age:   65 years Exam Location:  [facility] Procedure:      VAS [place] ABI WITH/WO TBI Referring Phys: Sheppard Plumber --------------------------------------------------------------------------------  Indications: Claudication, rest pain, and peripheral artery disease. High Risk Factors: Hyperlipidemia, current smoker.  Vascular Interventions: 05/31/2022 angiogram showed 80-90% popliteal artery                         stenosis.                         05/26/2022                         Bilateral common iliac artery and right external iliac                         artery angioplasty                         History of fem-fem BPG with subsequent occlusion per                         previous duplex & angiogram;                         09/19/17: Right EIA angioplasty;                         10/03/17: Bilateral proximal CIA kissing stents with                         additional bilateral CIA & left EIA stents;. Performing Technologist: Hardie Lora RVT  Examination Guidelines: A complete evaluation includes at minimum, Doppler waveform signals and systolic blood pressure reading at the level of bilateral brachial, anterior tibial, and posterior tibial arteries, when vessel segments are accessible. Bilateral testing is considered an integral part of a complete examination. Photoelectric Plethysmograph (PPG) waveforms and toe systolic pressure readings are included as required and additional duplex testing as needed. Limited examinations for reoccurring indications may be performed as noted.  ABI Findings:  +---------+------------------+-----+--------+--------+ Right    Rt Pressure (mmHg)IndexWaveformComment  +---------+------------------+-----+--------+--------+ Brachial 147                                     +---------+------------------+-----+--------+--------+ PTA      117               0.73 biphasic         +---------+------------------+-----+--------+--------+ DP       132               0.82 biphasic         +---------+------------------+-----+--------+--------+ Gwynneth Munson  0.65                  +---------+------------------+-----+--------+--------+ +---------+------------------+-----+---------+-------+ Left     Lt Pressure (mmHg)IndexWaveform Comment +---------+------------------+-----+---------+-------+ Brachial 161                                     +---------+------------------+-----+---------+-------+ PTA      136               0.84 triphasic        +---------+------------------+-----+---------+-------+ DP       119               0.74 biphasic         +---------+------------------+-----+---------+-------+ Great Toe103               0.64                  +---------+------------------+-----+---------+-------+ +-------+-----------+-----------+------------+------------+ ABI/TBIToday's ABIToday's TBIPrevious ABIPrevious TBI +-------+-----------+-----------+------------+------------+ Right  0.82       0.65       0.91        0.67         +-------+-----------+-----------+------------+------------+ Left   0.84       0.64       0.89        0.78         +-------+-----------+-----------+------------+------------+ Bilateral ABIs appear essentially unchanged compared to prior study on 10/04/2022.  Summary: Right: Resting right ankle-brachial index indicates mild right lower extremity arterial disease. The right toe-brachial index is abnormal. Left: Resting left ankle-brachial index indicates mild left lower extremity arterial  disease. The left toe-brachial index is abnormal. *See table(s) above for measurements and observations.  Electronically signed by Levora Dredge MD on 06/25/2023 at 2:30:16 PM.    Final    VAS US AORTA/IVC/ILIACS Result Date: 06/25/2023 ABDOMINAL AORTA STUDY Patient Name:  SERJIO DEUPREE  Date of Exam:   06/18/2023 Medical Rec #: [id number]     Accession #:    [id number] Date of Birth: [birth year]    Patient Gender: M Patient Age:   65 years Exam Location:  [facility] Procedure:      VAS US AORTA/IVC/ILIACS Referring Phys: Sheppard Plumber --------------------------------------------------------------------------------  Risk Factors: Hyperlipidemia, current smoker. Vascular Interventions: 05/31/2022 angiogram showed 80-90% popliteal artery                         stenosis.                         05/26/2022                         Bilateral common iliac artery and right external iliac                         artery angioplasty                         History of fem-fem BPG with subsequent occlusion per                         previous duplex & angiogram;                         09/19/17:  Right EIA angioplasty;                         10/03/17: Bilateral proximal CIA kissing stents with                         additional bilateral CIA & left EIA stents;.  Performing Technologist: Hardie Lora RVT  Examination Guidelines: A complete evaluation includes B-mode imaging, spectral Doppler, color Doppler, and power Doppler as needed of all accessible portions of each vessel. Bilateral testing is considered an integral part of a complete examination. Limited examinations for reoccurring indications may be performed as noted.  Abdominal Aorta Findings: +-------------+-------+----------+----------+---------+--------+--------+ Location     AP (cm)Trans (cm)PSV (cm/s)Waveform ThrombusComments +-------------+-------+----------+----------+---------+--------+--------+ Proximal     2.17   2.31      68                                   +-------------+-------+----------+----------+---------+--------+--------+ Mid                           48                                  +-------------+-------+----------+----------+---------+--------+--------+ RT CIA Prox                   108       biphasic                  +-------------+-------+----------+----------+---------+--------+--------+ RT CIA Mid                    96        biphasic                  +-------------+-------+----------+----------+---------+--------+--------+ RT CIA Distal                 91        biphasic                  +-------------+-------+----------+----------+---------+--------+--------+ RT EIA Prox                   121       triphasic                 +-------------+-------+----------+----------+---------+--------+--------+ RT EIA Mid                    102       biphasic                  +-------------+-------+----------+----------+---------+--------+--------+ RT EIA Distal                 94        biphasic                  +-------------+-------+----------+----------+---------+--------+--------+ LT CIA Prox                   93        biphasic                  +-------------+-------+----------+----------+---------+--------+--------+ LT CIA Mid                    94  biphasic                  +-------------+-------+----------+----------+---------+--------+--------+ LT CIA Distal                 110       biphasic                  +-------------+-------+----------+----------+---------+--------+--------+ LT EIA Prox                   82        biphasic                  +-------------+-------+----------+----------+---------+--------+--------+ LT EIA Mid                    87        biphasic                  +-------------+-------+----------+----------+---------+--------+--------+ LT EIA Distal                 93        biphasic                   +-------------+-------+----------+----------+---------+--------+--------+  Summary: Stenosis: +--------------------+-----------+ Location            Stent       +--------------------+-----------+ Right Common Iliac  no stenosis +--------------------+-----------+ Left Common Iliac   no stenosis +--------------------+-----------+ Right External Iliacno stenosis +--------------------+-----------+ Left External Iliac no stenosis +--------------------+-----------+   *See table(s) above for measurements and observations.  Electronically signed by Levora Dredge MD on 06/25/2023 at 2:29:46 PM.    Final     Assessment/Plan  Hyperlipidemia lipid control important in reducing the progression of atherosclerotic disease.  Patient is apparently not taking his statin therapy which we would recommend for reduction of risk of progression of atherosclerosis.   Tobacco use disorder Certainly represents an atherosclerotic risk factor with increased risk of progression of disease in his vascular system.  Smoking cessation would be of great benefit.  We discussed this for 3 to 4 minutes and discussed options for quitting.  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  Aortoiliac duplex does not show any significant aortoiliac stenosis.  Despite his noncompliance and continued smoking, his perfusion remains pretty good.  I have strongly recommended he stop smoking.  I recommended initiating Plavix and a statin agent at a minimum.  I have discussed that he is at high risk of thrombosis and limb threatening ischemia with his noncompliance.  I will plan on a follow-up visit in 6 months or sooner if problems develop in the interim.    Festus Barren, MD  06/26/2023 1:24 PM    This note was created with Dragon medical transcription system.  Any errors from dictation are purely unintentional

## 2023-06-26 NOTE — Assessment & Plan Note (Signed)
 His ABIs are 0.82 on the right and 0.84 on the left.  Aortoiliac duplex does not show any significant aortoiliac stenosis.  Despite his noncompliance and continued smoking, his perfusion remains pretty good.  I have strongly recommended he stop smoking.  I recommended initiating Plavix and a statin agent at a minimum.  I have discussed that he is at high risk of thrombosis and limb threatening ischemia with his noncompliance.  I will plan on a follow-up visit in 6 months or sooner if problems develop in the interim.

## 2023-06-26 NOTE — Assessment & Plan Note (Signed)
 Certainly represents an atherosclerotic risk factor with increased risk of progression of disease in his vascular system.  Smoking cessation would be of great benefit.  We discussed this for 3 to 4 minutes and discussed options for quitting.

## 2023-06-26 NOTE — Assessment & Plan Note (Signed)
 lipid control important in reducing the progression of atherosclerotic disease.  Patient is apparently not taking his statin therapy which we would recommend for reduction of risk of progression of atherosclerosis.

## 2023-08-14 ENCOUNTER — Encounter (INDEPENDENT_AMBULATORY_CARE_PROVIDER_SITE_OTHER): Payer: Self-pay

## 2023-11-18 ENCOUNTER — Other Ambulatory Visit (INDEPENDENT_AMBULATORY_CARE_PROVIDER_SITE_OTHER): Payer: Self-pay | Admitting: Nurse Practitioner

## 2023-12-28 ENCOUNTER — Ambulatory Visit (INDEPENDENT_AMBULATORY_CARE_PROVIDER_SITE_OTHER): Admitting: Vascular Surgery

## 2023-12-28 ENCOUNTER — Encounter (INDEPENDENT_AMBULATORY_CARE_PROVIDER_SITE_OTHER)

## 2024-04-14 ENCOUNTER — Other Ambulatory Visit (INDEPENDENT_AMBULATORY_CARE_PROVIDER_SITE_OTHER): Payer: Self-pay | Admitting: Nurse Practitioner

## 2024-04-14 MED ORDER — ATORVASTATIN CALCIUM 10 MG PO TABS: 10.0000 mg | ORAL_TABLET | Freq: Every day | ORAL | 11 refills | Status: AC
